# Patient Record
Sex: Male | Born: 1935
Health system: Southern US, Community
[De-identification: ages and names within clinical notes are randomized; demographics above are authoritative.]

## PROBLEM LIST (undated history)

## (undated) DIAGNOSIS — C4492 Squamous cell carcinoma of skin, unspecified: Secondary | ICD-10-CM

## (undated) DIAGNOSIS — R011 Cardiac murmur, unspecified: Secondary | ICD-10-CM

## (undated) DIAGNOSIS — M25552 Pain in left hip: Secondary | ICD-10-CM

## (undated) DIAGNOSIS — I350 Nonrheumatic aortic (valve) stenosis: Secondary | ICD-10-CM

## (undated) DIAGNOSIS — C44310 Basal cell carcinoma of skin of unspecified parts of face: Secondary | ICD-10-CM

## (undated) DIAGNOSIS — Z87442 Personal history of urinary calculi: Secondary | ICD-10-CM

## (undated) DIAGNOSIS — M1990 Unspecified osteoarthritis, unspecified site: Secondary | ICD-10-CM

## (undated) DIAGNOSIS — I2510 Atherosclerotic heart disease of native coronary artery without angina pectoris: Secondary | ICD-10-CM

## (undated) DIAGNOSIS — M545 Low back pain, unspecified: Secondary | ICD-10-CM

## (undated) DIAGNOSIS — C4491 Basal cell carcinoma of skin, unspecified: Secondary | ICD-10-CM

## (undated) DIAGNOSIS — R0600 Dyspnea, unspecified: Secondary | ICD-10-CM

## (undated) HISTORY — DX: Low back pain, unspecified: M54.50

## (undated) HISTORY — DX: Basal cell carcinoma of skin of unspecified parts of face: C44.310

## (undated) HISTORY — DX: Cardiac murmur, unspecified: R01.1

## (undated) HISTORY — PX: EYE SURGERY: SHX253

## (undated) HISTORY — DX: Low back pain: M54.5

## (undated) HISTORY — DX: Atherosclerotic heart disease of native coronary artery without angina pectoris: I25.10

## (undated) HISTORY — DX: Nonrheumatic aortic (valve) stenosis: I35.0

## (undated) HISTORY — PX: JOINT REPLACEMENT: SHX530

## (undated) HISTORY — DX: Pain in left hip: M25.552

---
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
## 1989-04-07 DIAGNOSIS — C4491 Basal cell carcinoma of skin, unspecified: Secondary | ICD-10-CM

## 1989-04-07 HISTORY — DX: Basal cell carcinoma of skin, unspecified: C44.91

## 2005-03-26 ENCOUNTER — Encounter: Admission: RE | Admit: 2005-03-26 | Discharge: 2005-03-26 | Payer: Self-pay | Admitting: Neurosurgery

## 2010-10-28 ENCOUNTER — Ambulatory Visit (HOSPITAL_COMMUNITY)
Admission: RE | Admit: 2010-10-28 | Discharge: 2010-10-28 | Payer: Self-pay | Source: Home / Self Care | Attending: Ophthalmology | Admitting: Ophthalmology

## 2010-11-30 ENCOUNTER — Ambulatory Visit (HOSPITAL_COMMUNITY)
Admission: RE | Admit: 2010-11-30 | Discharge: 2010-11-30 | Payer: Self-pay | Source: Home / Self Care | Attending: Ophthalmology | Admitting: Ophthalmology

## 2011-01-18 LAB — BASIC METABOLIC PANEL
BUN: 22 mg/dL (ref 6–23)
Chloride: 107 mEq/L (ref 96–112)
GFR calc non Af Amer: >60 mL/min (ref >60)
Glucose, Bld: 101 mg/dL — ABNORMAL HIGH (ref 70–99)
Potassium: 4.3 mEq/L (ref 3.5–5.1)
Sodium: 139 mEq/L (ref 135–145)

## 2011-01-18 LAB — HEMOGLOBIN AND HEMATOCRIT, BLOOD: Hemoglobin: 14.1 g/dL (ref 13.0–17.0)

## 2011-07-20 DIAGNOSIS — C4492 Squamous cell carcinoma of skin, unspecified: Secondary | ICD-10-CM

## 2011-07-20 DIAGNOSIS — C4491 Basal cell carcinoma of skin, unspecified: Secondary | ICD-10-CM

## 2011-07-20 HISTORY — DX: Squamous cell carcinoma of skin, unspecified: C44.92

## 2011-07-20 HISTORY — DX: Basal cell carcinoma of skin, unspecified: C44.91

## 2013-11-08 HISTORY — PX: BACK SURGERY: SHX140

## 2017-01-19 DIAGNOSIS — R350 Frequency of micturition: Secondary | ICD-10-CM | POA: Diagnosis not present

## 2017-01-19 DIAGNOSIS — Z87891 Personal history of nicotine dependence: Secondary | ICD-10-CM | POA: Diagnosis not present

## 2017-01-19 DIAGNOSIS — M79606 Pain in leg, unspecified: Secondary | ICD-10-CM | POA: Diagnosis not present

## 2017-01-19 DIAGNOSIS — Z299 Encounter for prophylactic measures, unspecified: Secondary | ICD-10-CM | POA: Diagnosis not present

## 2017-01-19 DIAGNOSIS — R0602 Shortness of breath: Secondary | ICD-10-CM | POA: Diagnosis not present

## 2017-01-19 DIAGNOSIS — Z2821 Immunization not carried out because of patient refusal: Secondary | ICD-10-CM | POA: Diagnosis not present

## 2017-01-19 DIAGNOSIS — Z6829 Body mass index (BMI) 29.0-29.9, adult: Secondary | ICD-10-CM | POA: Diagnosis not present

## 2017-01-19 DIAGNOSIS — R079 Chest pain, unspecified: Secondary | ICD-10-CM | POA: Diagnosis not present

## 2017-01-19 DIAGNOSIS — I10 Essential (primary) hypertension: Secondary | ICD-10-CM | POA: Diagnosis not present

## 2017-01-19 DIAGNOSIS — E7800 Pure hypercholesterolemia, unspecified: Secondary | ICD-10-CM | POA: Diagnosis not present

## 2017-01-19 DIAGNOSIS — J449 Chronic obstructive pulmonary disease, unspecified: Secondary | ICD-10-CM | POA: Diagnosis not present

## 2017-01-19 DIAGNOSIS — W19XXXA Unspecified fall, initial encounter: Secondary | ICD-10-CM | POA: Diagnosis not present

## 2017-01-19 DIAGNOSIS — R319 Hematuria, unspecified: Secondary | ICD-10-CM | POA: Diagnosis not present

## 2017-01-19 DIAGNOSIS — Z713 Dietary counseling and surveillance: Secondary | ICD-10-CM | POA: Diagnosis not present

## 2017-01-19 DIAGNOSIS — I700 Atherosclerosis of aorta: Secondary | ICD-10-CM | POA: Diagnosis not present

## 2017-02-14 DIAGNOSIS — N281 Cyst of kidney, acquired: Secondary | ICD-10-CM | POA: Diagnosis not present

## 2017-02-14 DIAGNOSIS — R319 Hematuria, unspecified: Secondary | ICD-10-CM | POA: Diagnosis not present

## 2017-02-21 DIAGNOSIS — Z6828 Body mass index (BMI) 28.0-28.9, adult: Secondary | ICD-10-CM | POA: Diagnosis not present

## 2017-02-21 DIAGNOSIS — E7800 Pure hypercholesterolemia, unspecified: Secondary | ICD-10-CM | POA: Diagnosis not present

## 2017-02-21 DIAGNOSIS — I10 Essential (primary) hypertension: Secondary | ICD-10-CM | POA: Diagnosis not present

## 2017-02-21 DIAGNOSIS — R079 Chest pain, unspecified: Secondary | ICD-10-CM | POA: Diagnosis not present

## 2017-02-21 DIAGNOSIS — Z299 Encounter for prophylactic measures, unspecified: Secondary | ICD-10-CM | POA: Diagnosis not present

## 2017-06-16 DIAGNOSIS — I10 Essential (primary) hypertension: Secondary | ICD-10-CM | POA: Diagnosis not present

## 2017-06-16 DIAGNOSIS — Z6827 Body mass index (BMI) 27.0-27.9, adult: Secondary | ICD-10-CM | POA: Diagnosis not present

## 2017-06-16 DIAGNOSIS — R350 Frequency of micturition: Secondary | ICD-10-CM | POA: Diagnosis not present

## 2017-06-16 DIAGNOSIS — Z299 Encounter for prophylactic measures, unspecified: Secondary | ICD-10-CM | POA: Diagnosis not present

## 2017-06-16 DIAGNOSIS — R319 Hematuria, unspecified: Secondary | ICD-10-CM | POA: Diagnosis not present

## 2017-06-16 DIAGNOSIS — E7800 Pure hypercholesterolemia, unspecified: Secondary | ICD-10-CM | POA: Diagnosis not present

## 2017-07-15 DIAGNOSIS — R5383 Other fatigue: Secondary | ICD-10-CM | POA: Diagnosis not present

## 2017-07-15 DIAGNOSIS — E7800 Pure hypercholesterolemia, unspecified: Secondary | ICD-10-CM | POA: Diagnosis not present

## 2017-07-15 DIAGNOSIS — Z79899 Other long term (current) drug therapy: Secondary | ICD-10-CM | POA: Diagnosis not present

## 2017-07-15 DIAGNOSIS — Z299 Encounter for prophylactic measures, unspecified: Secondary | ICD-10-CM | POA: Diagnosis not present

## 2017-07-15 DIAGNOSIS — I519 Heart disease, unspecified: Secondary | ICD-10-CM | POA: Diagnosis not present

## 2017-07-15 DIAGNOSIS — Z6827 Body mass index (BMI) 27.0-27.9, adult: Secondary | ICD-10-CM | POA: Diagnosis not present

## 2017-07-15 DIAGNOSIS — Z7189 Other specified counseling: Secondary | ICD-10-CM | POA: Diagnosis not present

## 2017-07-15 DIAGNOSIS — Z1389 Encounter for screening for other disorder: Secondary | ICD-10-CM | POA: Diagnosis not present

## 2017-07-15 DIAGNOSIS — Z125 Encounter for screening for malignant neoplasm of prostate: Secondary | ICD-10-CM | POA: Diagnosis not present

## 2017-07-15 DIAGNOSIS — Z0000 Encounter for general adult medical examination without abnormal findings: Secondary | ICD-10-CM | POA: Diagnosis not present

## 2017-07-15 DIAGNOSIS — I10 Essential (primary) hypertension: Secondary | ICD-10-CM | POA: Diagnosis not present

## 2017-08-28 DIAGNOSIS — R9431 Abnormal electrocardiogram [ECG] [EKG]: Secondary | ICD-10-CM | POA: Diagnosis not present

## 2017-08-28 DIAGNOSIS — I824Z2 Acute embolism and thrombosis of unspecified deep veins of left distal lower extremity: Secondary | ICD-10-CM | POA: Diagnosis not present

## 2017-08-28 DIAGNOSIS — Z87891 Personal history of nicotine dependence: Secondary | ICD-10-CM | POA: Diagnosis not present

## 2017-08-28 DIAGNOSIS — R609 Edema, unspecified: Secondary | ICD-10-CM | POA: Diagnosis not present

## 2017-08-31 DIAGNOSIS — M545 Low back pain: Secondary | ICD-10-CM | POA: Diagnosis not present

## 2017-08-31 DIAGNOSIS — M160 Bilateral primary osteoarthritis of hip: Secondary | ICD-10-CM | POA: Diagnosis not present

## 2017-08-31 DIAGNOSIS — G5731 Lesion of lateral popliteal nerve, right lower limb: Secondary | ICD-10-CM | POA: Diagnosis not present

## 2017-08-31 DIAGNOSIS — M4716 Other spondylosis with myelopathy, lumbar region: Secondary | ICD-10-CM | POA: Diagnosis not present

## 2017-08-31 DIAGNOSIS — Z9889 Other specified postprocedural states: Secondary | ICD-10-CM | POA: Diagnosis not present

## 2017-10-05 DIAGNOSIS — D229 Melanocytic nevi, unspecified: Secondary | ICD-10-CM | POA: Diagnosis not present

## 2017-10-05 DIAGNOSIS — D692 Other nonthrombocytopenic purpura: Secondary | ICD-10-CM | POA: Diagnosis not present

## 2017-10-05 DIAGNOSIS — L570 Actinic keratosis: Secondary | ICD-10-CM | POA: Diagnosis not present

## 2017-11-30 DIAGNOSIS — I519 Heart disease, unspecified: Secondary | ICD-10-CM | POA: Diagnosis not present

## 2017-11-30 DIAGNOSIS — I10 Essential (primary) hypertension: Secondary | ICD-10-CM | POA: Diagnosis not present

## 2017-11-30 DIAGNOSIS — Z87891 Personal history of nicotine dependence: Secondary | ICD-10-CM | POA: Diagnosis not present

## 2017-11-30 DIAGNOSIS — N419 Inflammatory disease of prostate, unspecified: Secondary | ICD-10-CM | POA: Diagnosis not present

## 2017-11-30 DIAGNOSIS — R350 Frequency of micturition: Secondary | ICD-10-CM | POA: Diagnosis not present

## 2017-11-30 DIAGNOSIS — Z299 Encounter for prophylactic measures, unspecified: Secondary | ICD-10-CM | POA: Diagnosis not present

## 2017-11-30 DIAGNOSIS — Z6828 Body mass index (BMI) 28.0-28.9, adult: Secondary | ICD-10-CM | POA: Diagnosis not present

## 2017-12-20 DIAGNOSIS — Z6828 Body mass index (BMI) 28.0-28.9, adult: Secondary | ICD-10-CM | POA: Diagnosis not present

## 2017-12-20 DIAGNOSIS — M25562 Pain in left knee: Secondary | ICD-10-CM | POA: Diagnosis not present

## 2017-12-20 DIAGNOSIS — Z299 Encounter for prophylactic measures, unspecified: Secondary | ICD-10-CM | POA: Diagnosis not present

## 2017-12-20 DIAGNOSIS — E7800 Pure hypercholesterolemia, unspecified: Secondary | ICD-10-CM | POA: Diagnosis not present

## 2017-12-20 DIAGNOSIS — Z2821 Immunization not carried out because of patient refusal: Secondary | ICD-10-CM | POA: Diagnosis not present

## 2017-12-20 DIAGNOSIS — M545 Low back pain: Secondary | ICD-10-CM | POA: Diagnosis not present

## 2018-01-09 DIAGNOSIS — M545 Low back pain: Secondary | ICD-10-CM | POA: Diagnosis not present

## 2018-01-09 DIAGNOSIS — Z299 Encounter for prophylactic measures, unspecified: Secondary | ICD-10-CM | POA: Diagnosis not present

## 2018-01-09 DIAGNOSIS — Z6828 Body mass index (BMI) 28.0-28.9, adult: Secondary | ICD-10-CM | POA: Diagnosis not present

## 2018-01-09 DIAGNOSIS — M25552 Pain in left hip: Secondary | ICD-10-CM | POA: Diagnosis not present

## 2018-01-09 DIAGNOSIS — R350 Frequency of micturition: Secondary | ICD-10-CM | POA: Diagnosis not present

## 2018-01-09 DIAGNOSIS — I10 Essential (primary) hypertension: Secondary | ICD-10-CM | POA: Diagnosis not present

## 2018-01-09 DIAGNOSIS — I5189 Other ill-defined heart diseases: Secondary | ICD-10-CM | POA: Diagnosis not present

## 2018-02-16 DIAGNOSIS — M160 Bilateral primary osteoarthritis of hip: Secondary | ICD-10-CM | POA: Diagnosis not present

## 2018-02-16 DIAGNOSIS — M25552 Pain in left hip: Secondary | ICD-10-CM | POA: Diagnosis not present

## 2018-02-20 DIAGNOSIS — I10 Essential (primary) hypertension: Secondary | ICD-10-CM | POA: Diagnosis not present

## 2018-02-20 DIAGNOSIS — Z6828 Body mass index (BMI) 28.0-28.9, adult: Secondary | ICD-10-CM | POA: Diagnosis not present

## 2018-02-20 DIAGNOSIS — Z299 Encounter for prophylactic measures, unspecified: Secondary | ICD-10-CM | POA: Diagnosis not present

## 2018-02-20 DIAGNOSIS — I519 Heart disease, unspecified: Secondary | ICD-10-CM | POA: Diagnosis not present

## 2018-02-20 DIAGNOSIS — M25552 Pain in left hip: Secondary | ICD-10-CM | POA: Diagnosis not present

## 2018-03-20 DIAGNOSIS — Z299 Encounter for prophylactic measures, unspecified: Secondary | ICD-10-CM | POA: Diagnosis not present

## 2018-03-20 DIAGNOSIS — M1712 Unilateral primary osteoarthritis, left knee: Secondary | ICD-10-CM | POA: Diagnosis not present

## 2018-03-20 DIAGNOSIS — E7800 Pure hypercholesterolemia, unspecified: Secondary | ICD-10-CM | POA: Diagnosis not present

## 2018-03-20 DIAGNOSIS — R319 Hematuria, unspecified: Secondary | ICD-10-CM | POA: Diagnosis not present

## 2018-03-20 DIAGNOSIS — I10 Essential (primary) hypertension: Secondary | ICD-10-CM | POA: Diagnosis not present

## 2018-03-20 DIAGNOSIS — Z6828 Body mass index (BMI) 28.0-28.9, adult: Secondary | ICD-10-CM | POA: Diagnosis not present

## 2018-04-22 DIAGNOSIS — K922 Gastrointestinal hemorrhage, unspecified: Secondary | ICD-10-CM | POA: Diagnosis not present

## 2018-04-22 DIAGNOSIS — N281 Cyst of kidney, acquired: Secondary | ICD-10-CM | POA: Diagnosis not present

## 2018-04-22 DIAGNOSIS — K921 Melena: Secondary | ICD-10-CM | POA: Diagnosis not present

## 2018-04-22 DIAGNOSIS — Z87891 Personal history of nicotine dependence: Secondary | ICD-10-CM | POA: Diagnosis not present

## 2018-04-23 DIAGNOSIS — N281 Cyst of kidney, acquired: Secondary | ICD-10-CM | POA: Diagnosis not present

## 2018-04-24 ENCOUNTER — Other Ambulatory Visit: Payer: Self-pay

## 2018-04-24 DIAGNOSIS — Z299 Encounter for prophylactic measures, unspecified: Secondary | ICD-10-CM | POA: Diagnosis not present

## 2018-04-24 DIAGNOSIS — I10 Essential (primary) hypertension: Secondary | ICD-10-CM | POA: Diagnosis not present

## 2018-04-24 DIAGNOSIS — K625 Hemorrhage of anus and rectum: Secondary | ICD-10-CM | POA: Diagnosis not present

## 2018-04-24 DIAGNOSIS — Z6828 Body mass index (BMI) 28.0-28.9, adult: Secondary | ICD-10-CM | POA: Diagnosis not present

## 2018-04-24 DIAGNOSIS — E7800 Pure hypercholesterolemia, unspecified: Secondary | ICD-10-CM | POA: Diagnosis not present

## 2018-04-24 NOTE — Patient Outreach (Signed)
[facility]) Care Management  04/24/2018  Corey Aguilar 11/02/1934 [id number]   Referral Date: Referral Source: Referral Reason:   Outreach Attempt #1 Telephone call to patient for nurse line follow up.  Spoke with patient.  He is able to verify HIPAA.  Discussed with patient reason for call.  He states he did go to the ER and test were done but nothing showed. He states the recommendation was for further testing.  Patient says that the rectal bleeding stopped on yesterday and that he has an appointment with PCP today at 1:30 pm.  Patient declines any needs or questions at this time.     Plan: RN CM will send letter and brochure for future reference.   RN CM will close case.   Jone Baseman, RN, MSN [facility] Care Management Care Management Coordinator Direct Line [phone] Toll Free: [phone]  Fax: [phone]

## 2018-05-02 ENCOUNTER — Encounter: Payer: Self-pay | Admitting: Internal Medicine

## 2018-05-10 ENCOUNTER — Ambulatory Visit (INDEPENDENT_AMBULATORY_CARE_PROVIDER_SITE_OTHER): Payer: PPO | Admitting: Urology

## 2018-05-10 ENCOUNTER — Other Ambulatory Visit (HOSPITAL_COMMUNITY)
Admission: AD | Admit: 2018-05-10 | Discharge: 2018-05-10 | Disposition: A | Discharge status: Home / Self Care | Payer: PPO | Source: Other Acute Inpatient Hospital | Attending: Urology | Admitting: Urology

## 2018-05-10 DIAGNOSIS — R3121 Asymptomatic microscopic hematuria: Secondary | ICD-10-CM | POA: Insufficient documentation

## 2018-05-10 DIAGNOSIS — R310 Gross hematuria: Secondary | ICD-10-CM | POA: Diagnosis not present

## 2018-05-10 LAB — URINALYSIS, ROUTINE W REFLEX MICROSCOPIC
BACTERIA UA: NONE SEEN
BILIRUBIN URINE: NEGATIVE
GLUCOSE, UA: NEGATIVE mg/dL
Ketones, ur: NEGATIVE mg/dL
LEUKOCYTES UA: NEGATIVE
NITRITE: NEGATIVE
PROTEIN: NEGATIVE mg/dL
SPECIFIC GRAVITY, URINE: 1.027 (ref 1.005–1.030)
pH: 5 (ref 5.0–8.0)

## 2018-05-16 ENCOUNTER — Other Ambulatory Visit: Payer: Self-pay | Admitting: Urology

## 2018-05-16 DIAGNOSIS — R310 Gross hematuria: Secondary | ICD-10-CM

## 2018-06-01 ENCOUNTER — Ambulatory Visit (HOSPITAL_COMMUNITY)
Admission: RE | Admit: 2018-06-01 | Discharge: 2018-06-01 | Disposition: A | Discharge status: Home / Self Care | Payer: PPO | Source: Ambulatory Visit | Attending: Urology | Admitting: Urology

## 2018-06-01 DIAGNOSIS — I358 Other nonrheumatic aortic valve disorders: Secondary | ICD-10-CM | POA: Insufficient documentation

## 2018-06-01 DIAGNOSIS — I723 Aneurysm of iliac artery: Secondary | ICD-10-CM | POA: Diagnosis not present

## 2018-06-01 DIAGNOSIS — I700 Atherosclerosis of aorta: Secondary | ICD-10-CM | POA: Insufficient documentation

## 2018-06-01 DIAGNOSIS — R310 Gross hematuria: Secondary | ICD-10-CM

## 2018-06-01 DIAGNOSIS — R932 Abnormal findings on diagnostic imaging of liver and biliary tract: Secondary | ICD-10-CM | POA: Insufficient documentation

## 2018-06-01 DIAGNOSIS — I2510 Atherosclerotic heart disease of native coronary artery without angina pectoris: Secondary | ICD-10-CM | POA: Insufficient documentation

## 2018-06-01 LAB — POCT I-STAT CREATININE: CREATININE: 0.9 mg/dL (ref 0.61–1.24)

## 2018-06-01 MED ORDER — IOPAMIDOL (ISOVUE-300) INJECTION 61%: 100.0000 mL | Freq: Once | INTRAVENOUS | Status: DC | PRN

## 2018-06-01 MED ORDER — IOPAMIDOL (ISOVUE-300) INJECTION 61%
150.0000 mL | Freq: Once | INTRAVENOUS | Status: AC | PRN
  Administered 2018-06-01: 125 mL via INTRAVENOUS

## 2018-06-12 DIAGNOSIS — M160 Bilateral primary osteoarthritis of hip: Secondary | ICD-10-CM | POA: Diagnosis not present

## 2018-06-12 DIAGNOSIS — M170 Bilateral primary osteoarthritis of knee: Secondary | ICD-10-CM | POA: Diagnosis not present

## 2018-06-28 ENCOUNTER — Ambulatory Visit (INDEPENDENT_AMBULATORY_CARE_PROVIDER_SITE_OTHER): Payer: PPO | Admitting: Urology

## 2018-06-28 DIAGNOSIS — R310 Gross hematuria: Secondary | ICD-10-CM

## 2018-06-29 DIAGNOSIS — M170 Bilateral primary osteoarthritis of knee: Secondary | ICD-10-CM | POA: Diagnosis not present

## 2018-06-29 DIAGNOSIS — M160 Bilateral primary osteoarthritis of hip: Secondary | ICD-10-CM | POA: Diagnosis not present

## 2018-07-18 ENCOUNTER — Ambulatory Visit (INDEPENDENT_AMBULATORY_CARE_PROVIDER_SITE_OTHER): Payer: PPO | Admitting: General Surgery

## 2018-07-18 ENCOUNTER — Encounter: Payer: Self-pay | Admitting: General Surgery

## 2018-07-18 VITALS — BP 126/81 | HR 65 | Temp 98.6°F | Resp 18 | Wt 179.0 lb

## 2018-07-18 DIAGNOSIS — K4090 Unilateral inguinal hernia, without obstruction or gangrene, not specified as recurrent: Secondary | ICD-10-CM

## 2018-07-18 NOTE — Progress Notes (Signed)
Corey Aguilar; [id number]; 1934/09/27   HPI Patient is an 84 year old white male who was referred to my care by Dr. Alyson Ingles for evaluation treatment of left inguinal hernia.  This was found on physical examination for work-up of hematuria.  Patient states that he rarely has left groin pain.  He has no radiation of any left groin pain to the left testicle.  No nausea or vomiting have been noted.  He is more concerned about his lower back pain that he has had for years since undergoing back surgery.  He states that he stays active and denies any limitations with the hernia.  He has not noticed a lump in the left groin region.  He currently has 0 out of 10 left groin pain. History reviewed. No pertinent past medical history.  History reviewed. No pertinent surgical history.  History reviewed. No pertinent family history.  No current outpatient medications on file prior to visit.   No current facility-administered medications on file prior to visit.     Allergies  Allergen Reactions  . Penicillins     Social History   Substance and Sexual Activity  Alcohol Use Not on file    Social History   Tobacco Use  Smoking Status Former Smoker  Smokeless Tobacco Never Used    Review of Systems  Constitutional: Negative.   HENT: Negative.   Eyes: Negative.   Respiratory: Negative.   Cardiovascular: Negative.   Gastrointestinal: Negative.   Genitourinary: Positive for frequency.  Musculoskeletal: Positive for back pain and joint pain.  Skin: Negative.   Neurological: Negative.   Endo/Heme/Allergies: Negative.   Psychiatric/Behavioral: Negative.     Objective   Vitals:   07/18/18 1021  BP: 126/81  Pulse: 65  Resp: 18  Temp: 98.6 F (37 C)    Physical Exam  Constitutional: He is oriented to person, place, and time. He appears well-developed and well-nourished. No distress.  HENT:  Head: Normocephalic and atraumatic.  Cardiovascular: Normal rate and regular rhythm. Exam  reveals no gallop and no friction rub.  Murmur heard. 2 out of 6 systolic ejection murmur  Pulmonary/Chest: Effort normal and breath sounds normal. No stridor. No respiratory distress. He has no wheezes. He has no rales.  Abdominal: Soft. Bowel sounds are normal. He exhibits no distension and no mass. There is no tenderness. There is no guarding. A hernia is present.  Laxity over the left inguinal internal ring with a small hernia present, easily reducible.  Neurological: He is alert and oriented to person, place, and time.  Skin: Skin is warm and dry.  Vitals reviewed.  Dr. Noland Fordyce notes reviewed Assessment  Left inguinal hernia, asymptomatic Plan   I told the patient that his left inguinal hernia is not related to his back pain.  This is more secondary to his previous back surgery.  As he is asymptomatic, I would not recommend repair at this time.  Should he become symptomatic, patient was instructed to follow-up with my office.  He understands and agrees.  Follow-up expectantly.

## 2018-07-18 NOTE — Patient Instructions (Signed)

## 2018-07-28 ENCOUNTER — Encounter: Payer: Self-pay | Admitting: Gastroenterology

## 2018-07-28 ENCOUNTER — Ambulatory Visit (INDEPENDENT_AMBULATORY_CARE_PROVIDER_SITE_OTHER): Payer: PPO | Admitting: Gastroenterology

## 2018-07-28 DIAGNOSIS — K625 Hemorrhage of anus and rectum: Secondary | ICD-10-CM

## 2018-07-28 DIAGNOSIS — K5900 Constipation, unspecified: Secondary | ICD-10-CM | POA: Diagnosis not present

## 2018-07-28 MED ORDER — POLYETHYLENE GLYCOL 3350 17 GM/SCOOP PO POWD
ORAL | 0 refills | Status: DC
Start: 2018-07-28 — End: 2019-05-30

## 2018-07-28 NOTE — Patient Instructions (Signed)
1. Start Miralax for constipation. You can take a capful mixed in 4-6 ounces of liquid twice a day until you have a soft stool. After that, you should take one capful every morning if you have not had GOOD bowel movement the day before.  2. Call if you have ongoing problems with constipation or if you have recurrent bleeding.  3. We will hold off on colonoscopy right now per your request.

## 2018-07-28 NOTE — Assessment & Plan Note (Signed)
Begin MiraLAX 17 g twice daily until soft stool, then continue daily as needed.  Encouraged him to take 1 dose if he has not had a good bowel movement within a 24-hour period of time.  He will stop frequent Ex-Lax, may continue on occasion if needed.  With regards to recent rectal bleeding, suspect he had a diverticular bleed.  He has never had a colonoscopy.  In the past he has declined.  At this time he does not want to pursue a colonoscopy.  We discussed that we cannot exclude stability malignancy or other life-threatening etiologies without a colonoscopy.  He voiced understanding.  He will call if he has any questions or concerns.  We have requested records from [facility] ER visit specially to look for anemia/IDA.

## 2018-07-28 NOTE — Progress Notes (Signed)
Primary Care Physician:  Monico Blitz, MD  Primary Gastroenterologist:  Garfield Cornea, MD   Chief Complaint  Patient presents with  . Rectal Bleeding    last happened couple months ago    HPI:  Corey Aguilar is a 84 y.o. male here at the request of Dr. Manuella Ghazi for further evaluation of rectal bleeding.  He had an episode 3 months ago when he developed acute onset bloody stools, no abdominal pain.  Several episodes over 24-hour period of time.  He went to the emergency department at [facility].  He reports having labs in the CT scan.  CT showed extensive diverticulosis in the distal colon, no bowel wall thickening, mild hyperdensity within the distal sigmoid colon, adjacent to the bowel wall question previously ingested radiopaque material versus small amount of hemorrhagic material within the bowel.  He states that they offered admission since they had no GI on board he decided to go home.  He has had no further bleeding since that time.  He does have chronic constipation which she is more interested in addressing today.  He has never had a colonoscopy, declined previously.  He has had gross hematuria, saw urologist Dr. Alyson Ingles.  Another CT performed, detailed low.  Patient reports cystoscopy was okay as well.  He has chronic left hip and back pain has received multiple injections with little relief.  Follows with orthopedic, Dr. Case and with his neurosurgeon Dr. Carloyn Manner.  Regarding constipation, has been more of a problem with the past 5 years ever since his back surgery.  May go a few days, then takes Ex-Lax, has a few days with good bowel movements but then will skip several days again until he takes another Ex-Lax.  He denies any upper GI symptoms.  For his back and hip pain he takes 1 Goody powders at bedtime at the most.  He does not take it daily.   Current Outpatient Medications  Medication Sig Dispense Refill  . Aspirin-Acetaminophen-Caffeine (GOODYS EXTRA STRENGTH PO) Take by mouth  as needed.     No current facility-administered medications for this visit.     Allergies as of 07/28/2018 - Review Complete 07/28/2018  Allergen Reaction Noted  . Penicillins  07/18/2018    Past Medical History:  Diagnosis Date  . Facial basal cell cancer   . Left hip pain   . Lumbago     Past Surgical History:  Procedure Laterality Date  . BACK SURGERY  2015    Family History  Problem Relation Age of Onset  . Colon cancer Neg Hx     Social History   Socioeconomic History  . Marital status: Married    Spouse name: Not on file  . Number of children: Not on file  . Years of education: Not on file  . Highest education level: Not on file  Occupational History  . Not on file  Social Needs  . Financial resource strain: Not on file  . Food insecurity:    Worry: Not on file    Inability: Not on file  . Transportation needs:    Medical: Not on file    Non-medical: Not on file  Tobacco Use  . Smoking status: Former Smoker    Last attempt to quit: 1957    Years since quitting: 62.7  . Smokeless tobacco: Never Used  Substance and Sexual Activity  . Alcohol use: Yes    Comment: occasional wine with a meal  . Drug use: Never  . Sexual activity:  Not on file  Lifestyle  . Physical activity:    Days per week: Not on file    Minutes per session: Not on file  . Stress: Not on file  Relationships  . Social connections:    Talks on phone: Not on file    Gets together: Not on file    Attends religious service: Not on file    Active member of club or organization: Not on file    Attends meetings of clubs or organizations: Not on file    Relationship status: Not on file  . Intimate partner violence:    Fear of current or ex partner: Not on file    Emotionally abused: Not on file    Physically abused: Not on file    Forced sexual activity: Not on file  Other Topics Concern  . Not on file  Social History Narrative   3 grown children.       Patient has limited  literacy.       ROS:  General: Negative for anorexia, weight loss, fever, chills, fatigue, weakness. Eyes: Negative for vision changes.  ENT: Negative for hoarseness, difficulty swallowing , nasal congestion. CV: Negative for chest pain, angina, palpitations, dyspnea on exertion, peripheral edema.  Respiratory: Negative for dyspnea at rest, dyspnea on exertion, cough, sputum, wheezing.  GI: See history of present illness. GU:  Negative for dysuria, urinary incontinence, urinary frequency, nocturnal urination.  See HPI MS: See HPI  Derm: Negative for rash or itching.  Neuro: Negative for weakness, abnormal sensation, seizure, frequent headaches, memory loss, confusion.  Psych: Negative for anxiety, depression, suicidal ideation, hallucinations.  Endo: Negative for unusual weight change.  Heme: Negative for bruising or bleeding. Allergy: Negative for rash or hives.    Physical Examination:  BP 131/79   Pulse 70   Temp 97.6 F (36.4 C) (Oral)   Ht 5\' 7"  (1.702 m)   Wt 167 lb 6.4 oz (75.9 kg)   BMI 26.22 kg/m    General: Well-nourished, well-developed in no acute distress.  Head: Normocephalic, atraumatic.   Eyes: Conjunctiva pink, no icterus. Mouth: Oropharyngeal mucosa moist and pink , no lesions erythema or exudate. Neck: Supple without thyromegaly, masses, or lymphadenopathy.  Lungs: Clear to auscultation bilaterally.  Heart: Regular rate and rhythm, no rubs or gallops.  3 out of 6 systolic ejection murmur Abdomen: Bowel sounds are normal, nontender, nondistended, no hepatosplenomegaly or masses, no abdominal bruits or    hernia , no rebound or guarding.   Rectal: Not performed Extremities: No lower extremity edema. No clubbing or deformities.  Neuro: Alert and oriented x 4 , grossly normal neurologically.  Skin: Warm and dry, no rash or jaundice.   Psych: Alert and cooperative, normal mood and affect.      Imaging Studies:   CLINICAL DATA:  Gross hematuria.   Weakness.  Abdominal pain.  EXAM: CT ABDOMEN AND PELVIS WITHOUT AND WITH CONTRAST  TECHNIQUE: Multidetector CT imaging of the abdomen and pelvis was performed following the standard protocol before and following the bolus administration of intravenous contrast.  CONTRAST:  152mL ISOVUE-300 IOPAMIDOL (ISOVUE-300) INJECTION 61%  COMPARISON:  04/23/2018  FINDINGS: Lower chest: Right hemidiaphragm elevation. A subpleural 5 mm right lower lobe pulmonary nodule is similar to 08/06/2014 and can be presumed benign. Mild cardiomegaly with multivessel coronary artery atherosclerosis. Aortic valve calcification.  Hepatobiliary: A too small to characterize high left hepatic lobe lesion is likely a cyst. There is vague hyperenhancement involving the high left hepatic  lobe at 2.2 cm on 14/7. This is present on the prior, favored to represent a portal to hepatic venous shunt. Normal gallbladder, without biliary ductal dilatation.  Pancreas: Fatty atrophy/replacement involving the pancreas. No duct dilatation or dominant mass.  Spleen: Normal in size, without focal abnormality.  Adrenals/Urinary Tract: Normal adrenal glands. No renal calculi or hydronephrosis. There is no hydroureter. The ureters are somewhat difficult to follow, but no ureteric calculi are seen. No bladder calculi. No suspicious renal mass on post-contrast imaging. Partially exophytic interpolar left renal 2.2 cm cyst. Bilateral renal sinus cysts. Too small to characterize lower pole left renal lesion. Moderate renal collecting system opacification on delayed images. Good ureteric opacification, without filling defect.  No enhancing bladder mass. The bladder is only partially filled with contrast on delayed images. No posterior filling defect.  Stomach/Bowel: Normal stomach, without wall thickening. Extensive colonic diverticulosis. Normal terminal ileum and appendix. Normal small  bowel.  Vascular/Lymphatic: Advanced aortic and branch vessel atherosclerosis. Left common iliac artery fusiform aneurysm including at 2.1 cm. The right common iliac is ectatic at 1.8 cm. These are similar to on the recent exam. No abdominopelvic adenopathy.  Reproductive: Normal prostate.  Other: No significant free fluid. Tiny fat containing right inguinal hernia.  Musculoskeletal: Bilateral hip osteoarthritis. Degenerative partial fusion of the left sacroiliac joint. Lumbosacral spine fixation, with resultant beam hardening artifact.  IMPRESSION: 1.  No acute process or explanation for hematuria. 2. High left hepatic lobe hyperenhancing focus is favored to represent a portal to hepatic venous shunt/fistula. 3. Aortic Atherosclerosis (ICD10-I70.0). Left greater than right common iliac artery dilatation. 4. Coronary artery atherosclerosis. 5. Aortic valvular calcifications. Consider echocardiography to evaluate for valvular dysfunction.   Electronically Signed   By: Abigail Miyamoto M.D.   On: 06/02/2018 09:24

## 2018-07-31 NOTE — Progress Notes (Signed)
CC'D TO PCP °

## 2018-08-22 DIAGNOSIS — C44329 Squamous cell carcinoma of skin of other parts of face: Secondary | ICD-10-CM | POA: Diagnosis not present

## 2018-08-22 DIAGNOSIS — L821 Other seborrheic keratosis: Secondary | ICD-10-CM | POA: Diagnosis not present

## 2018-08-22 DIAGNOSIS — L720 Epidermal cyst: Secondary | ICD-10-CM | POA: Diagnosis not present

## 2018-08-22 DIAGNOSIS — L570 Actinic keratosis: Secondary | ICD-10-CM | POA: Diagnosis not present

## 2018-08-22 DIAGNOSIS — D229 Melanocytic nevi, unspecified: Secondary | ICD-10-CM | POA: Diagnosis not present

## 2018-09-01 DIAGNOSIS — Z6826 Body mass index (BMI) 26.0-26.9, adult: Secondary | ICD-10-CM | POA: Diagnosis not present

## 2018-09-01 DIAGNOSIS — Z299 Encounter for prophylactic measures, unspecified: Secondary | ICD-10-CM | POA: Diagnosis not present

## 2018-09-01 DIAGNOSIS — M25552 Pain in left hip: Secondary | ICD-10-CM | POA: Diagnosis not present

## 2018-09-01 DIAGNOSIS — I10 Essential (primary) hypertension: Secondary | ICD-10-CM | POA: Diagnosis not present

## 2018-09-01 DIAGNOSIS — Z2821 Immunization not carried out because of patient refusal: Secondary | ICD-10-CM | POA: Diagnosis not present

## 2018-09-01 DIAGNOSIS — M25561 Pain in right knee: Secondary | ICD-10-CM | POA: Diagnosis not present

## 2018-09-01 DIAGNOSIS — M25562 Pain in left knee: Secondary | ICD-10-CM | POA: Diagnosis not present

## 2018-10-02 DIAGNOSIS — I10 Essential (primary) hypertension: Secondary | ICD-10-CM | POA: Diagnosis not present

## 2018-10-02 DIAGNOSIS — E7800 Pure hypercholesterolemia, unspecified: Secondary | ICD-10-CM | POA: Diagnosis not present

## 2018-10-02 DIAGNOSIS — Z299 Encounter for prophylactic measures, unspecified: Secondary | ICD-10-CM | POA: Diagnosis not present

## 2018-10-02 DIAGNOSIS — Z6826 Body mass index (BMI) 26.0-26.9, adult: Secondary | ICD-10-CM | POA: Diagnosis not present

## 2018-10-02 DIAGNOSIS — R1032 Left lower quadrant pain: Secondary | ICD-10-CM | POA: Diagnosis not present

## 2018-10-11 DIAGNOSIS — G5731 Lesion of lateral popliteal nerve, right lower limb: Secondary | ICD-10-CM | POA: Diagnosis not present

## 2018-10-11 DIAGNOSIS — M4716 Other spondylosis with myelopathy, lumbar region: Secondary | ICD-10-CM | POA: Diagnosis not present

## 2018-10-12 DIAGNOSIS — C44329 Squamous cell carcinoma of skin of other parts of face: Secondary | ICD-10-CM | POA: Diagnosis not present

## 2018-10-12 DIAGNOSIS — L988 Other specified disorders of the skin and subcutaneous tissue: Secondary | ICD-10-CM | POA: Diagnosis not present

## 2018-10-13 DIAGNOSIS — M8588 Other specified disorders of bone density and structure, other site: Secondary | ICD-10-CM | POA: Diagnosis not present

## 2018-10-13 DIAGNOSIS — Z981 Arthrodesis status: Secondary | ICD-10-CM | POA: Diagnosis not present

## 2018-10-13 DIAGNOSIS — M4716 Other spondylosis with myelopathy, lumbar region: Secondary | ICD-10-CM | POA: Diagnosis not present

## 2018-10-13 DIAGNOSIS — M4326 Fusion of spine, lumbar region: Secondary | ICD-10-CM | POA: Diagnosis not present

## 2018-10-19 DIAGNOSIS — M4716 Other spondylosis with myelopathy, lumbar region: Secondary | ICD-10-CM | POA: Diagnosis not present

## 2018-10-23 DIAGNOSIS — I10 Essential (primary) hypertension: Secondary | ICD-10-CM | POA: Diagnosis not present

## 2018-10-23 DIAGNOSIS — M1991 Primary osteoarthritis, unspecified site: Secondary | ICD-10-CM | POA: Diagnosis not present

## 2018-10-23 DIAGNOSIS — Z299 Encounter for prophylactic measures, unspecified: Secondary | ICD-10-CM | POA: Diagnosis not present

## 2018-10-23 DIAGNOSIS — M549 Dorsalgia, unspecified: Secondary | ICD-10-CM | POA: Diagnosis not present

## 2018-10-23 DIAGNOSIS — Z6826 Body mass index (BMI) 26.0-26.9, adult: Secondary | ICD-10-CM | POA: Diagnosis not present

## 2018-10-23 DIAGNOSIS — R1032 Left lower quadrant pain: Secondary | ICD-10-CM | POA: Diagnosis not present

## 2018-11-15 DIAGNOSIS — M160 Bilateral primary osteoarthritis of hip: Secondary | ICD-10-CM | POA: Diagnosis not present

## 2018-11-15 DIAGNOSIS — M25552 Pain in left hip: Secondary | ICD-10-CM | POA: Diagnosis not present

## 2018-11-15 DIAGNOSIS — M170 Bilateral primary osteoarthritis of knee: Secondary | ICD-10-CM | POA: Diagnosis not present

## 2018-11-29 DIAGNOSIS — M170 Bilateral primary osteoarthritis of knee: Secondary | ICD-10-CM | POA: Diagnosis not present

## 2018-12-20 DIAGNOSIS — M4716 Other spondylosis with myelopathy, lumbar region: Secondary | ICD-10-CM | POA: Diagnosis not present

## 2018-12-20 DIAGNOSIS — G5731 Lesion of lateral popliteal nerve, right lower limb: Secondary | ICD-10-CM | POA: Diagnosis not present

## 2019-03-08 DIAGNOSIS — Z6826 Body mass index (BMI) 26.0-26.9, adult: Secondary | ICD-10-CM | POA: Diagnosis not present

## 2019-03-08 DIAGNOSIS — Z299 Encounter for prophylactic measures, unspecified: Secondary | ICD-10-CM | POA: Diagnosis not present

## 2019-03-08 DIAGNOSIS — M1710 Unilateral primary osteoarthritis, unspecified knee: Secondary | ICD-10-CM | POA: Diagnosis not present

## 2019-03-08 DIAGNOSIS — Z87891 Personal history of nicotine dependence: Secondary | ICD-10-CM | POA: Diagnosis not present

## 2019-04-06 DIAGNOSIS — Z6827 Body mass index (BMI) 27.0-27.9, adult: Secondary | ICD-10-CM | POA: Diagnosis not present

## 2019-04-06 DIAGNOSIS — Z713 Dietary counseling and surveillance: Secondary | ICD-10-CM | POA: Diagnosis not present

## 2019-04-06 DIAGNOSIS — M1710 Unilateral primary osteoarthritis, unspecified knee: Secondary | ICD-10-CM | POA: Diagnosis not present

## 2019-04-06 DIAGNOSIS — I10 Essential (primary) hypertension: Secondary | ICD-10-CM | POA: Diagnosis not present

## 2019-04-06 DIAGNOSIS — Z299 Encounter for prophylactic measures, unspecified: Secondary | ICD-10-CM | POA: Diagnosis not present

## 2019-04-17 ENCOUNTER — Encounter: Payer: Self-pay | Admitting: Cardiology

## 2019-04-17 DIAGNOSIS — Z299 Encounter for prophylactic measures, unspecified: Secondary | ICD-10-CM | POA: Diagnosis not present

## 2019-04-17 DIAGNOSIS — Z0000 Encounter for general adult medical examination without abnormal findings: Secondary | ICD-10-CM | POA: Diagnosis not present

## 2019-04-17 DIAGNOSIS — E7800 Pure hypercholesterolemia, unspecified: Secondary | ICD-10-CM | POA: Diagnosis not present

## 2019-04-17 DIAGNOSIS — Z125 Encounter for screening for malignant neoplasm of prostate: Secondary | ICD-10-CM | POA: Diagnosis not present

## 2019-04-17 DIAGNOSIS — Z79899 Other long term (current) drug therapy: Secondary | ICD-10-CM | POA: Diagnosis not present

## 2019-04-17 DIAGNOSIS — Z7189 Other specified counseling: Secondary | ICD-10-CM | POA: Diagnosis not present

## 2019-04-17 DIAGNOSIS — I10 Essential (primary) hypertension: Secondary | ICD-10-CM | POA: Diagnosis not present

## 2019-04-17 DIAGNOSIS — Z6826 Body mass index (BMI) 26.0-26.9, adult: Secondary | ICD-10-CM | POA: Diagnosis not present

## 2019-04-17 DIAGNOSIS — Z1211 Encounter for screening for malignant neoplasm of colon: Secondary | ICD-10-CM | POA: Diagnosis not present

## 2019-04-17 DIAGNOSIS — Z1331 Encounter for screening for depression: Secondary | ICD-10-CM | POA: Diagnosis not present

## 2019-04-17 DIAGNOSIS — Z1339 Encounter for screening examination for other mental health and behavioral disorders: Secondary | ICD-10-CM | POA: Diagnosis not present

## 2019-04-17 DIAGNOSIS — R5383 Other fatigue: Secondary | ICD-10-CM | POA: Diagnosis not present

## 2019-04-30 DIAGNOSIS — M1612 Unilateral primary osteoarthritis, left hip: Secondary | ICD-10-CM | POA: Diagnosis not present

## 2019-04-30 DIAGNOSIS — M1712 Unilateral primary osteoarthritis, left knee: Secondary | ICD-10-CM | POA: Diagnosis not present

## 2019-04-30 DIAGNOSIS — M25562 Pain in left knee: Secondary | ICD-10-CM | POA: Diagnosis not present

## 2019-05-21 NOTE — H&P (Signed)
TOTAL HIP ADMISSION H&P  Patient is admitted for left total hip arthroplasty, anterior approach.  Subjective:  Chief Complaint: Left hip primary OA / pain  HPI: Corey Aguilar, 85 y.o. male, has a history of pain and functional disability in the left hip due to arthritis and patient has failed non-surgical conservative treatments for greater than 12 weeks to include NSAID's and/or analgesics, corticosteriod injections, use of assistive devices and activity modification.  Onset of symptoms was gradual starting ~2 years ago with gradually worsening course since that time.The patient noted no past surgery on the left hip(s).  Patient currently rates pain in the left hip at 8 out of 10 with activity. Patient has night pain, worsening of pain with activity and weight bearing, trendelenberg gait, pain that interfers with activities of daily living and pain with passive range of motion. Patient has evidence of periarticular osteophytes and joint space narrowing by imaging studies. This condition presents safety issues increasing the risk of falls.  There is no current active infection.  Risks, benefits and expectations were discussed with the patient.  Risks including but not limited to the risk of anesthesia, blood clots, nerve damage, blood vessel damage, failure of the prosthesis, infection and up to and including death.  Patient understand the risks, benefits and expectations and wishes to proceed with surgery.   PCP: Monico Blitz, MD  D/C Plans:       Home   Post-op Meds:       No Rx given   Tranexamic Acid:      To be given - IV   Decadron:      Is to be given  FYI:      ASA  Tramadol & APAP (Constipation with Norco)  DME:   Rx given for - [facility]W   PT:   No PT   Pharmacy: [facility]    Patient Active Problem List   Diagnosis Date Noted  . Rectal bleeding 07/28/2018  . Constipation 07/28/2018   Past Medical History:  Diagnosis Date  . Facial basal cell cancer   . Heart murmur     Per patient, echocardiogram done was told nothing to be concerned about  . Left hip pain   . Lumbago     Past Surgical History:  Procedure Laterality Date  . BACK SURGERY  2015    No current facility-administered medications for this encounter.    Current Outpatient Medications  Medication Sig Dispense Refill Last Dose  . Aspirin-Acetaminophen-Caffeine (GOODYS EXTRA STRENGTH PO) Take by mouth as needed (pain).      . polyethylene glycol powder (MIRALAX) powder Take one capful twice a day until a soft bowel movement. Then continue one capful every morning if no GOOD bowel movement the day prior. (Patient not taking: Reported on 05/18/2019) 255 g 0 Not Taking at Unknown time   Allergies  Allergen Reactions  . Penicillins     Did it involve swelling of the face/tongue/throat, SOB, or low BP? No Did it involve sudden or severe rash/hives, skin peeling, or any reaction on the inside of your mouth or nose? Yes Did you need to seek medical attention at a hospital or doctor's office? No When did it last happen?childhood If all above answers are "NO", may proceed with cephalosporin use.    Social History   Tobacco Use  . Smoking status: Former Smoker    Quit date: 1957    Years since quitting: 63.5  . Smokeless tobacco: Never Used  Substance Use  Topics  . Alcohol use: Yes    Comment: occasional wine with a meal    Family History  Problem Relation Age of Onset  . Colon cancer Neg Hx      Review of Systems  Constitutional: Negative.   HENT: Negative.   Eyes: Negative.   Respiratory: Negative.   Cardiovascular: Negative.   Gastrointestinal: Negative.   Genitourinary: Negative.   Musculoskeletal: Positive for back pain and joint pain.  Skin: Negative.   Neurological: Negative.   Endo/Heme/Allergies: Negative.   Psychiatric/Behavioral: Negative.     Objective:  Physical Exam  Constitutional: He is oriented to person, place, and time. He appears well-developed.   HENT:  Head: Normocephalic.  Eyes: Pupils are equal, round, and reactive to light.  Neck: Neck supple. No JVD present. No tracheal deviation present. No thyromegaly present.  Cardiovascular: Normal rate, regular rhythm and intact distal pulses.  Murmur heard. Respiratory: Effort normal and breath sounds normal. No respiratory distress. He has no wheezes.  GI: Soft. There is no abdominal tenderness. There is no guarding.  Musculoskeletal:     Left hip: He exhibits decreased range of motion, decreased strength, tenderness and bony tenderness. He exhibits no swelling, no deformity and no laceration.     Left knee: He exhibits bony tenderness. Tenderness found.  Lymphadenopathy:    He has no cervical adenopathy.  Neurological: He is alert and oriented to person, place, and time. A sensory deficit (drop foot right leg; occassional tingling bil LEs) is present.  Skin: Skin is warm and dry.  Psychiatric: He has a normal mood and affect.     Labs:  Estimated body mass index is 26.22 kg/m as calculated from the following:   Height as of 07/28/18: 5\' 7"  (1.702 m).   Weight as of 07/28/18: 75.9 kg.   Imaging Review Plain radiographs demonstrate severe degenerative joint disease of the left hip. The bone quality appears to be good for age and reported activity level.    Assessment/Plan:  End stage arthritis, left hip  The patient history, physical examination, clinical judgement of the provider and imaging studies are consistent with end stage degenerative joint disease of the left hip and total hip arthroplasty is deemed medically necessary. The treatment options including medical management, injection therapy, arthroscopy and arthroplasty were discussed at length. The risks and benefits of total hip arthroplasty were presented and reviewed. The risks due to aseptic loosening, infection, stiffness, dislocation/subluxation,  thromboembolic complications and other imponderables were discussed.   The patient acknowledged the explanation, agreed to proceed with the plan and consent was signed. Patient is being admitted for inpatient treatment for surgery, pain control, PT, OT, prophylactic antibiotics, VTE prophylaxis, progressive ambulation and ADL's and discharge planning.The patient is planning to be discharged home.     West Pugh Keeshia Sanderlin   PA-C  05/21/2019, 9:43 AM

## 2019-05-22 ENCOUNTER — Encounter: Payer: Self-pay | Admitting: *Deleted

## 2019-05-24 NOTE — Patient Instructions (Addendum)
YOU NEED TO HAVE A COVID 19 TEST ON 7-17-230 PM .  ONCE YOUR COVID TEST IS COMPLETED, PLEASE BEGIN THE QUARANTINE INSTRUCTIONS AS OUTLINED IN YOUR HANDOUT.                Corey Aguilar    Your procedure is scheduled on: 05-29-2019   Report to [facility] Main  Entrance Report to admitting at 11:50 AM      Call this number if you have problems the morning of surgery [phone]     Remember: [place], [place].   NO SOLID FOOD AFTER MIDNIGHT THE NIGHT PRIOR TO SURGERY.  NOTHING BY MOUTH EXCEPT CLEAR LIQUIDS UNTIL 11:20 AM . PLEASE FINISH ENSURE DRINK PER SURGEON ORDER 3 HOURS PRIOR TO SCHEDULED SURGERY TIME WHICH NEEDS TO BE COMPLETED AT 11:20 AM.   CLEAR LIQUID DIET   Foods Allowed                                                                     Foods Excluded  Coffee and tea, regular and decaf                             liquids that you cannot  Plain Jell-O any favor except red or purple                                           see through such as: Fruit ices (not with fruit pulp)                                     milk, soups, orange juice  Iced Popsicles                                    All solid food Carbonated beverages, regular and diet                                    Cranberry, grape and apple juices Sports drinks like Gatorade Lightly seasoned clear broth or consume(fat free) Sugar, honey syrup  Sample Menu Breakfast                                Lunch                                     Supper Cranberry juice                    Beef broth                            Chicken broth Jell-O  Grape juice                           Apple juice Coffee or tea                        Jell-O                                      Popsicle                                                Coffee or tea                        Coffee or  tea  _____________________________________________________________________     Take these medicines the morning of surgery with A SIP OF WATER:none                                You may not have any metal on your body including piercings            Do not wear jewelry,  lotions, powders or  deodorant                          Men may shave face and neck.   Do not bring valuables to the hospital. [place].  Contacts, dentures or bridgework may not be worn into surgery.      _____________________________________________________________________             [facility] - Preparing for Surgery Before surgery, you can play an important role .  Because skin is not sterile, your skin needs to be as free of germs as possible.   You can reduce the number of germs on your skin by washing with CHG (chlorahexidine gluconate) soap before surgery.   CHG is an antiseptic cleaner which kills germs and bonds with the skin to continue killing germs even after washing. Please DO NOT use if you have an allergy to CHG or antibacterial soaps .  If your skin becomes reddened/irritated stop using the CHG and inform your nurse when you arrive at Short Stay. Do not shave (including legs and underarms) for at least 48 hours prior to the first CHG showerPlease follow these instructions carefully:  1.  Shower with CHG Soap the night before surgery and the  morning of Surgery.  2.  If you choose to wash your hair, wash your hair first as usual with your  normal  shampoo.  3.  After you shampoo, rinse your hair and body thoroughly to remove the  shampoo.                                        4.  Use CHG as you would any other liquid soap.  You can apply chg directly  to the skin and wash  Gently with a scrungie or clean washcloth.  5.  Apply the CHG Soap to your body ONLY FROM THE NECK DOWN.   Do not use on face/ open                            Wound or open sores. Avoid contact with eyes, ears mouth and genitals (private parts).                       Wash face,  Genitals (private parts) with your normal soap.             6.  Wash thoroughly, paying special attention to the area where your surgery  will be performed.  7.  Thoroughly rinse your body with warm water from the neck down.  8.  DO NOT shower/wash with your normal soap after using and rinsing off  the CHG Soap.             9.  Pat yourself dry with a clean towel.            10.  Wear clean pajamas.            11.  Place clean sheets on your bed the night of your first shower and do not  sleep with pets . Day of Surgery : Do not apply any lotions/deodorants the morning of surgery.  Please wear clean clothes to the hospital/surgery center.   FAILURE TO FOLLOW THESE INSTRUCTIONS MAY RESULT IN THE CANCELLATION OF YOUR SURGERY PATIENT SIGNATURE_________________________________  NURSE SIGNATURE__________________________________  ________________________________________________________________________   Corey Aguilar  An incentive spirometer is a tool that can help keep your lungs clear and active. This tool measures how well you are filling your lungs with each breath. Taking long deep breaths may help reverse or decrease the chance of developing breathing (pulmonary) problems (especially infection) following:  A long period of time when you are unable to move or be active. BEFORE THE PROCEDURE   If the spirometer includes an indicator to show your best effort, your nurse or respiratory therapist will set it to a desired goal.  If possible, sit up straight or lean slightly forward. Try not to slouch.  Hold the incentive spirometer in an upright position. INSTRUCTIONS FOR USE  1. Sit on the edge of your bed if possible, or sit up as far as you can in bed or on a chair. 2. Hold the incentive spirometer in an upright position. 3. Breathe out  normally. 4. Place the mouthpiece in your mouth and seal your lips tightly around it. 5. Breathe in slowly and as deeply as possible, raising the piston or the ball toward the top of the column. 6. Hold your breath for 3-5 seconds or for as long as possible. Allow the piston or ball to fall to the bottom of the column. 7. Remove the mouthpiece from your mouth and breathe out normally. 8. Rest for a few seconds and repeat Steps 1 through 7 at least 10 times every 1-2 hours when you are awake. Take your time and take a few normal breaths between deep breaths. 9. The spirometer may include an indicator to show your best effort. Use the indicator as a goal to work toward during each repetition. 10. After each set of 10 deep breaths, practice coughing to be sure your lungs are clear. If you have an incision (the cut made at the time of surgery),  support your incision when coughing by placing a pillow or rolled up towels firmly against it. Once you are able to get out of bed, walk around indoors and cough well. You may stop using the incentive spirometer when instructed by your caregiver.  RISKS AND COMPLICATIONS  Take your time so you do not get dizzy or light-headed.  If you are in pain, you may need to take or ask for pain medication before doing incentive spirometry. It is harder to take a deep breath if you are having pain. AFTER USE  Rest and breathe slowly and easily.  It can be helpful to keep track of a log of your progress. Your caregiver can provide you with a simple table to help with this. If you are using the spirometer at home, follow these instructions: [employer] IF:   You are having difficultly using the spirometer.  You have trouble using the spirometer as often as instructed.  Your pain medication is not giving enough relief while using the spirometer.  You develop fever of 100.5 F (38.1 C) or higher. SEEK IMMEDIATE MEDICAL CARE IF:   You cough up bloody sputum  that had not been present before.  You develop fever of 102 F (38.9 C) or greater.  You develop worsening pain at or near the incision site. MAKE SURE YOU:   Understand these instructions.  Will watch your condition.  Will get help right away if you are not doing well or get worse. Document Released: 03/07/2007 Document Revised: 01/17/2012 Document Reviewed: 05/08/2007 Exit[employer] Patient Information 2014 Exit[employer], [place].   ________________________________________________________________________  WHAT IS A BLOOD TRANSFUSION? Blood Transfusion Information  A transfusion is the replacement of blood or some of its parts. Blood is made up of multiple cells which provide different functions.  Red blood cells carry oxygen and are used for blood loss replacement.  White blood cells fight against infection.  Platelets control bleeding.  Plasma helps clot blood.  Other blood products are available for specialized needs, such as hemophilia or other clotting disorders. BEFORE THE TRANSFUSION  Who gives blood for transfusions?   Healthy volunteers who are fully evaluated to make sure their blood is safe. This is blood bank blood. Transfusion therapy is the safest it has ever been in the practice of medicine. Before blood is taken from a donor, a complete history is taken to make sure that person has no history of diseases nor engages in risky social behavior (examples are intravenous drug use or sexual activity with multiple partners). The donor's travel history is screened to minimize risk of transmitting infections, such as malaria. The donated blood is tested for signs of infectious diseases, such as HIV and hepatitis. The blood is then tested to be sure it is compatible with you in order to minimize the chance of a transfusion reaction. If you or a relative donates blood, this is often done in anticipation of surgery and is not appropriate for emergency situations. It takes many days to  process the donated blood. RISKS AND COMPLICATIONS Although transfusion therapy is very safe and saves many lives, the main dangers of transfusion include:   Getting an infectious disease.  Developing a transfusion reaction. This is an allergic reaction to something in the blood you were given. Every precaution is taken to prevent this. The decision to have a blood transfusion has been considered carefully by your caregiver before blood is given. Blood is not given unless the benefits outweigh the risks. AFTER THE TRANSFUSION  Right after receiving a blood transfusion, you will usually feel much better and more energetic. This is especially true if your red blood cells have gotten low (anemic). The transfusion raises the level of the red blood cells which carry oxygen, and this usually causes an energy increase.  The nurse administering the transfusion will monitor you carefully for complications. HOME CARE INSTRUCTIONS  No special instructions are needed after a transfusion. You may find your energy is better. Speak with your caregiver about any limitations on activity for underlying diseases you may have. SEEK MEDICAL CARE IF:   Your condition is not improving after your transfusion.  You develop redness or irritation at the intravenous (IV) site. SEEK IMMEDIATE MEDICAL CARE IF:  Any of the following symptoms occur over the next 12 hours:  Shaking chills.  You have a temperature by mouth above 102 F (38.9 C), not controlled by medicine.  Chest, back, or muscle pain.  People around you feel you are not acting correctly or are confused.  Shortness of breath or difficulty breathing.  Dizziness and fainting.  You get a rash or develop hives.  You have a decrease in urine output.  Your urine turns a dark color or changes to pink, red, or brown. Any of the following symptoms occur over the next 10 days:  You have a temperature by mouth above 102 F (38.9 C), not controlled by  medicine.  Shortness of breath.  Weakness after normal activity.  The white part of the eye turns yellow (jaundice).  You have a decrease in the amount of urine or are urinating less often.  Your urine turns a dark color or changes to pink, red, or brown. Document Released: 10/22/2000 Document Revised: 01/17/2012 Document Reviewed: 06/10/2008 [facility] Patient Information 2014 [place], [place].  _______________________________________________________________________

## 2019-05-25 ENCOUNTER — Other Ambulatory Visit: Payer: Self-pay

## 2019-05-25 ENCOUNTER — Encounter (HOSPITAL_COMMUNITY)
Admission: RE | Admit: 2019-05-25 | Discharge: 2019-05-25 | Disposition: A | Discharge status: Home / Self Care | Payer: PPO | Source: Ambulatory Visit | Attending: Orthopedic Surgery | Admitting: Orthopedic Surgery

## 2019-05-25 ENCOUNTER — Encounter (HOSPITAL_COMMUNITY): Payer: Self-pay

## 2019-05-25 ENCOUNTER — Other Ambulatory Visit (HOSPITAL_COMMUNITY)
Admission: RE | Admit: 2019-05-25 | Discharge: 2019-05-25 | Disposition: A | Discharge status: Home / Self Care | Payer: PPO | Source: Ambulatory Visit | Attending: Orthopedic Surgery | Admitting: Orthopedic Surgery

## 2019-05-25 DIAGNOSIS — Z1159 Encounter for screening for other viral diseases: Secondary | ICD-10-CM | POA: Diagnosis not present

## 2019-05-25 DIAGNOSIS — M1612 Unilateral primary osteoarthritis, left hip: Secondary | ICD-10-CM | POA: Diagnosis not present

## 2019-05-25 DIAGNOSIS — Z7982 Long term (current) use of aspirin: Secondary | ICD-10-CM | POA: Insufficient documentation

## 2019-05-25 DIAGNOSIS — Z87891 Personal history of nicotine dependence: Secondary | ICD-10-CM | POA: Insufficient documentation

## 2019-05-25 DIAGNOSIS — Z01812 Encounter for preprocedural laboratory examination: Secondary | ICD-10-CM | POA: Insufficient documentation

## 2019-05-25 HISTORY — DX: Personal history of urinary calculi: Z87.442

## 2019-05-25 LAB — CBC
HCT: 41.6 % (ref 39.0–52.0)
Hemoglobin: 13.4 g/dL (ref 13.0–17.0)
MCH: 30.4 pg (ref 26.0–34.0)
MCHC: 32.2 g/dL (ref 30.0–36.0)
MCV: 94.3 fL (ref 80.0–100.0)
Platelets: 177 10*3/uL (ref 150–400)
RBC: 4.41 MIL/uL (ref 4.22–5.81)
RDW: 13.4 % (ref 11.5–15.5)
WBC: 6.6 10*3/uL (ref 4.0–10.5)
nRBC: 0 % (ref 0.0–0.2)

## 2019-05-25 LAB — SURGICAL PCR SCREEN
MRSA, PCR: NEGATIVE
Staphylococcus aureus: NEGATIVE

## 2019-05-25 LAB — SARS CORONAVIRUS 2 (TAT 6-24 HRS): SARS Coronavirus 2: NEGATIVE

## 2019-05-26 LAB — ABO/RH: ABO/RH(D): O POS

## 2019-05-29 ENCOUNTER — Inpatient Hospital Stay (HOSPITAL_COMMUNITY): Payer: PPO | Admitting: Physician Assistant

## 2019-05-29 ENCOUNTER — Inpatient Hospital Stay (HOSPITAL_COMMUNITY)
Admission: RE | Admit: 2019-05-29 | Discharge: 2019-05-30 | DRG: 470 | Disposition: A | Discharge status: Home / Self Care | Payer: PPO | Attending: Orthopedic Surgery | Admitting: Orthopedic Surgery

## 2019-05-29 ENCOUNTER — Inpatient Hospital Stay (HOSPITAL_COMMUNITY): Payer: PPO

## 2019-05-29 ENCOUNTER — Encounter (HOSPITAL_COMMUNITY): Payer: Self-pay | Admitting: Emergency Medicine

## 2019-05-29 ENCOUNTER — Encounter (HOSPITAL_COMMUNITY)
Admission: RE | Disposition: A | Discharge status: Home / Self Care | Payer: Self-pay | Source: Home / Self Care | Attending: Orthopedic Surgery

## 2019-05-29 ENCOUNTER — Inpatient Hospital Stay (HOSPITAL_COMMUNITY): Payer: PPO | Admitting: Certified Registered"

## 2019-05-29 ENCOUNTER — Other Ambulatory Visit: Payer: Self-pay

## 2019-05-29 DIAGNOSIS — Z7982 Long term (current) use of aspirin: Secondary | ICD-10-CM | POA: Diagnosis not present

## 2019-05-29 DIAGNOSIS — Z96649 Presence of unspecified artificial hip joint: Secondary | ICD-10-CM

## 2019-05-29 DIAGNOSIS — Z85828 Personal history of other malignant neoplasm of skin: Secondary | ICD-10-CM | POA: Diagnosis not present

## 2019-05-29 DIAGNOSIS — M21371 Foot drop, right foot: Secondary | ICD-10-CM | POA: Diagnosis present

## 2019-05-29 DIAGNOSIS — Z96642 Presence of left artificial hip joint: Secondary | ICD-10-CM | POA: Diagnosis not present

## 2019-05-29 DIAGNOSIS — Z886 Allergy status to analgesic agent status: Secondary | ICD-10-CM | POA: Diagnosis not present

## 2019-05-29 DIAGNOSIS — Z471 Aftercare following joint replacement surgery: Secondary | ICD-10-CM | POA: Diagnosis not present

## 2019-05-29 DIAGNOSIS — E663 Overweight: Secondary | ICD-10-CM | POA: Diagnosis present

## 2019-05-29 DIAGNOSIS — Z6828 Body mass index (BMI) 28.0-28.9, adult: Secondary | ICD-10-CM | POA: Diagnosis not present

## 2019-05-29 DIAGNOSIS — Z419 Encounter for procedure for purposes other than remedying health state, unspecified: Secondary | ICD-10-CM

## 2019-05-29 DIAGNOSIS — Z87891 Personal history of nicotine dependence: Secondary | ICD-10-CM

## 2019-05-29 DIAGNOSIS — Z880 Allergy status to penicillin: Secondary | ICD-10-CM | POA: Diagnosis not present

## 2019-05-29 DIAGNOSIS — Z79899 Other long term (current) drug therapy: Secondary | ICD-10-CM

## 2019-05-29 DIAGNOSIS — Z87442 Personal history of urinary calculi: Secondary | ICD-10-CM | POA: Diagnosis not present

## 2019-05-29 DIAGNOSIS — M1612 Unilateral primary osteoarthritis, left hip: Principal | ICD-10-CM | POA: Diagnosis present

## 2019-05-29 DIAGNOSIS — C44229 Squamous cell carcinoma of skin of left ear and external auricular canal: Secondary | ICD-10-CM | POA: Diagnosis not present

## 2019-05-29 DIAGNOSIS — C44319 Basal cell carcinoma of skin of other parts of face: Secondary | ICD-10-CM | POA: Diagnosis not present

## 2019-05-29 HISTORY — PX: TOTAL HIP ARTHROPLASTY: SHX124

## 2019-05-29 LAB — TYPE AND SCREEN
ABO/RH(D): O POS
Antibody Screen: NEGATIVE

## 2019-05-29 SURGERY — ARTHROPLASTY, HIP, TOTAL, ANTERIOR APPROACH
Anesthesia: Spinal | Laterality: Left

## 2019-05-29 MED ORDER — BISACODYL 10 MG RE SUPP: 10.0000 mg | Freq: Every day | RECTAL | Status: DC | PRN

## 2019-05-29 MED ORDER — LACTATED RINGERS IV SOLN
INTRAVENOUS | Status: DC
  Administered 2019-05-29: 12:00:00 via INTRAVENOUS

## 2019-05-29 MED ORDER — FENTANYL CITRATE (PF) 100 MCG/2ML IJ SOLN
INTRAMUSCULAR | Status: AC
  Administered 2019-05-29: 17:00:00 50 ug via INTRAVENOUS
  Filled 2019-05-29: qty 2

## 2019-05-29 MED ORDER — STERILE WATER FOR IRRIGATION IR SOLN
Status: DC | PRN
  Administered 2019-05-29: 2000 mL

## 2019-05-29 MED ORDER — METHOCARBAMOL 500 MG PO TABS
500.0000 mg | ORAL_TABLET | Freq: Four times a day (QID) | ORAL | Status: DC | PRN
  Administered 2019-05-30: 500 mg via ORAL
  Filled 2019-05-29: qty 1

## 2019-05-29 MED ORDER — POLYETHYLENE GLYCOL 3350 17 G PO PACK
17.0000 g | PACK | Freq: Two times a day (BID) | ORAL | Status: DC
  Administered 2019-05-30: 17 g via ORAL
  Filled 2019-05-29: qty 1

## 2019-05-29 MED ORDER — SODIUM CHLORIDE 0.9 % IV SOLN
INTRAVENOUS | Status: DC | PRN
  Administered 2019-05-29: 20 ug/min via INTRAVENOUS

## 2019-05-29 MED ORDER — DOCUSATE SODIUM 100 MG PO CAPS
100.0000 mg | ORAL_CAPSULE | Freq: Two times a day (BID) | ORAL | Status: DC
  Administered 2019-05-29 – 2019-05-30 (×2): 100 mg via ORAL
  Filled 2019-05-29 (×2): qty 1

## 2019-05-29 MED ORDER — SODIUM CHLORIDE 0.9 % IR SOLN
Status: DC | PRN
  Administered 2019-05-29: 1000 mL

## 2019-05-29 MED ORDER — ONDANSETRON HCL 4 MG/2ML IJ SOLN: 4.0000 mg | Freq: Four times a day (QID) | INTRAMUSCULAR | Status: DC | PRN

## 2019-05-29 MED ORDER — TRANEXAMIC ACID-NACL 1000-0.7 MG/100ML-% IV SOLN
1000.0000 mg | Freq: Once | INTRAVENOUS | Status: AC
  Administered 2019-05-29: 20:00:00 1000 mg via INTRAVENOUS
  Filled 2019-05-29: qty 100

## 2019-05-29 MED ORDER — ONDANSETRON HCL 4 MG PO TABS: 4.0000 mg | ORAL_TABLET | Freq: Four times a day (QID) | ORAL | Status: DC | PRN

## 2019-05-29 MED ORDER — CEFAZOLIN SODIUM-DEXTROSE 2-4 GM/100ML-% IV SOLN
2.0000 g | INTRAVENOUS | Status: AC
  Administered 2019-05-29: 2 g via INTRAVENOUS
  Filled 2019-05-29: qty 100

## 2019-05-29 MED ORDER — PROPOFOL 10 MG/ML IV BOLUS
INTRAVENOUS | Status: DC | PRN
  Administered 2019-05-29: 20 mg via INTRAVENOUS
  Administered 2019-05-29: 10 mg via INTRAVENOUS

## 2019-05-29 MED ORDER — METHOCARBAMOL 500 MG IVPB - SIMPLE MED
INTRAVENOUS | Status: AC
  Administered 2019-05-29: 17:00:00 500 mg via INTRAVENOUS
  Filled 2019-05-29: qty 50

## 2019-05-29 MED ORDER — PHENOL 1.4 % MT LIQD: 1.0000 | OROMUCOSAL | Status: DC | PRN

## 2019-05-29 MED ORDER — DEXAMETHASONE SODIUM PHOSPHATE 10 MG/ML IJ SOLN
10.0000 mg | Freq: Once | INTRAMUSCULAR | Status: AC
  Administered 2019-05-29: 8 mg via INTRAVENOUS

## 2019-05-29 MED ORDER — MAGNESIUM CITRATE PO SOLN: 1.0000 | Freq: Once | ORAL | Status: DC | PRN

## 2019-05-29 MED ORDER — PROPOFOL 10 MG/ML IV BOLUS
INTRAVENOUS | Status: AC
  Filled 2019-05-29: qty 80

## 2019-05-29 MED ORDER — TRAMADOL HCL 50 MG PO TABS
50.0000 mg | ORAL_TABLET | Freq: Four times a day (QID) | ORAL | Status: DC | PRN
  Administered 2019-05-29 – 2019-05-30 (×2): 50 mg via ORAL
  Filled 2019-05-29 (×2): qty 1

## 2019-05-29 MED ORDER — FENTANYL CITRATE (PF) 100 MCG/2ML IJ SOLN
25.0000 ug | INTRAMUSCULAR | Status: DC | PRN
  Administered 2019-05-29 (×2): 50 ug via INTRAVENOUS

## 2019-05-29 MED ORDER — DEXAMETHASONE SODIUM PHOSPHATE 10 MG/ML IJ SOLN
INTRAMUSCULAR | Status: AC
  Filled 2019-05-29: qty 1

## 2019-05-29 MED ORDER — PHENYLEPHRINE 40 MCG/ML (10ML) SYRINGE FOR IV PUSH (FOR BLOOD PRESSURE SUPPORT)
PREFILLED_SYRINGE | INTRAVENOUS | Status: AC
  Filled 2019-05-29: qty 10

## 2019-05-29 MED ORDER — DEXAMETHASONE SODIUM PHOSPHATE 10 MG/ML IJ SOLN
10.0000 mg | Freq: Once | INTRAMUSCULAR | Status: AC
  Administered 2019-05-30: 10 mg via INTRAVENOUS
  Filled 2019-05-29: qty 1

## 2019-05-29 MED ORDER — BUPIVACAINE IN DEXTROSE 0.75-8.25 % IT SOLN
INTRATHECAL | Status: DC | PRN
  Administered 2019-05-29: 1.8 mL via INTRATHECAL

## 2019-05-29 MED ORDER — METOCLOPRAMIDE HCL 5 MG/ML IJ SOLN: 5.0000 mg | Freq: Three times a day (TID) | INTRAMUSCULAR | Status: DC | PRN

## 2019-05-29 MED ORDER — PROMETHAZINE HCL 25 MG/ML IJ SOLN: 6.2500 mg | INTRAMUSCULAR | Status: DC | PRN

## 2019-05-29 MED ORDER — CHLORHEXIDINE GLUCONATE 4 % EX LIQD: 60.0000 mL | Freq: Once | CUTANEOUS | Status: DC

## 2019-05-29 MED ORDER — PHENYLEPHRINE HCL (PRESSORS) 10 MG/ML IV SOLN
INTRAVENOUS | Status: AC
  Filled 2019-05-29: qty 1

## 2019-05-29 MED ORDER — ALUM & MAG HYDROXIDE-SIMETH 200-200-20 MG/5ML PO SUSP: 15.0000 mL | ORAL | Status: DC | PRN

## 2019-05-29 MED ORDER — METHOCARBAMOL 500 MG IVPB - SIMPLE MED
500.0000 mg | Freq: Four times a day (QID) | INTRAVENOUS | Status: DC | PRN
  Administered 2019-05-29: 500 mg via INTRAVENOUS
  Filled 2019-05-29: qty 50

## 2019-05-29 MED ORDER — SODIUM CHLORIDE 0.9 % IV SOLN
INTRAVENOUS | Status: DC
  Administered 2019-05-29 – 2019-05-30 (×2): via INTRAVENOUS

## 2019-05-29 MED ORDER — PHENYLEPHRINE 40 MCG/ML (10ML) SYRINGE FOR IV PUSH (FOR BLOOD PRESSURE SUPPORT)
PREFILLED_SYRINGE | INTRAVENOUS | Status: DC | PRN
  Administered 2019-05-29: 120 ug via INTRAVENOUS
  Administered 2019-05-29: 80 ug via INTRAVENOUS

## 2019-05-29 MED ORDER — FERROUS SULFATE 325 (65 FE) MG PO TABS
325.0000 mg | ORAL_TABLET | Freq: Three times a day (TID) | ORAL | Status: DC
  Administered 2019-05-30: 325 mg via ORAL
  Filled 2019-05-29: qty 1

## 2019-05-29 MED ORDER — CEFAZOLIN SODIUM-DEXTROSE 2-4 GM/100ML-% IV SOLN
2.0000 g | Freq: Four times a day (QID) | INTRAVENOUS | Status: AC
  Administered 2019-05-29 – 2019-05-30 (×2): 2 g via INTRAVENOUS
  Filled 2019-05-29 (×2): qty 100

## 2019-05-29 MED ORDER — ONDANSETRON HCL 4 MG/2ML IJ SOLN
INTRAMUSCULAR | Status: AC
  Filled 2019-05-29: qty 2

## 2019-05-29 MED ORDER — TRANEXAMIC ACID-NACL 1000-0.7 MG/100ML-% IV SOLN
1000.0000 mg | INTRAVENOUS | Status: AC
  Administered 2019-05-29: 1000 mg via INTRAVENOUS
  Filled 2019-05-29: qty 100

## 2019-05-29 MED ORDER — PROPOFOL 10 MG/ML IV BOLUS
INTRAVENOUS | Status: AC
  Filled 2019-05-29: qty 20

## 2019-05-29 MED ORDER — MENTHOL 3 MG MT LOZG: 1.0000 | LOZENGE | OROMUCOSAL | Status: DC | PRN

## 2019-05-29 MED ORDER — DIPHENHYDRAMINE HCL 12.5 MG/5ML PO ELIX: 12.5000 mg | ORAL_SOLUTION | ORAL | Status: DC | PRN

## 2019-05-29 MED ORDER — ASPIRIN 81 MG PO CHEW
81.0000 mg | CHEWABLE_TABLET | Freq: Two times a day (BID) | ORAL | Status: DC
  Administered 2019-05-30: 81 mg via ORAL
  Filled 2019-05-29: qty 1

## 2019-05-29 MED ORDER — ACETAMINOPHEN 500 MG PO TABS
1000.0000 mg | ORAL_TABLET | Freq: Three times a day (TID) | ORAL | Status: DC
  Administered 2019-05-29 – 2019-05-30 (×2): 1000 mg via ORAL
  Filled 2019-05-29 (×2): qty 2

## 2019-05-29 MED ORDER — PROPOFOL 500 MG/50ML IV EMUL
INTRAVENOUS | Status: DC | PRN
  Administered 2019-05-29: 135 ug/kg/min via INTRAVENOUS

## 2019-05-29 MED ORDER — METOCLOPRAMIDE HCL 5 MG PO TABS: 5.0000 mg | ORAL_TABLET | Freq: Three times a day (TID) | ORAL | Status: DC | PRN

## 2019-05-29 MED ORDER — ONDANSETRON HCL 4 MG/2ML IJ SOLN
INTRAMUSCULAR | Status: DC | PRN
  Administered 2019-05-29: 4 mg via INTRAVENOUS

## 2019-05-29 MED ORDER — ACETAMINOPHEN 10 MG/ML IV SOLN
1000.0000 mg | Freq: Once | INTRAVENOUS | Status: DC | PRN
Start: 2019-05-29 — End: 2019-05-29

## 2019-05-29 SURGICAL SUPPLY — 49 items
ADH SKN CLS APL DERMABOND .7 (GAUZE/BANDAGES/DRESSINGS) ×1
BAG DECANTER FOR FLEXI CONT (MISCELLANEOUS) IMPLANT
BAG SPEC THK2 15X12 ZIP CLS (MISCELLANEOUS)
BAG ZIPLOCK 12X15 (MISCELLANEOUS) IMPLANT
BALL HIP ARTICU EZE 36 8.5 (Hips) IMPLANT
BLADE SAG 18X100X1.27 (BLADE) ×3 IMPLANT
BLADE SURG SZ10 CARB STEEL (BLADE) ×6 IMPLANT
COVER PERINEAL POST (MISCELLANEOUS) ×3 IMPLANT
COVER SURGICAL LIGHT HANDLE (MISCELLANEOUS) ×3 IMPLANT
COVER WAND RF STERILE (DRAPES) IMPLANT
CUP ACET PINNACLE SECTR 58MM (Hips) IMPLANT
DERMABOND ADVANCED (GAUZE/BANDAGES/DRESSINGS) ×2
DERMABOND ADVANCED .7 DNX12 (GAUZE/BANDAGES/DRESSINGS) ×1 IMPLANT
DRAPE STERI IOBAN 125X83 (DRAPES) ×3 IMPLANT
DRAPE U-SHAPE 47X51 STRL (DRAPES) ×6 IMPLANT
DRESSING AQUACEL AG SP 3.5X10 (GAUZE/BANDAGES/DRESSINGS) ×1 IMPLANT
DRSG AQUACEL AG SP 3.5X10 (GAUZE/BANDAGES/DRESSINGS) ×3
DURAPREP 26ML APPLICATOR (WOUND CARE) ×3 IMPLANT
ELECT BLADE TIP CTD 4 INCH (ELECTRODE) ×3 IMPLANT
ELECT REM PT RETURN 15FT ADLT (MISCELLANEOUS) ×3 IMPLANT
ELIMINATOR HOLE APEX DEPUY (Hips) ×2 IMPLANT
GLOVE BIO SURGEON STRL SZ 6 (GLOVE) ×6 IMPLANT
GLOVE BIOGEL PI IND STRL 6.5 (GLOVE) ×1 IMPLANT
GLOVE BIOGEL PI IND STRL 7.5 (GLOVE) ×1 IMPLANT
GLOVE BIOGEL PI IND STRL 8.5 (GLOVE) ×1 IMPLANT
GLOVE BIOGEL PI INDICATOR 6.5 (GLOVE) ×2
GLOVE BIOGEL PI INDICATOR 7.5 (GLOVE) ×2
GLOVE BIOGEL PI INDICATOR 8.5 (GLOVE) ×2
GLOVE ECLIPSE 8.0 STRL XLNG CF (GLOVE) ×6 IMPLANT
GLOVE ORTHO TXT STRL SZ7.5 (GLOVE) ×6 IMPLANT
GOWN STRL REUS W/TWL LRG LVL3 (GOWN DISPOSABLE) ×6 IMPLANT
GOWN STRL REUS W/TWL XL LVL3 (GOWN DISPOSABLE) ×3 IMPLANT
HIP BALL ARTICU EZE 36 8.5 (Hips) ×3 IMPLANT
HOLDER FOLEY CATH W/STRAP (MISCELLANEOUS) ×3 IMPLANT
KIT TURNOVER KIT A (KITS) IMPLANT
LINER NEUTRAL 36X58 PLUS4 ×2 IMPLANT
PACK ANTERIOR HIP CUSTOM (KITS) ×3 IMPLANT
PINNACLE SECTOR CUP 58MM (Hips) ×3 IMPLANT
SCREW 6.5MMX30MM (Screw) ×2 IMPLANT
STEM FEMORAL SZ11 HIGH ACTIS (Stem) ×2 IMPLANT
SUT MNCRL AB 4-0 PS2 18 (SUTURE) ×3 IMPLANT
SUT STRATAFIX 0 PDS 27 VIOLET (SUTURE) ×3
SUT VIC AB 1 CT1 36 (SUTURE) ×9 IMPLANT
SUT VIC AB 2-0 CT1 27 (SUTURE) ×6
SUT VIC AB 2-0 CT1 TAPERPNT 27 (SUTURE) ×2 IMPLANT
SUTURE STRATFX 0 PDS 27 VIOLET (SUTURE) ×1 IMPLANT
TRAY FOLEY MTR SLVR 16FR STAT (SET/KITS/TRAYS/PACK) IMPLANT
WATER STERILE IRR 1000ML POUR (IV SOLUTION) ×3 IMPLANT
YANKAUER SUCT BULB TIP 10FT TU (MISCELLANEOUS) IMPLANT

## 2019-05-29 NOTE — Op Note (Signed)
NAME:  Corey Aguilar                ACCOUNT NO.: [id number]      MEDICAL RECORD NO.: [id number]      FACILITY:  [facility]      PHYSICIAN:  Corey Aguilar  DATE OF BIRTH:  [birth year]     DATE OF PROCEDURE:  05/29/2019                                 OPERATIVE REPORT         PREOPERATIVE DIAGNOSIS: Left  hip osteoarthritis.      POSTOPERATIVE DIAGNOSIS:  Left hip osteoarthritis.      PROCEDURE:  Left total hip replacement through an anterior approach   utilizing DePuy THR system, component size 70mm pinnacle cup, a size 36+4 neutral   Altrex liner, a size 11 Hi Actis stem with a 36+8.5 Articuleze metal head ball   ball.      SURGEON:  Corey Aguilar. Alvan Dame, M.D.      ASSISTANT:  Corey Orleans, PA-C     ANESTHESIA:  Spinal.      SPECIMENS:  None.      COMPLICATIONS:  None.      BLOOD LOSS:  350 cc     DRAINS:  None.      INDICATION OF THE PROCEDURE:  Corey Aguilar is a 85 y.o. male who had   presented to office for evaluation of left hip pain.  Radiographs revealed   progressive degenerative changes with bone-on-bone   articulation of the  hip joint, including subchondral cystic changes and osteophytes.  The patient had painful limited range of   motion significantly affecting their overall quality of life and function.  The patient was failing to    respond to conservative measures including medications and/or injections and activity modification and at this point was ready   to proceed with more definitive measures.  Consent was obtained for   benefit of pain relief.  Specific risks of infection, DVT, component   failure, dislocation, neurovascular injury, and need for revision surgery were reviewed in the office as well discussion of   the anterior versus posterior approach were reviewed.     PROCEDURE IN DETAIL:  The patient was brought to operative theater.   Once adequate anesthesia, preoperative antibiotics, 2 gm of Ancef, 1 gm of Tranexamic Acid,  and 10 mg of Decadron were administered, the patient was positioned supine on the [employer] table.  Once the patient was safely positioned with adequate padding of boney prominences we predraped out the hip, and used fluoroscopy to confirm orientation of the pelvis.      The left hip was then prepped and draped from proximal iliac crest to   mid thigh with a shower curtain technique.      Time-out was performed identifying the patient, planned procedure, and the appropriate extremity.     An incision was then made 2 cm lateral to the   anterior superior iliac spine extending over the orientation of the   tensor fascia lata muscle and sharp dissection was carried down to the   fascia of the muscle.      The fascia was then incised.  The muscle belly was identified and swept   laterally and retractor placed along the superior neck.  Following   cauterization of the circumflex vessels and removing some pericapsular  fat, a second cobra retractor was placed on the inferior neck.  A T-capsulotomy was made along the line of the   superior neck to the trochanteric fossa, then extended proximally and   distally.  Tag sutures were placed and the retractors were then placed   intracapsular.  We then identified the trochanteric fossa and   orientation of my neck cut and then made a neck osteotomy with the femur on traction.  The femoral   head was removed without difficulty or complication.  Traction was let   off and retractors were placed posterior and anterior around the   acetabulum.      The labrum and foveal tissue were debrided.  I began reaming with a 45 mm   reamer and reamed up to 57 mm reamer with good bony bed preparation and a 58 mm  cup was chosen.  The final 58 mm Pinnacle cup was then impacted under fluoroscopy to confirm the depth of penetration and orientation with respect to   Abduction and forward flexion.  A screw was placed into the ilium followed by the hole eliminator.  The  final   36+4 neutral Altrex liner was impacted with good visualized rim fit.  The cup was positioned anatomically within the acetabular portion of the pelvis.      At this point, the femur was rolled to 100 degrees.  Further capsule was   released off the inferior aspect of the femoral neck.  I then   released the superior capsule proximally.  With the leg in a neutral position the hook was placed laterally   along the femur under the vastus lateralis origin and elevated manually and then held in position using the hook attachment on the bed.  The leg was then extended and adducted with the leg rolled to 100   degrees of external rotation.  Retractors were placed along the medial calcar and posteriorly over the greater trochanter.  Once the proximal femur was fully   exposed, I used a box osteotome to set orientation.  I then began   broaching with the starting chili pepper broach and passed this by hand and then broached up to 11.  With the 11 broach in place I chose a high offset neck and did several trial reductions.  The offset was appropriate, leg lengths   appeared to be equal best matched with the +8.5 head ball trial confirmed radiographically.   Given these findings, I went ahead and dislocated the hip, repositioned all   retractors and positioned the right hip in the extended and abducted position.  The final 11 Hi Actis stem was   chosen and it was impacted down to the level of neck cut.  Based on this   and the trial reductions, a final 36+8.5 Articuleze metal head ball was chosen and   impacted onto a clean and dry trunnion, and the hip was reduced.  The   hip had been irrigated throughout the case again at this point.  I did   reapproximate the superior capsular leaflet to the anterior leaflet   using #1 Vicryl.  The fascia of the   tensor fascia lata muscle was then reapproximated using #1 Vicryl and #0 Stratafix sutures.  The   remaining wound was closed with 2-0 Vicryl and  running 4-0 Monocryl.   The hip was cleaned, dried, and dressed sterilely using Dermabond and   Aquacel dressing.  The patient was then brought   to recovery room  in stable condition tolerating the procedure well.    Corey Orleans, PA-C was present for the entirety of the case involved from   preoperative positioning, perioperative retractor management, general   facilitation of the case, as well as primary wound closure as assistant.            Corey Aguilar Alvan Dame, M.D.        05/29/2019 2:55 PM

## 2019-05-29 NOTE — Anesthesia Preprocedure Evaluation (Addendum)
Anesthesia Evaluation  Patient identified by MRN, date of birth, ID band Patient awake    Reviewed: Allergy & Precautions, NPO status , Patient's Chart, lab work & pertinent test results  Airway Mallampati: II  TM Distance: >3 FB Neck ROM: Full    Dental no notable dental hx.    Pulmonary neg pulmonary ROS, former smoker,    Pulmonary exam normal breath sounds clear to auscultation       Cardiovascular + Valvular Problems/Murmurs AI  Rhythm:Regular Rate:Normal + Systolic murmurs    Neuro/Psych negative neurological ROS  negative psych ROS   GI/Hepatic negative GI ROS, Neg liver ROS,   Endo/Other  negative endocrine ROS  Renal/GU negative Renal ROS  negative genitourinary   Musculoskeletal  (+) Arthritis , Osteoarthritis,    Abdominal   Peds negative pediatric ROS (+)  Hematology negative hematology ROS (+)   Anesthesia Other Findings   Reproductive/Obstetrics negative OB ROS                            Anesthesia Physical Anesthesia Plan  ASA: III  Anesthesia Plan: Spinal   Post-op Pain Management:    Induction: Intravenous  PONV Risk Score and Plan: 1 and Ondansetron and Treatment may vary due to age or medical condition  Airway Management Planned: Simple Face Mask  Additional Equipment:   Intra-op Plan:   Post-operative Plan:   Informed Consent: I have reviewed the patients History and Physical, chart, labs and discussed the procedure including the risks, benefits and alternatives for the proposed anesthesia with the patient or authorized representative who has indicated his/her understanding and acceptance.     Dental advisory given  Plan Discussed with: CRNA and Surgeon  Anesthesia Plan Comments:        Anesthesia Quick Evaluation

## 2019-05-29 NOTE — Anesthesia Procedure Notes (Signed)
Spinal  Patient location during procedure: OR Start time: 05/29/2019 2:20 PM End time: 05/29/2019 2:30 PM Staffing Anesthesiologist: Myrtie Soman, MD Performed: anesthesiologist  Preanesthetic Checklist Completed: patient identified, site marked, surgical consent, pre-op evaluation, timeout performed, IV checked, risks and benefits discussed and monitors and equipment checked Spinal Block Patient position: sitting Prep: Betadine Patient monitoring: heart rate, continuous pulse ox and blood pressure Location: L3-4 Injection technique: single-shot Needle Needle type: Spinocan  Needle gauge: 22 G Needle length: 9 cm Additional Notes Expiration date of kit checked and confirmed. Patient tolerated procedure well, without complications.

## 2019-05-29 NOTE — Plan of Care (Signed)

## 2019-05-29 NOTE — Evaluation (Signed)
Physical Therapy Evaluation Patient Details Name: Corey Aguilar MRN: [id number] DOB: 05-24-1934 Today's Date: 05/29/2019   History of Present Illness  L DA-THA  Clinical Impression  Pt is s/p THA resulting in the deficits listed below (see PT Problem List). Pt ambulated 27' with RW, initiated THA HEP. Good progress expected.  Pt will benefit from skilled PT to increase their independence and safety with mobility to allow discharge to the venue listed below.      Follow Up Recommendations Follow surgeon's recommendation for DC plan and follow-up therapies    Equipment Recommendations  3in1 (PT)    Recommendations for Other Services       Precautions / Restrictions Precautions Precautions: Fall Precaution Comments: R AFO 2* h/o foot drop Restrictions Weight Bearing Restrictions: No Other Position/Activity Restrictions: WBAT      Mobility  Bed Mobility Overal bed mobility: Modified Independent             General bed mobility comments: HOB up  Transfers Overall transfer level: Needs assistance Equipment used: Rolling walker (2 wheeled) Transfers: Sit to/from Stand Sit to Stand: Min guard         General transfer comment: VCs hand placement  Ambulation/Gait Ambulation/Gait assistance: Min guard Gait Distance (Feet): 60 Feet Assistive device: Rolling walker (2 wheeled) Gait Pattern/deviations: Step-to pattern;Decreased stride length Gait velocity: decr   General Gait Details: VCs sequencing, no loss of balance  Stairs            Wheelchair Mobility    Modified Rankin (Stroke Patients Only)       Balance Overall balance assessment: Modified Independent                                           Pertinent Vitals/Pain Pain Assessment: 0-10 Pain Score: 3  Pain Location: L hip Pain Descriptors / Indicators: Sore Pain Intervention(s): Limited activity within patient's tolerance;Monitored during session;Premedicated before  session;Ice applied    Home Living Family/patient expects to be discharged to:: Private residence Living Arrangements: Spouse/significant other Available Help at Discharge: Family;Available 24 hours/day Type of Home: House Home Access: Stairs to enter   [employer] of Steps: 2 Home Layout: Two level;Able to live on main level with bedroom/bathroom Home Equipment: Gilford Rile - 2 wheels;Cane - single point;Other (comment)(R AFO)      Prior Function Level of Independence: Independent with assistive device(s)         Comments: walks with R AFO and cane     Hand Dominance        Extremity/Trunk Assessment   Upper Extremity Assessment Upper Extremity Assessment: Overall WFL for tasks assessed    Lower Extremity Assessment Lower Extremity Assessment: LLE deficits/detail(h/o R foot drop) LLE Deficits / Details: knee ext 3/5, hip AAROM WFL, hip ~+2/5 LLE Sensation: WNL       Communication   Communication: HOH  Cognition Arousal/Alertness: Awake/alert Behavior During Therapy: WFL for tasks assessed/performed Overall Cognitive Status: Within Functional Limits for tasks assessed                                        General Comments      Exercises Total Joint Exercises Ankle Circles/Pumps: AROM;10 reps;Supine;Left Heel Slides: AAROM;Left;10 reps;Supine Hip ABduction/ADduction: AAROM;Left;10 reps;Supine   Assessment/Plan    PT Assessment Patient  needs continued PT services  PT Problem List Decreased strength;Decreased range of motion;Decreased activity tolerance;Decreased mobility;Pain;Decreased knowledge of use of DME       PT Treatment Interventions DME instruction;Gait training;Stair training;Functional mobility training;Therapeutic exercise;Therapeutic activities    PT Goals (Current goals can be found in the Care Plan section)  Acute Rehab PT Goals 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 PT Goal Formulation: With patient Time  For Goal Achievement: 06/05/19 Potential to Achieve Goals: Good    Frequency 7X/week   Barriers to discharge        Co-evaluation               AM-PAC PT "6 Clicks" Mobility  Outcome Measure Help needed turning from your back to your side while in a flat bed without using bedrails?: A Little Help needed moving from lying on your back to sitting on the side of a flat bed without using bedrails?: A Little Help needed moving to and from a bed to a chair (including a wheelchair)?: A Little Help needed standing up from a chair using your arms (e.g., wheelchair or bedside chair)?: A Little Help needed to walk in hospital room?: A Little Help needed climbing 3-5 steps with a railing? : A Lot 6 Click Score: 17    End of Session Equipment Utilized During Treatment: Gait belt Activity Tolerance: Patient tolerated treatment well Patient left: in chair;with call bell/phone within reach Nurse Communication: Mobility status PT Visit Diagnosis: Difficulty in walking, not elsewhere classified (R26.2);Pain Pain - Right/Left: Left Pain - part of body: Hip    Time: 1829-1850 PT Time Calculation (min) (ACUTE ONLY): 21 min   Charges:   PT Evaluation $PT Eval Low Complexity: 1 Low          Blondell Reveal Kistler PT 05/29/2019  Acute Rehabilitation Services Pager [phone] Office [phone]

## 2019-05-29 NOTE — Discharge Instructions (Signed)
INSTRUCTIONS AFTER JOINT REPLACEMENT  ° °o Remove items at home which could result in a fall. This includes throw rugs or furniture in walking pathways °o ICE to the affected joint every three hours while awake for 30 minutes at a time, for at least the first 3-5 days, and then as needed for pain and swelling.  Continue to use ice for pain and swelling. You may notice swelling that will progress down to the foot and ankle.  This is normal after surgery.  Elevate your leg when you are not up walking on it.   °o Continue to use the breathing machine you got in the hospital (incentive spirometer) which will help keep your temperature down.  It is common for your temperature to cycle up and down following surgery, especially at night when you are not up moving around and exerting yourself.  The breathing machine keeps your lungs expanded and your temperature down. ° ° °DIET:  As you were doing prior to hospitalization, we recommend a well-balanced diet. ° °DRESSING / WOUND CARE / SHOWERING ° °Keep the surgical dressing until follow up.  The dressing is water proof, so you can shower without any extra covering.  IF THE DRESSING FALLS OFF or the wound gets wet inside, change the dressing with sterile gauze.  Please use good hand washing techniques before changing the dressing.  Do not use any lotions or creams on the incision until instructed by your surgeon.   ° °ACTIVITY ° °o Increase activity slowly as tolerated, but follow the weight bearing instructions below.   °o No driving for 6 weeks or until further direction given by your physician.  You cannot drive while taking narcotics.  °o No lifting or carrying greater than 10 lbs. until further directed by your surgeon. °o Avoid periods of inactivity such as sitting longer than an hour when not asleep. This helps prevent blood clots.  °o You may return to work once you are authorized by your doctor.  ° ° ° °WEIGHT BEARING  ° °Weight bearing as tolerated with assist  device (walker, cane, etc) as directed, use it as long as suggested by your surgeon or therapist, typically at least 4-6 weeks. ° ° °EXERCISES ° °Results after joint replacement surgery are often greatly improved when you follow the exercise, range of motion and muscle strengthening exercises prescribed by your doctor. Safety measures are also important to protect the joint from further injury. Any time any of these exercises cause you to have increased pain or swelling, decrease what you are doing until you are comfortable again and then slowly increase them. If you have problems or questions, call your caregiver or physical therapist for advice.  ° °Rehabilitation is important following a joint replacement. After just a few days of immobilization, the muscles of the leg can become weakened and shrink (atrophy).  These exercises are designed to build up the tone and strength of the thigh and leg muscles and to improve motion. Often times heat used for twenty to thirty minutes before working out will loosen up your tissues and help with improving the range of motion but do not use heat for the first two weeks following surgery (sometimes heat can increase post-operative swelling).  ° °These exercises can be done on a training (exercise) mat, on the floor, on a table or on a bed. Use whatever works the best and is most comfortable for you.    Use music or television while you are exercising so that   the exercises are a pleasant break in your day. This will make your life better with the exercises acting as a break in your routine that you can look forward to.   Perform all exercises about fifteen times, three times per day or as directed.  You should exercise both the operative leg and the other leg as well. ° °Exercises include: °  °• Quad Sets - Tighten up the muscle on the front of the thigh (Quad) and hold for 5-10 seconds.   °• Straight Leg Raises - With your knee straight (if you were given a brace, keep it on),  lift the leg to 60 degrees, hold for 3 seconds, and slowly lower the leg.  Perform this exercise against resistance later as your leg gets stronger.  °• Leg Slides: Lying on your back, slowly slide your foot toward your buttocks, bending your knee up off the floor (only go as far as is comfortable). Then slowly slide your foot back down until your leg is flat on the floor again.  °• Angel Wings: Lying on your back spread your legs to the side as far apart as you can without causing discomfort.  °• Hamstring Strength:  Lying on your back, push your heel against the floor with your leg straight by tightening up the muscles of your buttocks.  Repeat, but this time bend your knee to a comfortable angle, and push your heel against the floor.  You may put a pillow under the heel to make it more comfortable if necessary.  ° °A rehabilitation program following joint replacement surgery can speed recovery and prevent re-injury in the future due to weakened muscles. Contact your doctor or a physical therapist for more information on knee rehabilitation.  ° ° °CONSTIPATION ° °Constipation is defined medically as fewer than three stools per week and severe constipation as less than one stool per week.  Even if you have a regular bowel pattern at home, your normal regimen is likely to be disrupted due to multiple reasons following surgery.  Combination of anesthesia, postoperative narcotics, change in appetite and fluid intake all can affect your bowels.  ° °YOU MUST use at least one of the following options; they are listed in order of increasing strength to get the job done.  They are all available over the counter, and you may need to use some, POSSIBLY even all of these options:   ° °Drink plenty of fluids (prune juice may be helpful) and high fiber foods °Colace 100 mg by mouth twice a day  °Senokot for constipation as directed and as needed Dulcolax (bisacodyl), take with full glass of water  °Miralax (polyethylene glycol)  once or twice a day as needed. ° °If you have tried all these things and are unable to have a bowel movement in the first 3-4 days after surgery call either your surgeon or your primary doctor.   ° °If you experience loose stools or diarrhea, hold the medications until you stool forms back up.  If your symptoms do not get better within 1 week or if they get worse, check with your doctor.  If you experience "the worst abdominal pain ever" or develop nausea or vomiting, please contact the office immediately for further recommendations for treatment. ° ° °ITCHING:  If you experience itching with your medications, try taking only a single pain pill, or even half a pain pill at a time.  You can also use Benadryl over the counter for itching or also to   help with sleep.   TED HOSE STOCKINGS:  Use stockings on both legs until for at least 2 weeks or as directed by physician office. They may be removed at night for sleeping.  MEDICATIONS:  See your medication summary on the After Visit Summary that nursing will review with you.  You may have some home medications which will be placed on hold until you complete the course of blood thinner medication.  It is important for you to complete the blood thinner medication as prescribed.  PRECAUTIONS:  If you experience chest pain or shortness of breath - call 911 immediately for transfer to the hospital emergency department.   If you develop a fever greater that 101 F, purulent drainage from wound, increased redness or drainage from wound, foul odor from the wound/dressing, or calf pain - CONTACT YOUR SURGEON.                                                   FOLLOW-UP APPOINTMENTS:  If you do not already have a post-op appointment, please call the office for an appointment to be seen by your surgeon.  Guidelines for how soon to be seen are listed in your After Visit Summary, but are typically between 1-4 weeks after surgery.  OTHER INSTRUCTIONS:    MAKE SURE YOU:    Understand these instructions.   Get help right away if you are not doing well or get worse.    Thank you for letting us be a part of your medical care team.  It is a privilege we respect greatly.  We hope these instructions will help you stay on track for a fast and full recovery!

## 2019-05-29 NOTE — Anesthesia Procedure Notes (Signed)
Procedure Name: MAC Date/Time: 05/29/2019 2:19 PM Performed by: Niel Hummer, CRNA Pre-anesthesia Checklist: Patient identified, Emergency Drugs available, Suction available and Patient being monitored Patient Re-evaluated:Patient Re-evaluated prior to induction Oxygen Delivery Method: Simple face mask

## 2019-05-29 NOTE — Anesthesia Postprocedure Evaluation (Signed)
Anesthesia Post Note  Patient: Corey Aguilar  Procedure(s) Performed: TOTAL HIP ARTHROPLASTY ANTERIOR APPROACH (Left )     Patient location during evaluation: PACU Anesthesia Type: Spinal Level of consciousness: oriented and awake and alert Pain management: pain level controlled Vital Signs Assessment: post-procedure vital signs reviewed and stable Respiratory status: spontaneous breathing, respiratory function stable and patient connected to nasal cannula oxygen Cardiovascular status: blood pressure returned to baseline and stable Postop Assessment: no headache, no backache and no apparent nausea or vomiting Anesthetic complications: no    Last Vitals:  Vitals:   05/29/19 1630 05/29/19 1645  BP: 120/63   Pulse: (!) 54 60  Resp: 13 17  Temp:    SpO2: 100% 98%    Last Pain:  Vitals:   05/29/19 1621  TempSrc:   PainSc: Asleep                 Jairo Bellew S

## 2019-05-29 NOTE — Transfer of Care (Signed)
Immediate Anesthesia Transfer of Care Note  Patient: Corey Aguilar  Procedure(s) Performed: TOTAL HIP ARTHROPLASTY ANTERIOR APPROACH (Left )  Patient Location: PACU  Anesthesia Type:Spinal  Level of Consciousness: awake  Airway & Oxygen Therapy: Patient Spontanous Breathing and Patient connected to face mask oxygen  Post-op Assessment: Report given to RN and Post -op Vital signs reviewed and stable  Post vital signs: Reviewed and stable  Last Vitals:  Vitals Value Taken Time  BP    Temp    Pulse 53 05/29/19 1621  Resp 12 05/29/19 1621  SpO2 100 % 05/29/19 1621  Vitals shown include unvalidated device data.  Last Pain:  Vitals:   05/29/19 1226  TempSrc:   PainSc: 0-No pain      Patients Stated Pain Goal: 4 (74/[id number]/40 [id number])  Complications: No apparent anesthesia complications

## 2019-05-29 NOTE — Progress Notes (Signed)
Wife called unit, given pt room #  And Number for nurse's station.

## 2019-05-29 NOTE — Interval H&P Note (Signed)
History and Physical Interval Note:  05/29/2019 12:50 PM  Corey Aguilar  has presented today for surgery, with the diagnosis of Left hip osteoarthritis.  The various methods of treatment have been discussed with the patient and family. After consideration of risks, benefits and other options for treatment, the patient has consented to  Procedure(s) with comments: TOTAL HIP ARTHROPLASTY ANTERIOR APPROACH (Left) - 70 mins as a surgical intervention.  The patient's history has been reviewed, patient examined, no change in status, stable for surgery.  I have reviewed the patient's chart and labs.  Questions were answered to the patient's satisfaction.     Mauri Pole

## 2019-05-30 ENCOUNTER — Encounter (HOSPITAL_COMMUNITY): Payer: Self-pay | Admitting: Orthopedic Surgery

## 2019-05-30 DIAGNOSIS — E663 Overweight: Secondary | ICD-10-CM | POA: Diagnosis present

## 2019-05-30 LAB — CBC
HCT: 37 % — ABNORMAL LOW (ref 39.0–52.0)
Hemoglobin: 12.1 g/dL — ABNORMAL LOW (ref 13.0–17.0)
MCH: 31 pg (ref 26.0–34.0)
MCHC: 32.7 g/dL (ref 30.0–36.0)
MCV: 94.9 fL (ref 80.0–100.0)
Platelets: 145 10*3/uL — ABNORMAL LOW (ref 150–400)
RBC: 3.9 MIL/uL — ABNORMAL LOW (ref 4.22–5.81)
RDW: 13.3 % (ref 11.5–15.5)
WBC: 10 10*3/uL (ref 4.0–10.5)
nRBC: 0 % (ref 0.0–0.2)

## 2019-05-30 LAB — BASIC METABOLIC PANEL
Anion gap: 5 (ref 5–15)
BUN: 18 mg/dL (ref 8–23)
CO2: 25 mmol/L (ref 22–32)
Calcium: 8.4 mg/dL — ABNORMAL LOW (ref 8.9–10.3)
Chloride: 106 mmol/L (ref 98–111)
Creatinine, Ser: 0.78 mg/dL (ref 0.61–1.24)
GFR calc Af Amer: >60 mL/min (ref >60)
GFR calc non Af Amer: >60 mL/min (ref >60)
Glucose, Bld: 136 mg/dL — ABNORMAL HIGH (ref 70–99)
Potassium: 4.3 mmol/L (ref 3.5–5.1)
Sodium: 136 mmol/L (ref 135–145)

## 2019-05-30 MED ORDER — METHOCARBAMOL 500 MG PO TABS: 500.0000 mg | ORAL_TABLET | Freq: Four times a day (QID) | ORAL | 0 refills | Status: DC | PRN

## 2019-05-30 MED ORDER — ASPIRIN 81 MG PO CHEW: 81.0000 mg | CHEWABLE_TABLET | Freq: Two times a day (BID) | ORAL | 0 refills | Status: AC

## 2019-05-30 MED ORDER — TRAMADOL HCL 50 MG PO TABS: 50.0000 mg | ORAL_TABLET | Freq: Four times a day (QID) | ORAL | 0 refills | Status: DC | PRN

## 2019-05-30 MED ORDER — DOCUSATE SODIUM 100 MG PO CAPS: 100.0000 mg | ORAL_CAPSULE | Freq: Two times a day (BID) | ORAL | 0 refills | Status: DC

## 2019-05-30 MED ORDER — ACETAMINOPHEN 500 MG PO TABS: 1000.0000 mg | ORAL_TABLET | Freq: Three times a day (TID) | ORAL | 0 refills | Status: DC

## 2019-05-30 MED ORDER — FERROUS SULFATE 325 (65 FE) MG PO TABS: 325.0000 mg | ORAL_TABLET | Freq: Three times a day (TID) | ORAL | 0 refills | Status: DC

## 2019-05-30 MED ORDER — POLYETHYLENE GLYCOL 3350 17 G PO PACK: 17.0000 g | PACK | Freq: Two times a day (BID) | ORAL | 0 refills | Status: DC

## 2019-05-30 NOTE — Progress Notes (Signed)
Physical Therapy Treatment Patient Details Name: Corey Aguilar MRN: [id number] DOB: [birth year] Today's Date: 05/30/2019    History of Present Illness L DA-THA    PT Comments    Pt progressing well. Will see for a second session to review complete HEP as pt will not be getting any further PT after d/c    Follow Up Recommendations  Follow surgeon's recommendation for DC plan and follow-up therapies     Equipment Recommendations  3in1 (PT)    Recommendations for Other Services       Precautions / Restrictions Precautions Precautions: Fall Precaution Comments: R AFO 2* h/o foot drop, amb in house without brace Restrictions Weight Bearing Restrictions: No Other Position/Activity Restrictions: WBAT    Mobility  Bed Mobility               General bed mobility comments: pt in chair   Transfers Overall transfer level: Needs assistance Equipment used: Rolling walker (2 wheeled) Transfers: Sit to/from Stand Sit to Stand: Min guard;Min assist         General transfer comment: VCs hand placement. assist to steady on rising  Ambulation/Gait Ambulation/Gait assistance: Min guard Gait Distance (Feet): 80 Feet Assistive device: Rolling walker (2 wheeled) Gait Pattern/deviations: Step-to pattern;Decreased weight shift to left     General Gait Details: VCs sequencing, posture, step length   Stairs Stairs: Yes Stairs assistance: Min guard;Min assist Stair Management: No rails;Backwards;With walker;Step to pattern Number of Stairs: 1(x2) General stair comments: cues for technique and overall safety   Wheelchair Mobility    Modified Rankin (Stroke Patients Only)       Balance                                            Cognition Arousal/Alertness: Awake/alert Behavior During Therapy: WFL for tasks assessed/performed Overall Cognitive Status: Within Functional Limits for tasks assessed                                         Exercises      General Comments        Pertinent Vitals/Pain Pain Assessment: 0-10 Pain Score: 3  Pain Location: L hip Pain Descriptors / Indicators: Sore Pain Intervention(s): Limited activity within patient's tolerance;Monitored during session    Home Living                      Prior Function            PT Goals (current goals can now be found in the care plan section) Acute Rehab PT Goals 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 PT Goal Formulation: With patient Time For Goal Achievement: 06/05/19 Potential to Achieve Goals: Good Progress towards PT goals: Progressing toward goals    Frequency    7X/week      PT Plan Current plan remains appropriate    Co-evaluation              AM-PAC PT "6 Clicks" Mobility   Outcome Measure  Help needed turning from your back to your side while in a flat bed without using bedrails?: A Little Help needed moving from lying on your back to sitting on the side of a flat bed without using bedrails?: A Little Help needed moving  to and from a bed to a chair (including a wheelchair)?: A Little Help needed standing up from a chair using your arms (e.g., wheelchair or bedside chair)?: A Little Help needed to walk in hospital room?: A Little Help needed climbing 3-5 steps with a railing? : A Little 6 Click Score: 18    End of Session Equipment Utilized During Treatment: Gait belt Activity Tolerance: Patient tolerated treatment well Patient left: in chair;with call bell/phone within reach Nurse Communication: Mobility status PT Visit Diagnosis: Difficulty in walking, not elsewhere classified (R26.2);Pain Pain - Right/Left: Left Pain - part of body: Hip     Time: 1005-1019 PT Time Calculation (min) (ACUTE ONLY): 14 min  Charges:  $Gait Training: 8-22 mins                     Kenyon Ana, PT  Pager: [phone] Acute Rehab Dept [facility]): [phone]   05/30/2019    [facility] 05/30/2019,  10:59 AM

## 2019-05-30 NOTE — Progress Notes (Signed)
Discharge Plan of Care: -No PT Patient has RW

## 2019-05-30 NOTE — Plan of Care (Signed)

## 2019-05-30 NOTE — Progress Notes (Signed)
   05/30/19 1300  PT Visit Information  Last PT Received On 05/30/19---pt  Progressing well; would benefit from supervision for mobility for added safety  Assistance Needed +1  History of Present Illness Pt is an 85 y/o male s/p L THA-direct anterior on 05/29/19. PMH including but not limited to heart murmur and back surgery in 2015.  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  Precautions  Precautions Fall  Precaution Comments R AFO 2* h/o foot drop, amb in house without brace  Restrictions  Weight Bearing Restrictions No  Other Position/Activity Restrictions WBAT  Pain Assessment  Pain Assessment 0-10  Pain Score 2  Pain Location L hip  Pain Descriptors / Indicators Sore  Pain Intervention(s) Limited activity within patient's tolerance;Monitored during session  Cognition  Arousal/Alertness Awake/alert  Behavior During Therapy WFL for tasks assessed/performed  Overall Cognitive Status Within Functional Limits for tasks assessed  Bed Mobility  General bed mobility comments pt in chair   Transfers  Overall transfer level Needs assistance  Equipment used Rolling walker (2 wheeled)  Transfers Sit to/from Stand  Sit to Stand Supervision;Min guard  General transfer comment VCs hand placement. assist to steady on rising  Ambulation/Gait  Ambulation/Gait assistance Min guard;Supervision  Gait Distance (Feet) 10 Feet  Assistive device Rolling walker (2 wheeled)  Gait Pattern/deviations Step-to pattern;Decreased weight shift to left  General Gait Details VCs sequencing, posture, step length  Total Joint Exercises  Ankle Circles/Pumps AROM;Both;10 reps;Supine  Heel Slides AAROM;Left;10 reps  Hip ABduction/ADduction AAROM;AROM;Left;20 reps;Seated;Standing  Quad Sets AROM;Strengthening;Left;10 reps  Knee Flexion AROM;Left;10 reps;Standing  Marching in Standing AROM;10 reps;Standing  Standing Hip Extension AROM;10 reps;Left  PT - End of Session  Equipment Utilized  During Treatment Gait belt  Activity Tolerance Patient tolerated treatment well  Patient left in chair;with call bell/phone within reach;Other (comment) (pt in chair on arrival)  Nurse Communication Mobility status   PT - Assessment/Plan  PT Plan Current plan remains appropriate  PT Visit Diagnosis Other abnormalities of gait and mobility (R26.89)  PT Frequency (ACUTE ONLY) 7X/week  Follow Up Recommendations Follow surgeon's recommendation for DC plan and follow-up therapies;Supervision for mobility/OOB  PT equipment 3in1 (PT)  AM-PAC PT "6 Clicks" Mobility Outcome Measure (Version 2)  Help needed turning from your back to your side while in a flat bed without using bedrails? 3  Help needed moving from lying on your back to sitting on the side of a flat bed without using bedrails? 3  Help needed moving to and from a bed to a chair (including a wheelchair)? 3  Help needed standing up from a chair using your arms (e.g., wheelchair or bedside chair)? 3  Help needed to walk in hospital room? 3  Help needed climbing 3-5 steps with a railing?  3  6 Click Score 18  Consider Recommendation of Discharge To: Home with [facility]  PT Goal Progression  Progress towards PT goals Progressing toward goals  Acute Rehab PT Goals  PT Goal Formulation With patient  Time For Goal Achievement 06/05/19  Potential to Achieve Goals Good  PT Time Calculation  PT Start Time (ACUTE ONLY) 1214  PT Stop Time (ACUTE ONLY) 1229  PT Time Calculation (min) (ACUTE ONLY) 15 min  PT General Charges  $$ ACUTE PT VISIT 1 Visit  PT Treatments  $Therapeutic Exercise 8-22 mins

## 2019-05-30 NOTE — Progress Notes (Signed)
     Subjective: 1 Day Post-Op Procedure(s) (LRB): TOTAL HIP ARTHROPLASTY ANTERIOR APPROACH (Left)   Patient reports pain as mild, pain controlled. States that he feels it already better than prior to surgery.  No reported events throughout the night.  Dr. Alvan Dame discussed the procedure, findings and expectations moving forward.  Ready to be discharged home.     Objective:   VITALS:   Vitals:   05/30/19 0103 05/30/19 0414  BP: 133/83 113/72  Pulse: 70 70  Resp: 16 16  Temp: 98 F (36.7 C) 98 F (36.7 C)  SpO2: 96% 95%    Dorsiflexion/Plantar flexion intact Incision: dressing C/D/I No cellulitis present Compartment soft  LABS Recent Labs    05/30/19 0303  HGB 12.1*  HCT 37.0*  WBC 10.0  PLT 145*    Recent Labs    05/30/19 0303  NA 136  K 4.3  BUN 18  CREATININE 0.78  GLUCOSE 136*     Assessment/Plan: 1 Day Post-Op Procedure(s) (LRB): TOTAL HIP ARTHROPLASTY ANTERIOR APPROACH (Left) Foley cath d/c'ed Advance diet Up with therapy D/C IV fluids Discharge home Follow up in 2 weeks at [facility] ([place]). Follow up with OLIN,Karl Erway D in 2 weeks.  Contact information:  [facility]) [address] [place] [phone]    Overweight (BMI 25-29.9) Estimated body mass index is 28.02 kg/m as calculated from the following:   Height as of this encounter: 5\' 6"  (1.676 m).   Weight as of this encounter: 78.7 kg. Patient also counseled that weight may inhibit the healing process Patient counseled that losing weight will help with future health issues        Corey Aguilar. Corey Aguilar   PAC  05/30/2019, 8:01 AM

## 2019-05-30 NOTE — Progress Notes (Signed)
Physical Therapy Progress Note  Clinical Impression: Pt seen for LE strengthening exercises. PT provided HEP handout and reviewed in full with pt. PT provided pt education re: ice, positioning, generalized walking program and car transfers with demonstration. Pt would continue to benefit from skilled physical therapy services at this time while admitted and after d/c to address the below listed limitations in order to improve overall safety and independence with functional mobility.  Sherie Don, [place], DPT  Acute Rehabilitation Services Pager [phone] Office [phone]    05/30/19 1100  PT Visit Information  Last PT Received On 05/30/19  Assistance Needed +1  History of Present Illness Pt is an 85 y/o male s/p L THA-direct anterior on 05/29/19. PMH including but not limited to heart murmur and back surgery in 2015.  Precautions  Precautions Fall  Precaution Comments R AFO 2* h/o foot drop, amb in house without brace  Restrictions  Weight Bearing Restrictions Yes  LLE Weight Bearing WBAT  Pain Assessment  Pain Assessment 0-10  Pain Score 4  Pain Location L hip  Pain Descriptors / Indicators Sore  Pain Intervention(s) Monitored during session;Repositioned  Cognition  Arousal/Alertness Awake/alert  Behavior During Therapy WFL for tasks assessed/performed  Overall Cognitive Status Within Functional Limits for tasks assessed  Exercises  Exercises Total Joint  Total Joint Exercises  Ankle Circles/Pumps AROM;Both;10 reps;Supine  Heel Slides AAROM;Left;10 reps;Supine  Hip ABduction/ADduction AAROM;Left;10 reps;Supine  Quad Sets AROM;Strengthening;Left;10 reps;Supine  Short Arc Quad AROM;Strengthening;Left;10 reps;Supine  PT - End of Session  Activity Tolerance Patient tolerated treatment well  Patient left in bed;with call bell/phone within reach;with bed alarm set  Nurse Communication Mobility status   PT - Assessment/Plan  PT Plan Current plan remains appropriate  PT  Visit Diagnosis Other abnormalities of gait and mobility (R26.89)  PT Frequency (ACUTE ONLY) 7X/week  Follow Up Recommendations Supervision for mobility/OOB;Other (comment) (home with HEP)  PT equipment 3in1 (PT)  AM-PAC PT "6 Clicks" Mobility Outcome Measure (Version 2)  Help needed turning from your back to your side while in a flat bed without using bedrails? 3  Help needed moving from lying on your back to sitting on the side of a flat bed without using bedrails? 3  Help needed moving to and from a bed to a chair (including a wheelchair)? 3  Help needed standing up from a chair using your arms (e.g., wheelchair or bedside chair)? 3  Help needed to walk in hospital room? 3  Help needed climbing 3-5 steps with a railing?  3  6 Click Score 18  Consider Recommendation of Discharge To: Home with [facility]  PT Goal Progression  Progress towards PT goals Progressing toward goals  Acute Rehab PT Goals  PT Goal Formulation With patient  Time For Goal Achievement 06/05/19  Potential to Achieve Goals Good  PT Time Calculation  PT Start Time (ACUTE ONLY) 0935  PT Stop Time (ACUTE ONLY) 0949  PT Time Calculation (min) (ACUTE ONLY) 14 min  PT Treatments  $Therapeutic Exercise 8-22 mins

## 2019-06-05 NOTE — Discharge Summary (Signed)
Physician Discharge Summary  Patient ID: Corey Aguilar MRN: [id number] DOB/AGE: [birth year] 85 y.o.  Admit date: 05/29/2019 Discharge date: 05/30/2019   Procedures:  Procedure(s) (LRB): TOTAL HIP ARTHROPLASTY ANTERIOR APPROACH (Left)  Attending Physician:  Dr. Paralee Cancel   Admission Diagnoses:   Left hip primary OA / pain  Discharge Diagnoses:  Active Problems:   S/P left THA, AA   Overweight (BMI 25.0-29.9)  Past Medical History:  Diagnosis Date   BCC (basal cell carcinoma of skin) 08/13/2010   V of neck- (tx p bx)   BCC (basal cell carcinoma of skin) 12/11/2014   v of neck (CX35FU)   BCC (basal cell carcinoma of skin) 04/07/1989   left cheek   Heart murmur    Per patient, echocardiogram done was told nothing to be concerned about   History of kidney stones    Left hip pain    Lumbago    SCC (squamous cell carcinoma) 07/20/2011   left forearm (CX35FU)   SCC (squamous cell carcinoma) 10/05/2011   Left ear (CX35FU)   SCC (squamous cell carcinoma) 08/22/2018   left jawline (CX35FU)   Superficial basal cell carcinoma (BCC) 07/20/2011   tip of nose (CX35FU)    HPI:    Corey Aguilar, 85 y.o. male, has a history of pain and functional disability in the left hip due to arthritis and patient has failed non-surgical conservative treatments for greater than 12 weeks to include NSAID's and/or analgesics, corticosteriod injections, use of assistive devices and activity modification.  Onset of symptoms was gradual starting ~2 years ago with gradually worsening course since that time.The patient noted no past surgery on the left hip(s).  Patient currently rates pain in the left hip at 8 out of 10 with activity. Patient has night pain, worsening of pain with activity and weight bearing, trendelenberg gait, pain that interfers with activities of daily living and pain with passive range of motion. Patient has evidence of periarticular osteophytes and joint space narrowing  by imaging studies. This condition presents safety issues increasing the risk of falls. There is no current active infection.  Risks, benefits and expectations were discussed with the patient.  Risks including but not limited to the risk of anesthesia, blood clots, nerve damage, blood vessel damage, failure of the prosthesis, infection and up to and including death.  Patient understand the risks, benefits and expectations and wishes to proceed with surgery.   PCP: Corey Blitz, MD   Discharged Condition: good  Hospital Course:  Patient underwent the above stated procedure on 05/29/2019. Patient tolerated the procedure well and brought to the recovery room in good condition and subsequently to the floor.  POD #1 BP: 113/72 ; Pulse: 70 ; Temp: 98 F (36.7 C) ; Resp: 16 Patient reports pain as mild, pain controlled. States that he feels it already better than prior to surgery.  No reported events throughout the night.  Dr. Alvan Dame discussed the procedure, findings and expectations moving forward.  Ready to be discharged home.  Dorsiflexion/plantar flexion intact, incision: dressing C/D/I, no cellulitis present and compartment soft.   LABS  Basename    HGB     12.1  HCT     37.0    Discharge Exam: General appearance: alert, cooperative and no distress Extremities: Homans sign is negative, no sign of DVT, no edema, redness or tenderness in the calves or thighs and no ulcers, gangrene or trophic changes  Disposition:  Home with follow up in 2 weeks   Follow-up  Information    Paralee Cancel, MD. Schedule an appointment as soon as possible for a visit in 2 weeks.   Specialty: Orthopedic Surgery Contact information: [address] [place] [address] [phone]           Discharge Instructions    Call MD / Call 911   Complete by: As directed    If you experience chest pain or shortness of breath, CALL 911 and be transported to the hospital emergency room.  If you  develope a fever above 101 F, pus (white drainage) or increased drainage or redness at the wound, or calf pain, call your surgeon's office.   Change dressing   Complete by: As directed    Maintain surgical dressing until follow up in the clinic. If the edges start to pull up, may reinforce with tape. If the dressing is no longer working, may remove and cover with gauze and tape, but must keep the area dry and clean.  Call with any questions or concerns.   Constipation Prevention   Complete by: As directed    Drink plenty of fluids.  Prune juice may be helpful.  You may use a stool softener, such as Colace (over the counter) 100 mg twice a day.  Use MiraLax (over the counter) for constipation as needed.   Diet - low sodium heart healthy   Complete by: As directed    Discharge instructions   Complete by: As directed    Maintain surgical dressing until follow up in the clinic. If the edges start to pull up, may reinforce with tape. If the dressing is no longer working, may remove and cover with gauze and tape, but must keep the area dry and clean.  Follow up in 2 weeks at [facility]. Call with any questions or concerns.   Increase activity slowly as tolerated   Complete by: As directed    Weight bearing as tolerated with assist device (walker, cane, etc) as directed, use it as long as suggested by your surgeon or therapist, typically at least 4-6 weeks.   TED hose   Complete by: As directed    Use stockings (TED hose) for 2 weeks on both leg(s).  You may remove them at night for sleeping.      Allergies as of 05/30/2019      Reactions   Penicillins    Did it involve swelling of the face/tongue/throat, SOB, or low BP? No Did it involve sudden or severe rash/hives, skin peeling, or any reaction on the inside of your mouth or nose? Yes Did you need to seek medical attention at a hospital or doctor's office? No When did it last happen?childhood If all above answers are NO,  may proceed with cephalosporin use.   Ibuprofen Rash      Medication List    STOP taking these medications   GOODYS EXTRA STRENGTH PO   polyethylene glycol powder 17 GM/SCOOP powder Commonly known as: MiraLax Replaced by: polyethylene glycol 17 g packet     TAKE these medications   acetaminophen 500 MG tablet Commonly known as: TYLENOL Take 2 tablets (1,000 mg total) by mouth every 8 (eight) hours.   aspirin 81 MG chewable tablet Commonly known as: Aspirin Childrens Chew 1 tablet (81 mg total) by mouth 2 (two) times daily. Take for 4 weeks, then resume regular dose.   docusate sodium 100 MG capsule Commonly known as: Colace Take 1 capsule (100 mg total) by mouth 2 (two) times daily.  ferrous sulfate 325 (65 FE) MG tablet Commonly known as: FerrouSul Take 1 tablet (325 mg total) by mouth 3 (three) times daily with meals for 14 days.   methocarbamol 500 MG tablet Commonly known as: Robaxin Take 1 tablet (500 mg total) by mouth every 6 (six) hours as needed for muscle spasms.   polyethylene glycol 17 g packet Commonly known as: MIRALAX / GLYCOLAX Take 17 g by mouth 2 (two) times daily. Replaces: polyethylene glycol powder 17 GM/SCOOP powder   traMADol 50 MG tablet Commonly known as: Ultram Take 1-2 tablets (50-100 mg total) by mouth every 6 (six) hours as needed.            Discharge Care Instructions  (From admission, onward)         Start     Ordered   05/30/19 0000  Change dressing    Comments: Maintain surgical dressing until follow up in the clinic. If the edges start to pull up, may reinforce with tape. If the dressing is no longer working, may remove and cover with gauze and tape, but must keep the area dry and clean.  Call with any questions or concerns.   05/30/19 0804           Signed: [place] Pugh. Ceylin Dreibelbis   PA-C  06/05/2019, 8:06 AM

## 2019-07-11 DIAGNOSIS — M21371 Foot drop, right foot: Secondary | ICD-10-CM | POA: Diagnosis not present

## 2019-07-11 DIAGNOSIS — Z471 Aftercare following joint replacement surgery: Secondary | ICD-10-CM | POA: Diagnosis not present

## 2019-07-11 DIAGNOSIS — Z96642 Presence of left artificial hip joint: Secondary | ICD-10-CM | POA: Diagnosis not present

## 2019-09-03 DIAGNOSIS — M21371 Foot drop, right foot: Secondary | ICD-10-CM | POA: Diagnosis not present

## 2019-10-17 DIAGNOSIS — M545 Low back pain: Secondary | ICD-10-CM | POA: Diagnosis not present

## 2019-10-17 DIAGNOSIS — E7800 Pure hypercholesterolemia, unspecified: Secondary | ICD-10-CM | POA: Diagnosis not present

## 2019-10-17 DIAGNOSIS — Z6827 Body mass index (BMI) 27.0-27.9, adult: Secondary | ICD-10-CM | POA: Diagnosis not present

## 2019-10-17 DIAGNOSIS — I10 Essential (primary) hypertension: Secondary | ICD-10-CM | POA: Diagnosis not present

## 2019-10-17 DIAGNOSIS — R011 Cardiac murmur, unspecified: Secondary | ICD-10-CM | POA: Diagnosis not present

## 2019-10-17 DIAGNOSIS — Z2821 Immunization not carried out because of patient refusal: Secondary | ICD-10-CM | POA: Diagnosis not present

## 2019-10-17 DIAGNOSIS — Z299 Encounter for prophylactic measures, unspecified: Secondary | ICD-10-CM | POA: Diagnosis not present

## 2019-10-29 DIAGNOSIS — R011 Cardiac murmur, unspecified: Secondary | ICD-10-CM | POA: Diagnosis not present

## 2019-11-08 DIAGNOSIS — I10 Essential (primary) hypertension: Secondary | ICD-10-CM | POA: Diagnosis not present

## 2019-11-08 DIAGNOSIS — Z299 Encounter for prophylactic measures, unspecified: Secondary | ICD-10-CM | POA: Diagnosis not present

## 2019-11-08 DIAGNOSIS — I350 Nonrheumatic aortic (valve) stenosis: Secondary | ICD-10-CM | POA: Diagnosis not present

## 2019-11-08 DIAGNOSIS — R011 Cardiac murmur, unspecified: Secondary | ICD-10-CM | POA: Diagnosis not present

## 2019-11-08 DIAGNOSIS — Z6827 Body mass index (BMI) 27.0-27.9, adult: Secondary | ICD-10-CM | POA: Diagnosis not present

## 2019-11-12 DIAGNOSIS — I350 Nonrheumatic aortic (valve) stenosis: Secondary | ICD-10-CM | POA: Diagnosis not present

## 2019-11-12 DIAGNOSIS — I10 Essential (primary) hypertension: Secondary | ICD-10-CM | POA: Diagnosis not present

## 2019-11-14 DIAGNOSIS — I350 Nonrheumatic aortic (valve) stenosis: Secondary | ICD-10-CM | POA: Diagnosis not present

## 2019-11-14 DIAGNOSIS — Z6827 Body mass index (BMI) 27.0-27.9, adult: Secondary | ICD-10-CM | POA: Diagnosis not present

## 2019-11-14 DIAGNOSIS — E7800 Pure hypercholesterolemia, unspecified: Secondary | ICD-10-CM | POA: Diagnosis not present

## 2019-11-14 DIAGNOSIS — I10 Essential (primary) hypertension: Secondary | ICD-10-CM | POA: Diagnosis not present

## 2019-11-14 DIAGNOSIS — I5189 Other ill-defined heart diseases: Secondary | ICD-10-CM | POA: Diagnosis not present

## 2019-11-14 DIAGNOSIS — Z87891 Personal history of nicotine dependence: Secondary | ICD-10-CM | POA: Diagnosis not present

## 2019-11-14 DIAGNOSIS — Z299 Encounter for prophylactic measures, unspecified: Secondary | ICD-10-CM | POA: Diagnosis not present

## 2019-11-20 ENCOUNTER — Encounter: Payer: Self-pay | Admitting: *Deleted

## 2019-11-21 ENCOUNTER — Encounter: Payer: Self-pay | Admitting: Cardiovascular Disease

## 2019-11-21 ENCOUNTER — Ambulatory Visit (INDEPENDENT_AMBULATORY_CARE_PROVIDER_SITE_OTHER): Payer: PPO | Admitting: Cardiovascular Disease

## 2019-11-21 ENCOUNTER — Other Ambulatory Visit: Payer: Self-pay

## 2019-11-21 ENCOUNTER — Telehealth: Payer: Self-pay | Admitting: Cardiovascular Disease

## 2019-11-21 VITALS — BP 118/81 | HR 80 | Ht 67.0 in | Wt 174.8 lb

## 2019-11-21 DIAGNOSIS — I350 Nonrheumatic aortic (valve) stenosis: Secondary | ICD-10-CM | POA: Diagnosis not present

## 2019-11-21 NOTE — Telephone Encounter (Signed)
°  Precert needed for: Echo   Location: [facility]    Date: April 23, 2020

## 2019-11-21 NOTE — Progress Notes (Signed)
CARDIOLOGY CONSULT NOTE  Patient ID: Corey Aguilar MRN: [id number] DOB/AGE: 05/12/1934 86 y.o.  Admit date: (Not on file) Primary Physician: Monico Blitz, MD  Reason for Consultation: Severe aortic stenosis  HPI: Corey Aguilar is a 86 y.o. male who is being seen today for the evaluation of severe aortic stenosis at the request of Monico Blitz, MD.   I reviewed an echocardiogram report from an outside facility dated 10/29/2019.  It demonstrated normal LV systolic function, LVEF 60 to 123456, diastolic dysfunction, severely calcified aortic valve leaflets, mild aortic regurgitation, and severe aortic valve stenosis.  There was also mild calcification of the mitral valve leaflets and moderate mitral annular calcification.  It appears he was also evaluated by Dr. Candis Musa with [facility] on 11/12/2019.  Review of the electronic medical record indicates he has severe symptomatic aortic valve stenosis and TAVR in conjunction with cardiothoracic surgery evaluation was discussed.  An evaluation at [facility] was mentioned in the notes.  However, upon speaking with him, he denies chest pain, palpitations, shortness of breath, leg swelling, orthopnea, dizziness, and syncope.  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  He stays very active and build a fence this morning until early this afternoon without any limiting symptoms.  He feels his energy levels are just as they were 1 year ago.  He has a dropfoot in the right leg which occurred about 15 to 20 years ago due to lower back problems.   Allergies  Allergen Reactions  . Penicillins     Did it involve swelling of the face/tongue/throat, SOB, or low BP? No Did it involve sudden or severe rash/hives, skin peeling, or any reaction on the inside of your mouth or nose? Yes Did you need to seek medical attention at a hospital or doctor's office? No When did it last happen?childhood If all above answers are "NO", may proceed with  cephalosporin use.  . Ibuprofen Rash    Current Outpatient Medications  Medication Sig Dispense Refill  . acetaminophen (TYLENOL) 500 MG tablet Take 2 tablets (1,000 mg total) by mouth every 8 (eight) hours. 30 tablet 0  . Sennosides (EX-LAX) 15 MG TABS Take 1 tablet by mouth daily as needed.     No current facility-administered medications for this visit.    Past Medical History:  Diagnosis Date  . BCC (basal cell carcinoma of skin) 08/13/2010   V of neck- (tx p bx)  . BCC (basal cell carcinoma of skin) 12/11/2014   v of neck (CX35FU)  . BCC (basal cell carcinoma of skin) 04/07/1989   left cheek  . Heart murmur    Per patient, echocardiogram done was told nothing to be concerned about  . History of kidney stones   . Left hip pain   . Lumbago   . SCC (squamous cell carcinoma) 07/20/2011   left forearm (CX35FU)  . SCC (squamous cell carcinoma) 10/05/2011   Left ear (CX35FU)  . SCC (squamous cell carcinoma) 08/22/2018   left jawline (CX35FU)  . Superficial basal cell carcinoma (BCC) 07/20/2011   tip of nose (CX35FU)    Past Surgical History:  Procedure Laterality Date  . BACK SURGERY  2015  . TOTAL HIP ARTHROPLASTY Left 05/29/2019   Procedure: TOTAL HIP ARTHROPLASTY ANTERIOR APPROACH;  Surgeon: Paralee Cancel, MD;  Location: [facility];  Service: Orthopedics;  Laterality: Left;  70 mins    Social History   Socioeconomic History  . Marital status: Married  Spouse name: Not on file  . Number of children: Not on file  . Years of education: Not on file  . Highest education level: Not on file  Occupational History  . Not on file  Tobacco Use  . Smoking status: Former Smoker    Quit date: 1957    Years since quitting: 64.0  . Smokeless tobacco: Never Used  Substance and Sexual Activity  . Alcohol use: Yes    Comment: occasional wine with a meal  . Drug use: Never  . Sexual activity: Not on file  Other Topics Concern  . Not on file  Social History Narrative   3  grown children.       Patient has limited literacy.    Social Determinants of Health   Financial Resource Strain:   . Difficulty of Paying Living Expenses: Not on file  Food Insecurity:   . Worried About Charity fundraiser in the Last Year: Not on file  . Ran Out of Food in the Last Year: Not on file  Transportation Needs:   . Lack of Transportation (Medical): Not on file  . Lack of Transportation (Non-Medical): Not on file  Physical Activity:   . Days of Exercise per Week: Not on file  . Minutes of Exercise per Session: Not on file  Stress:   . Feeling of Stress : Not on file  Social Connections:   . Frequency of Communication with Friends and Family: Not on file  . Frequency of Social Gatherings with Friends and Family: Not on file  . Attends Religious Services: Not on file  . Active Member of Clubs or Organizations: Not on file  . Attends Archivist Meetings: Not on file  . Marital Status: Not on file  Intimate Partner Violence:   . Fear of Current or Ex-Partner: Not on file  . Emotionally Abused: Not on file  . Physically Abused: Not on file  . Sexually Abused: Not on file     No family history of premature CAD in 1st degree relatives.  Current Meds  Medication Sig  . acetaminophen (TYLENOL) 500 MG tablet Take 2 tablets (1,000 mg total) by mouth every 8 (eight) hours.  . Sennosides (EX-LAX) 15 MG TABS Take 1 tablet by mouth daily as needed.      Review of systems complete and found to be negative unless listed above in HPI    Physical exam Blood pressure 118/81, pulse 80, height 5\' 7"  (1.702 m), weight 174 lb 12.8 oz (79.3 kg), SpO2 96 %. General: NAD Neck: No JVD, no thyromegaly or thyroid nodule.  Lungs: Clear to auscultation bilaterally with normal respiratory effort. CV: Nondisplaced PMI. Regular rate and rhythm, normal S1/diminished S2, no S3/S4, harsh 4/6 ejection systolic murmur loudest over right upper sternal border.  No peripheral edema.   No carotid bruit.    Abdomen: Soft, nontender, no distention.  Skin: Intact without lesions or rashes.  Neurologic: Alert and oriented x 3.  Psych: Normal affect. Extremities: No clubbing or cyanosis.  HEENT: Normal.   ECG: Most recent ECG reviewed.   Labs: Lab Results  Component Value Date/Time   K 4.3 05/30/2019 03:03 AM   BUN 18 05/30/2019 03:03 AM   CREATININE 0.78 05/30/2019 03:03 AM   HGB 12.1 (L) 05/30/2019 03:03 AM     Lipids: No results found for: LDLCALC, LDLDIRECT, CHOL, TRIG, HDL      ASSESSMENT AND PLAN:  1.  Severe aortic stenosis: It appears he is  entirely asymptomatic from this standpoint.  He stays very active outdoors without any exercise limiting symptoms.  I will obtain a follow-up echocardiogram in June 2021.  I plan to follow-up with him in the office in July 2021.  I warned him of symptoms such as chest pain, shortness of breath, leg swelling, dizziness, and syncope.  I told him if he would experience any of the symptoms to contact me soon as possible.    Disposition: Follow up July 2021  Signed: Kate Sable, M.D., F.A.C.C.  11/21/2019, 3:07 PM

## 2019-11-21 NOTE — Patient Instructions (Addendum)
Medication Instructions:    Your physician recommends that you continue on your current medications as directed. Please refer to the Current Medication list given to you today.  Labwork:  NONE  Testing/Procedures: Your physician has requested that you have an echocardiogram I June 2021. Echocardiography is a painless test that uses sound waves to create images of your heart. It provides your doctor with information about the size and shape of your heart and how well your heart's chambers and valves are working. This procedure takes approximately one hour. There are no restrictions for this procedure.  Follow-Up:  Your physician recommends that you schedule a follow-up appointment in: 6 months (office). You will receive a reminder letter in the mail in about 4 months reminding you to call and schedule your appointment. If you don't receive this letter, please contact our office.  Any Other Special Instructions Will Be Listed Below (If Applicable).  If you need a refill on your cardiac medications before your next appointment, please call your pharmacy.

## 2019-11-26 ENCOUNTER — Telehealth: Payer: Self-pay | Admitting: Cardiovascular Disease

## 2019-11-26 NOTE — Telephone Encounter (Signed)
Pt says for the last few weeks off and on tingling in the fingers and toes with numbness for a few hours at the time - says with massage feeling comes back - more on the left side - denies any discoloration in hands or feet

## 2019-11-26 NOTE — Telephone Encounter (Signed)
If not already checked by PCP, would have him speak to his PCP about checking vitamin and electrolyte levels.

## 2019-11-26 NOTE — Telephone Encounter (Signed)
Pt voiced understanding and will contact pcp

## 2019-11-26 NOTE — Telephone Encounter (Signed)
Fingers tingle every day and forgot to let the Doctor know at the time of his visit

## 2019-12-05 ENCOUNTER — Other Ambulatory Visit: Payer: PPO

## 2020-01-03 ENCOUNTER — Telehealth: Payer: Self-pay | Admitting: Cardiovascular Disease

## 2020-01-03 NOTE — Telephone Encounter (Signed)
Pt c/o tingling/numbness/throbbing in both arms with throbbing so bad it has woke him up at night, also hands and fingers tingling and numbness that has gotten worse - also had a few episodes of chest pain while working around the house last a few minutes at a time. Seen Dr Manuella Ghazi regarding this as per last phone note and was told to wear a brace around his wrist that he could have carpal tunnel but the brace hasn't helped and symptoms have gotten worse - denies swelling/dizziness/weight gain - some SOB while doing normal household chores

## 2020-01-03 NOTE — Telephone Encounter (Signed)
Wife Mardene Celeste) called stating that patient continues to have tingling in the arms and fingers. States that the pain in worse now.  Saw PCP- Dr. Manuella Ghazi and was told to get a carpal tunnel wrap.

## 2020-01-04 NOTE — Telephone Encounter (Signed)
Wife Mardene Celeste) notified.  Stated that she already has appointment scheduled for January 07, 2020 with Dr. Julianne Handler in [facility] office.

## 2020-01-04 NOTE — Telephone Encounter (Signed)
It appears he may have developed symptomatic severe aortic stenosis (symptoms of chest pain and exertional dyspnea, both of which he denied at his initial office visit with me).  Please make a referral to the structural heart clinic.

## 2020-01-04 NOTE — Telephone Encounter (Signed)
Pt has been scheduled with Dr Angelena Form on Monday, March 1st at 11:40 AM for Structural Heart Evaluation.  Pt and wife are aware of appointment.

## 2020-01-07 ENCOUNTER — Ambulatory Visit: Payer: PPO | Admitting: Cardiovascular Disease

## 2020-01-07 ENCOUNTER — Encounter: Payer: Self-pay | Admitting: Cardiovascular Disease

## 2020-01-07 ENCOUNTER — Other Ambulatory Visit: Payer: Self-pay

## 2020-01-07 VITALS — BP 156/80 | HR 65 | Ht 67.0 in | Wt 176.0 lb

## 2020-01-07 DIAGNOSIS — I350 Nonrheumatic aortic (valve) stenosis: Secondary | ICD-10-CM

## 2020-01-07 NOTE — Patient Instructions (Addendum)
Medication Instructions:  Your provider recommends that you continue on your current medications as directed. Please refer to the Current Medication list given to you today.   *If you need a refill on your cardiac medications before your next appointment, please call your pharmacy*  Testing/Procedures: Your physician has requested that you have an echocardiogram. Echocardiography is a painless test that uses sound waves to create images of your heart. It provides your doctor with information about the size and shape of your heart and how well your heart's chambers and valves are working. This procedure takes approximately one hour. There are no restrictions for this procedure. Please keep your echo as scheduled in June in [place].  Follow-Up: You have a follow-up appointment with Dr. Angelena Form 04/30/20 at 11:40AM.

## 2020-01-07 NOTE — Addendum Note (Signed)
Addended by: Mendel Ryder on: 01/07/2020 04:46 PM   Modules accepted: Orders

## 2020-01-07 NOTE — Progress Notes (Signed)
Structural Heart Clinic Consult Note  Chief Complaint  Patient presents with  . New Patient (Initial Visit)    Severe aortic stenosis    History of Present Illness: 86 yo male with history of skin cancer, nephrolithiasis and aortic stenosis who is here today as a new consult in the structural heart clinic for further evaluation of his aortic stenosis and possible TAVR. He is referred by Dr. Bronson Ing. He was recently seen by Dr. Bronson Ing in our [facility] office. Echo from an outside facility in December 2020 showed normal LV systolic function with [id number] with mild aortic insufficiency and severe aortic stenosis. At his most recent office visit with Dr. Bronson Ing, he denied any dyspnea, chest pain or dizziness. Repeat echo was planned but has not yet been done.   He tells me today that he has been feeling well. He has no dyspnea, chest pain, dizziness, LE edema. He has full dentures. He lives in [place] with his wife. 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   Primary Care Physician: Monico Blitz, MD Primary Cardiologist: Bronson Ing Referring Cardiologist: Bronson Ing  Past Medical History:  Diagnosis Date  . BCC (basal cell carcinoma of skin) 08/13/2010   V of neck- (tx p bx)  . BCC (basal cell carcinoma of skin) 12/11/2014   v of neck (CX35FU)  . BCC (basal cell carcinoma of skin) 04/07/1989   left cheek  . Heart murmur    Per patient, echocardiogram done was told nothing to be concerned about  . History of kidney stones   . Left hip pain   . Lumbago   . SCC (squamous cell carcinoma) 07/20/2011   left forearm (CX35FU)  . SCC (squamous cell carcinoma) 10/05/2011   Left ear (CX35FU)  . SCC (squamous cell carcinoma) 08/22/2018   left jawline (CX35FU)  . Superficial basal cell carcinoma (BCC) 07/20/2011   tip of nose (CX35FU)    Past Surgical History:  Procedure Laterality Date  . BACK SURGERY  2015  . TOTAL HIP  ARTHROPLASTY Left 05/29/2019   Procedure: TOTAL HIP ARTHROPLASTY ANTERIOR APPROACH;  Surgeon: Paralee Cancel, MD;  Location: [facility];  Service: Orthopedics;  Laterality: Left;  70 mins    Current Outpatient Medications  Medication Sig Dispense Refill  . acetaminophen (TYLENOL) 500 MG tablet Take 2 tablets (1,000 mg total) by mouth every 8 (eight) hours. 30 tablet 0  . Sennosides (EX-LAX) 15 MG TABS Take 1 tablet by mouth daily as needed.     No current facility-administered medications for this visit.    Allergies  Allergen Reactions  . Penicillins     Did it involve swelling of the face/tongue/throat, SOB, or low BP? No Did it involve sudden or severe rash/hives, skin peeling, or any reaction on the inside of your mouth or nose? Yes Did you need to seek medical attention at a hospital or doctor's office? No When did it last happen?childhood If all above answers are "NO", may proceed with cephalosporin use.  . Ibuprofen Rash    Social History   Socioeconomic History  . Marital status: Married    Spouse name: Not on file  . Number of children: 3  . Years of education: Not on file  . Highest education level: Not on file  Occupational History  . Occupation: Retired Armed forces logistics/support/administrative officer.  Tobacco Use  . Smoking status: Former Smoker    Quit date: 1957    Years since quitting: 64.2  .  Smokeless tobacco: Never Used  Substance and Sexual Activity  . Alcohol use: Yes    Comment: occasional wine with a meal  . Drug use: Never  . Sexual activity: Not on file  Other Topics Concern  . Not on file  Social History Narrative   3 grown children.       Patient has limited literacy.    Social Determinants of Health   Financial Resource Strain:   . Difficulty of Paying Living Expenses: Not on file  Food Insecurity:   . Worried About Charity fundraiser in the Last Year: Not on file  . Ran Out of Food in the Last Year: Not on file  Transportation Needs:   . Lack of Transportation  (Medical): Not on file  . Lack of Transportation (Non-Medical): Not on file  Physical Activity:   . Days of Exercise per Week: Not on file  . Minutes of Exercise per Session: Not on file  Stress:   . Feeling of Stress : Not on file  Social Connections:   . Frequency of Communication with Friends and Family: Not on file  . Frequency of Social Gatherings with Friends and Family: Not on file  . Attends Religious Services: Not on file  . Active Member of Clubs or Organizations: Not on file  . Attends Archivist Meetings: Not on file  . Marital Status: Not on file  Intimate Partner Violence:   . Fear of Current or Ex-Partner: Not on file  . Emotionally Abused: Not on file  . Physically Abused: Not on file  . Sexually Abused: Not on file    Family History  Problem Relation Age of Onset  . Heart disease Mother        enlarged heart  . Diabetes Mother   . Heart attack Father   . Cancer Sister   . Cancer Brother   . Colon cancer Neg Hx     Review of Systems:  As stated in the HPI and otherwise negative.   BP (!) 156/80   Pulse 65   Ht 5\' 7"  (1.702 m)   Wt 176 lb (79.8 kg)   SpO2 95%   BMI 27.57 kg/m   Physical Examination: General: Well developed, well nourished, NAD  HEENT: OP clear, mucus membranes moist  SKIN: warm, dry. No rashes. Neuro: No focal deficits  Musculoskeletal: Muscle strength 5/5 all ext  Psychiatric: Mood and affect normal  Neck: No JVD, no carotid bruits, no thyromegaly, no lymphadenopathy.  Lungs:Clear bilaterally, no wheezes, rhonci, crackles Cardiovascular: Regular rate and rhythm. Loud, harsh, late peaking systolic murmur.  Abdomen:Soft. Bowel sounds present. Non-tender.  Extremities: No lower extremity edema. Pulses are 2 + in the bilateral DP/PT.  EKG:  EKG is ordered today. The ekg ordered today demonstrates NSR, LBBB  Recent Labs: 05/30/2019: BUN 18; Creatinine, Ser 0.78; Hemoglobin 12.1; Platelets 145; Potassium 4.3; Sodium 136    Lipid Panel No results found for: CHOL, TRIG, HDL, CHOLHDL, VLDL, LDLCALC, LDLDIRECT   Wt Readings from Last 3 Encounters:  01/07/20 176 lb (79.8 kg)  11/21/19 174 lb 12.8 oz (79.3 kg)  05/29/19 173 lb 9.6 oz (78.7 kg)     Other studies Reviewed: Additional studies/ records that were reviewed today include:  Review of the above records demonstrates:   Assessment and Plan:   1. Severe Aortic Valve Stenosis: He severe aortic stenosis but he remains asymptomatic. I cannot view the images from the outside echo. I think he would benefit  from AVR. Given advanced age, he is not a good candidate for conventional AVR by surgical approach. I think he may be a good candidate for TAVR. I have reviewed the natural history of aortic stenosis with the patient and their family members  who are present today. We have discussed the limitations of medical therapy and the poor prognosis associated with symptomatic aortic stenosis. We have reviewed potential treatment options, including palliative medical therapy, conventional surgical aortic valve replacement, and transcatheter aortic valve replacement. We discussed treatment options in the context of the patient's specific comorbid medical conditions.   At this time, will continue to follow his AS as he is asymptomatic. Will plan echo in 3 months and I will see him after that. He will call with any symptoms before then.   Current medicines are reviewed at length with the patient today.  The patient does not have concerns regarding medicines.  The following changes have been made:  no change  Labs/ tests ordered today include:  No orders of the defined types were placed in this encounter.  Disposition:   FU with me in 3 months.   Signed, Lauree Chandler, MD 01/07/2020 1:38 PM    [facility] [place], [place], [place]  [address] Phone: [phone]; Fax: [phone]

## 2020-02-22 DIAGNOSIS — R202 Paresthesia of skin: Secondary | ICD-10-CM | POA: Diagnosis not present

## 2020-02-22 DIAGNOSIS — D692 Other nonthrombocytopenic purpura: Secondary | ICD-10-CM | POA: Diagnosis not present

## 2020-02-22 DIAGNOSIS — Z299 Encounter for prophylactic measures, unspecified: Secondary | ICD-10-CM | POA: Diagnosis not present

## 2020-02-22 DIAGNOSIS — I10 Essential (primary) hypertension: Secondary | ICD-10-CM | POA: Diagnosis not present

## 2020-03-18 ENCOUNTER — Telehealth: Payer: Self-pay

## 2020-03-18 ENCOUNTER — Other Ambulatory Visit: Payer: Self-pay | Admitting: Physician Assistant

## 2020-03-18 DIAGNOSIS — I350 Nonrheumatic aortic (valve) stenosis: Secondary | ICD-10-CM

## 2020-03-18 NOTE — Progress Notes (Signed)
Corey Aguilar                                       Cardiology Office Note    Date:  03/19/2020   ID:  Doristine Johns, DOB 07/19/1934, MRN [id number]  PCP:  Monico Blitz, MD  Cardiologist:  Dr. Bronson Ing  CC: follow up severe AS- new symptoms, start TAVR work up.   History of Present Illness:  Corey Aguilar is a 86 y.o. male with a history of skin cancer, nephrolithiasis, LBBB, and severe aortic stenosis who presents to clinic for evaluation of new symptoms of dizziness, shortness of breath and orthopnea.   He had an echo done at an outside facility in 10/2019 showed normal LV systolic function with [id number] with mild aortic insufficiency and severe aortic stenosis. He was referred to Dr. Angelena Form with the structural heart clinic in 01/2020. He was felt to have severe stage C aortic stenosis with no symptoms and 6 month follow up was recommended.   His wife called into our office on 03/18/20 with report of the pt feeling dizzy, short of breath and having orthopnea. He was added onto my clinic schedule with an echo.  Today he presents for evaluation. He was in his usual state of health until this past week. He noticed worsening LE edema (L>R), orthopnea and PND. Also feels swimmy headed and like "he can't think."  No syncope. No blood in his stool or urine.    Past Medical History:  Diagnosis Date  . BCC (basal cell carcinoma of skin) 08/13/2010   V of neck- (tx p bx)  . BCC (basal cell carcinoma of skin) 12/11/2014   v of neck (CX35FU)  . BCC (basal cell carcinoma of skin) 04/07/1989   left cheek  . Heart murmur    Per patient, echocardiogram done was told nothing to be concerned about  . History of kidney stones   . Left hip pain   . Lumbago   . SCC (squamous cell carcinoma) 07/20/2011   left forearm (CX35FU)  . SCC (squamous cell carcinoma) 10/05/2011   Left ear (CX35FU)  . SCC (squamous cell carcinoma) 08/22/2018   left jawline (CX35FU)  . Superficial basal cell carcinoma (BCC) 07/20/2011   tip of nose (CX35FU)    Past Surgical History:  Procedure Laterality Date  . BACK SURGERY  2015  . TOTAL HIP ARTHROPLASTY Left 05/29/2019   Procedure: TOTAL HIP ARTHROPLASTY ANTERIOR APPROACH;  Surgeon: Paralee Cancel, MD;  Location: [facility];  Service: Orthopedics;  Laterality: Left;  70 mins    Current Medications: Outpatient Medications Prior to Visit  Medication Sig Dispense Refill  . acetaminophen (TYLENOL) 500 MG tablet Take 2 tablets (1,000 mg total) by mouth every 8 (eight) hours. 30 tablet 0  . Sennosides (EX-LAX) 15 MG TABS Take 1 tablet by mouth daily as needed.     No facility-administered medications prior to visit.     Allergies:   Penicillins and Ibuprofen   Social History   Socioeconomic History  . Marital status: Married    Spouse name: Not on file  . Number of children: 3  . Years of education: Not on file  . Highest education level: Not on file  Occupational History  . Occupation: Retired Armed forces logistics/support/administrative officer.  Tobacco Use  . Smoking status: Former Smoker    Quit date: 1957  Years since quitting: 64.4  . Smokeless tobacco: Never Used  Substance and Sexual Activity  . Alcohol use: Yes    Comment: occasional wine with a meal  . Drug use: Never  . Sexual activity: Not on file  Other Topics Concern  . Not on file  Social History Narrative   3 grown children.       Patient has limited literacy.    Social Determinants of Health   Financial Resource Strain:   . Difficulty of Paying Living Expenses:   Food Insecurity:   . Worried About Charity fundraiser in the Last Year:   . Arboriculturist in the Last Year:   Transportation Needs:   . Film/video editor (Medical):   [name] Lack of Transportation (Non-Medical):   Physical Activity:   . Days of Exercise per Week:   . Minutes of Exercise per Session:   Stress:   . Feeling of Stress :   Social Connections:   . Frequency of  Communication with Friends and Family:   . Frequency of Social Gatherings with Friends and Family:   . Attends Religious Services:   . Active Member of Clubs or Organizations:   . Attends Archivist Meetings:   [name] Marital Status:      Family History:  The patient's family history includes Cancer in his brother and sister; Diabetes in his mother; Heart attack in his father; Heart disease in his mother.     ROS:   Please see the history of present illness.    ROS All other systems reviewed and are negative.   PHYSICAL EXAM:   VS:  BP 140/80   Pulse 76   Ht 5\' 7"  (1.702 m)   Wt 178 lb (80.7 kg)   SpO2 97%   BMI 27.88 kg/m    GEN: Well nourished, well developed, in no acute distress HEENT: normal Neck: no JVD or masses Cardiac: RRR; 4/6 harsh SEM. No rubs, or gallops. 1+ RLE and 2+ pitting LL edema  Respiratory:  clear to auscultation bilaterally, normal work of breathing GI: soft, nontender, nondistended, + BS MS: no deformity or atrophy Skin: warm and dry, no rash Neuro:  Alert and Oriented x 3, Strength and sensation are intact Psych: euthymic mood, full affect   Wt Readings from Last 3 Encounters:  03/19/20 178 lb (80.7 kg)  01/07/20 176 lb (79.8 kg)  11/21/19 174 lb 12.8 oz (79.3 kg)      Studies/Labs Reviewed:   EKG:  EKG is NOT ordered today.    Recent Labs: 05/30/2019: BUN 18; Creatinine, Ser 0.78; Hemoglobin 12.1; Platelets 145; Potassium 4.3; Sodium 136   Lipid Panel No results found for: CHOL, TRIG, HDL, CHOLHDL, VLDL, LDLCALC, LDLDIRECT  Additional studies/ records that were reviewed today include:   Echo 03/19/20 IMPRESSIONS    1. Left ventricular ejection fraction, by estimation, is 50 to 55%. The left ventricle has low normal function. The left ventricle demonstrates regional wall motion abnormalities (see scoring diagram/findings for description). There is mild left  ventricular hypertrophy. Left ventricular diastolic parameters are  consistent with Grade I diastolic dysfunction (impaired relaxation). There is moderate hypokinesis of the left ventricular, basal-mid inferior wall.  2. Right ventricular systolic function is normal. The right ventricular size is normal. There is normal pulmonary artery systolic pressure.  3. Left atrial size was mildly dilated.  4. The mitral valve is abnormal. Trivial mitral valve regurgitation. Mild mitral stenosis. The mean mitral valve gradient  is 4.0 mmHg with average heart rate of 66 bpm.  5. The aortic valve has an indeterminant number of cusps. Aortic valve regurgitation is mild. Severe aortic valve stenosis. Aortic valve area, by VTI measures 0.73 cm. Aortic valve mean gradient measures 44.5 mmHg. Aortic valve Vmax measures 4.22 m/s.  6. The inferior vena cava is normal in size with greater than 50% respiratory variability, suggesting right atrial pressure of 3 mmHg.  Comparison(s): Report only 10/29/19 EF 60-65%. Severe AS 60 mmHg peak PG, 42 mmHg mean PG. The decline in gradient may reflect decreased cardiac output.  FINDINGS  Left Ventricle: Left ventricular ejection fraction, by estimation, is 50 to 55%. The left ventricle has low normal function. The left ventricle demonstrates regional wall motion abnormalities. Moderate hypokinesis of the left ventricular, basal-mid  inferior wall. The left ventricular internal cavity size was normal in size. There is mild left ventricular hypertrophy. Left ventricular diastolic parameters are consistent with Grade I diastolic dysfunction (impaired relaxation). Indeterminate filling  pressures.  Right Ventricle: The right ventricular size is normal. No increase in right ventricular wall thickness. Right ventricular systolic function is normal. There is normal pulmonary artery systolic pressure. The tricuspid regurgitant velocity is 2.62 m/s, and  with an assumed right atrial pressure of 3 mmHg, the estimated right ventricular systolic pressure is  [id number] mmHg.  Left Atrium: Left atrial size was mildly dilated.  Right Atrium: Right atrial size was normal in size.  Pericardium: There is no evidence of pericardial effusion.  Mitral Valve: The mitral valve is abnormal. Mild to moderate mitral annular calcification. Trivial mitral valve regurgitation. Mild mitral valve stenosis. MV peak gradient, 11.7 mmHg. The mean mitral valve gradient is 4.0 mmHg with average heart rate of  66 bpm.  Tricuspid Valve: The tricuspid valve is grossly normal. Tricuspid valve regurgitation is mild.  Aortic Valve: The aortic valve has an indeterminant number of cusps. Aortic valve regurgitation is mild. Aortic regurgitation PHT measures 389 msec. Severe aortic stenosis is present. There is severe calcifcation of the aortic valve. Aortic valve mean  gradient measures 44.5 mmHg. Aortic valve peak gradient measures 71.1 mmHg. Aortic valve area, by VTI measures 0.73 cm.  Pulmonic Valve: The pulmonic valve was normal in structure. Pulmonic valve regurgitation is not visualized.  Aorta: The aortic root and ascending aorta are structurally normal, with no evidence of dilitation.  Venous: The inferior vena cava is normal in size with greater than 50% respiratory variability, suggesting right atrial pressure of 3 mmHg.  IAS/Shunts: No atrial level shunt detected by color flow Doppler.    LEFT VENTRICLE PLAX 2D LVIDd:         5.10 cm  Diastology LVIDs:         3.50 cm  LV e' lateral:   7.15 cm/s LV PW:         1.10 cm  LV E/e' lateral: 12.4 LV IVS:        1.40 cm  LV e' medial:    3.16 cm/s LVOT diam:     2.00 cm  LV E/e' medial:  28.0 LV SV:         86 LV SV Index:   45       2D Longitudinal Strain LVOT Area:     3.14 cm 2D Strain GLS (A2C):   -19.3 %                         2D Strain GLS (A3C):   -  17.2 %                         2D Strain GLS (A4C):   -16.6 %                         2D Strain GLS Avg:     -17.7 %                           3D  Volume EF:                         3D EF:        46 %                         LV EDV:       201 ml                         LV ESV:       110 ml                         LV SV:        92 ml  RIGHT VENTRICLE RV Basal diam:  4.00 cm RV S prime:     11.50 cm/s TAPSE (M-mode): 2.5 cm RVSP:           30.5 mmHg  LEFT ATRIUM             Index       RIGHT ATRIUM           Index LA diam:        4.40 cm 2.30 cm/m  RA Pressure: 3.00 mmHg LA Vol (A2C):   69.5 ml 36.29 ml/m RA Area:     11.00 cm LA Vol (A4C):   63.4 ml 33.10 ml/m RA Volume:   18.20 ml  9.50 ml/m LA Biplane Vol: 73.2 ml 38.22 ml/m  AORTIC VALVE AV Area (Vmax):    0.80 cm AV Area (Vmean):   0.75 cm AV Area (VTI):     0.73 cm AV Vmax:           421.50 cm/s AV Vmean:          305.200 cm/s AV VTI:            1.170 m AV Peak Grad:      71.1 mmHg AV Mean Grad:      44.5 mmHg LVOT Vmax:         107.00 cm/s LVOT Vmean:        72.700 cm/s LVOT VTI:          0.273 m LVOT/AV VTI ratio: 0.23 AI PHT:            389 msec   AORTA Ao Root diam: 3.70 cm Ao Asc diam:  3.60 cm  MITRAL VALVE                TRICUSPID VALVE MV Area (PHT): 4.68 cm     TR Peak grad:   27.5 mmHg MV Peak grad:  11.7 mmHg    TR Vmax:        262.00 cm/s MV Mean grad:  4.0 mmHg     Estimated RAP:  3.00 mmHg MV Vmax:  1.71 m/s     RVSP:           30.5 mmHg MV Vmean:      90.3 cm/s MV Decel Time: 162 msec     SHUNTS MV E velocity: 88.60 cm/s   Systemic VTI:  0.27 m MV A velocity: 138.00 cm/s  Systemic Diam: 2.00 cm MV E/A ratio:  0.64  ___________________   STS score Procedure: Isolated AVR Risk of Mortality:2.131% Renal Failure:1.557% Permanent Stroke:1.392% Prolonged Ventilation:6.792% DSW Infection:0.116% Reoperation:4.277% Morbidity or Mortality:11.859% Short Length of Stay:35.297% Long Length of Stay:5.949%   ASSESSMENT & PLAN:   Severe aortic stenosis: he now has stage D severe AS with NYHA class II symptoms. He he has new  onset heart failure with worsening shortness of breath, orthopnea and LE edema. Echo today showed slight worsening of EF to 50-55%, with regional wall motion abnormalities, mild LVH, mild MS and severe AS with a mean gradient of 44.5 mm hg, AVA 0.72 cm2, and DVI 0.23. Will start TAVR work up starting with a L/RHC. Will check pre cath labs today.  I have reviewed the risks, indications, and alternatives to cardiac catheterization and possible angioplasty/stenting with the patient. Risks include but are not limited to bleeding, infection, vascular injury, stroke, myocardial infection, arrhythmia, kidney injury, radiation-related injury in the case of prolonged fluoroscopy use, emergency cardiac surgery, and death. The patient understands the risks of serious complication is low (123456).   Acute on chronic combined S/D CHF: EF low normal. Evidence of mild volume overload on exam. Will start lasix 20 mg daily. BMET today.     Medication Adjustments/Labs and Tests Ordered: Current medicines are reviewed at length with the patient today.  Concerns regarding medicines are outlined above.  Medication changes, Labs and Tests ordered today are listed in the Patient Instructions below. Patient Instructions  Medication Instructions:  1) START LASIX 20 mg daily *If you need a refill on your cardiac medications before your next appointment, please call your pharmacy*  Lab Work: TODAY!  Testing/Procedures: Your physician has requested that you have a cardiac catheterization. Cardiac catheterization is used to diagnose and/or treat various heart conditions. Doctors may recommend this procedure for a number of different reasons. The most common reason is to evaluate chest pain. Chest pain can be a symptom of coronary artery disease (CAD), and cardiac catheterization can show whether plaque is narrowing or blocking your heart's arteries. This procedure is also used to evaluate the valves, as well as measure the blood  flow and oxygen levels in different parts of your heart. For further information please visit [email or web address]. Please follow instruction sheet, as given.    COVID SCREENING INFORMATION: You are scheduled for your drive-thru COVID screening on: [employer] (old [facility]) [address] Stay in the RIGHT lane and proceed under the brick awning and tell them you are there for pre-procedure testing. Do NOT bring any pets with you to the testing site. You will need to go home after your screening and quarantine until your procedure.   CATHETERIZATION INSTRUCTIONS: You are scheduled for a Cardiac Catheterization on:  1. Please arrive at the [facility] (Main Entrance A) at [facility]: [address] [place], [place] [address] at: (This time is two hours before your procedure to ensure your preparation). Free valet parking service is available. You are allowed ONE visitor in the waiting room during your procedure. Both you and your guest must wear masks. Special note: Every effort  is made to have your procedure done on time. Please understand that emergencies sometimes delay scheduled procedures.  2. Diet: Do not eat solid foods after midnight.  You may have clear liquids until 5am upon the day of the procedure.  3. Labs: TODAY! BMET, CBC  4. Medication instructions in preparation for your procedure:  1) HOLD LASIX the morning of your procedure   2) TAKE ASPIRIN 81 mg the morning of your catheterization  5. Plan for one night stay--bring personal belongings. 6. Bring a current list of your medications and current insurance cards. 7. You MUST have a responsible person to drive you home. 8. Someone MUST be with you the first 24 hours after you arrive home or your discharge will be delayed. 9. Please wear clothes that are easy to get on and off and wear slip-on shoes.  Thank you for allowing [place] to care for you!   -- [facility] Invasive Cardiovascular  services     Signed, Angelena Form, PA-C  03/19/2020 4:03 PM    [address], [place], [place]  [address] Phone: [phone]; Fax: [phone]

## 2020-03-18 NOTE — Telephone Encounter (Signed)
Thanks

## 2020-03-18 NOTE — Telephone Encounter (Signed)
  [facility]   MULTIDISCIPLINARY HEART VALVE TEAM  The pt's wife contacted the office due to concerns about symptoms that the pt is experiencing.  Over the past 3 days the pt c/o feeling "swimmy headed", the pt is dizzy and feels like he could pass out. She states the pt may have also had chest discomfort but he has a lot of issues with pain all over his body and he cannot be sure. The pt also has worsening SOB when lying down.  The pt was evaluated by Dr Angelena Form 01/07/20 due to severe AS but the pt was asymptomatic at that time.  I have arranged evaluation with Nell Range PA-C on 5/12 for further evaluation.  If the pt has develops any new or worsening symptoms then he will proceed to the ER.  Pt's wife agreed with plan.

## 2020-03-18 NOTE — H&P (View-Only) (Signed)
[facility]                                       Cardiology Office Note    Date:  03/19/2020   ID:  Corey Aguilar, DOB 04-Aug-1934, MRN [id number]  PCP:  Monico Blitz, MD  Cardiologist:  Dr. Bronson Ing  CC: follow up severe AS- new symptoms, start TAVR work up.   History of Present Illness:  Corey Aguilar is a 86 y.o. male with a history of skin cancer, nephrolithiasis, LBBB, and severe aortic stenosis who presents to clinic for evaluation of new symptoms of dizziness, shortness of breath and orthopnea.   He had an echo done at an outside facility in 10/2019 showed normal LV systolic function with [id number] with mild aortic insufficiency and severe aortic stenosis. He was referred to Dr. Angelena Form with the structural heart clinic in 01/2020. He was felt to have severe stage C aortic stenosis with no symptoms and 6 month follow up was recommended.   His wife called into our office on 03/18/20 with report of the pt feeling dizzy, short of breath and having orthopnea. He was added onto my clinic schedule with an echo.  Today he presents for evaluation. He was in his usual state of health until this past week. He noticed worsening LE edema (L>R), orthopnea and PND. Also feels swimmy headed and like "he can't think."  No syncope. No blood in his stool or urine.    Past Medical History:  Diagnosis Date  . BCC (basal cell carcinoma of skin) 08/13/2010   V of neck- (tx p bx)  . BCC (basal cell carcinoma of skin) 12/11/2014   v of neck (CX35FU)  . BCC (basal cell carcinoma of skin) 04/07/1989   left cheek  . Heart murmur    Per patient, echocardiogram done was told nothing to be concerned about  . History of kidney stones   . Left hip pain   . Lumbago   . SCC (squamous cell carcinoma) 07/20/2011   left forearm (CX35FU)  . SCC (squamous cell carcinoma) 10/05/2011   Left ear (CX35FU)  . SCC (squamous cell carcinoma) 08/22/2018   left jawline (CX35FU)  . Superficial basal cell carcinoma (BCC) 07/20/2011   tip of nose (CX35FU)    Past Surgical History:  Procedure Laterality Date  . BACK SURGERY  2015  . TOTAL HIP ARTHROPLASTY Left 05/29/2019   Procedure: TOTAL HIP ARTHROPLASTY ANTERIOR APPROACH;  Surgeon: Paralee Cancel, MD;  Location: [facility];  Service: Orthopedics;  Laterality: Left;  70 mins    Current Medications: Outpatient Medications Prior to Visit  Medication Sig Dispense Refill  . acetaminophen (TYLENOL) 500 MG tablet Take 2 tablets (1,000 mg total) by mouth every 8 (eight) hours. 30 tablet 0  . Sennosides (EX-LAX) 15 MG TABS Take 1 tablet by mouth daily as needed.     No facility-administered medications prior to visit.     Allergies:   Penicillins and Ibuprofen   Social History   Socioeconomic History  . Marital status: Married    Spouse name: Not on file  . Number of children: 3  . Years of education: Not on file  . Highest education level: Not on file  Occupational History  . Occupation: Retired Armed forces logistics/support/administrative officer.  Tobacco Use  . Smoking status: Former Smoker    Quit date: 1957  Years since quitting: 64.4  . Smokeless tobacco: Never Used  Substance and Sexual Activity  . Alcohol use: Yes    Comment: occasional wine with a meal  . Drug use: Never  . Sexual activity: Not on file  Other Topics Concern  . Not on file  Social History Narrative   3 grown children.       Patient has limited literacy.    Social Determinants of Health   Financial Resource Strain:   . Difficulty of Paying Living Expenses:   Food Insecurity:   . Worried About Charity fundraiser in the Last Year:   . Arboriculturist in the Last Year:   Transportation Needs:   . Film/video editor (Medical):   [name] Lack of Transportation (Non-Medical):   Physical Activity:   . Days of Exercise per Week:   . Minutes of Exercise per Session:   Stress:   . Feeling of Stress :   Social Connections:   . Frequency of  Communication with Friends and Family:   . Frequency of Social Gatherings with Friends and Family:   . Attends Religious Services:   . Active Member of Clubs or Organizations:   . Attends Archivist Meetings:   [name] Marital Status:      Family History:  The patient's family history includes Cancer in his brother and sister; Diabetes in his mother; Heart attack in his father; Heart disease in his mother.     ROS:   Please see the history of present illness.    ROS All other systems reviewed and are negative.   PHYSICAL EXAM:   VS:  BP 140/80   Pulse 76   Ht 5\' 7"  (1.702 m)   Wt 178 lb (80.7 kg)   SpO2 97%   BMI 27.88 kg/m    GEN: Well nourished, well developed, in no acute distress HEENT: normal Neck: no JVD or masses Cardiac: RRR; 4/6 harsh SEM. No rubs, or gallops. 1+ RLE and 2+ pitting LL edema  Respiratory:  clear to auscultation bilaterally, normal work of breathing GI: soft, nontender, nondistended, + BS MS: no deformity or atrophy Skin: warm and dry, no rash Neuro:  Alert and Oriented x 3, Strength and sensation are intact Psych: euthymic mood, full affect   Wt Readings from Last 3 Encounters:  03/19/20 178 lb (80.7 kg)  01/07/20 176 lb (79.8 kg)  11/21/19 174 lb 12.8 oz (79.3 kg)      Studies/Labs Reviewed:   EKG:  EKG is NOT ordered today.    Recent Labs: 05/30/2019: BUN 18; Creatinine, Ser 0.78; Hemoglobin 12.1; Platelets 145; Potassium 4.3; Sodium 136   Lipid Panel No results found for: CHOL, TRIG, HDL, CHOLHDL, VLDL, LDLCALC, LDLDIRECT  Additional studies/ records that were reviewed today include:   Echo 03/19/20 IMPRESSIONS    1. Left ventricular ejection fraction, by estimation, is 50 to 55%. The left ventricle has low normal function. The left ventricle demonstrates regional wall motion abnormalities (see scoring diagram/findings for description). There is mild left  ventricular hypertrophy. Left ventricular diastolic parameters are  consistent with Grade I diastolic dysfunction (impaired relaxation). There is moderate hypokinesis of the left ventricular, basal-mid inferior wall.  2. Right ventricular systolic function is normal. The right ventricular size is normal. There is normal pulmonary artery systolic pressure.  3. Left atrial size was mildly dilated.  4. The mitral valve is abnormal. Trivial mitral valve regurgitation. Mild mitral stenosis. The mean mitral valve gradient  is 4.0 mmHg with average heart rate of 66 bpm.  5. The aortic valve has an indeterminant number of cusps. Aortic valve regurgitation is mild. Severe aortic valve stenosis. Aortic valve area, by VTI measures 0.73 cm. Aortic valve mean gradient measures 44.5 mmHg. Aortic valve Vmax measures 4.22 m/s.  6. The inferior vena cava is normal in size with greater than 50% respiratory variability, suggesting right atrial pressure of 3 mmHg.  Comparison(s): Report only 10/29/19 EF 60-65%. Severe AS 60 mmHg peak PG, 42 mmHg mean PG. The decline in gradient may reflect decreased cardiac output.  FINDINGS  Left Ventricle: Left ventricular ejection fraction, by estimation, is 50 to 55%. The left ventricle has low normal function. The left ventricle demonstrates regional wall motion abnormalities. Moderate hypokinesis of the left ventricular, basal-mid  inferior wall. The left ventricular internal cavity size was normal in size. There is mild left ventricular hypertrophy. Left ventricular diastolic parameters are consistent with Grade I diastolic dysfunction (impaired relaxation). Indeterminate filling  pressures.  Right Ventricle: The right ventricular size is normal. No increase in right ventricular wall thickness. Right ventricular systolic function is normal. There is normal pulmonary artery systolic pressure. The tricuspid regurgitant velocity is 2.62 m/s, and  with an assumed right atrial pressure of 3 mmHg, the estimated right ventricular systolic pressure is  [id number] mmHg.  Left Atrium: Left atrial size was mildly dilated.  Right Atrium: Right atrial size was normal in size.  Pericardium: There is no evidence of pericardial effusion.  Mitral Valve: The mitral valve is abnormal. Mild to moderate mitral annular calcification. Trivial mitral valve regurgitation. Mild mitral valve stenosis. MV peak gradient, 11.7 mmHg. The mean mitral valve gradient is 4.0 mmHg with average heart rate of  66 bpm.  Tricuspid Valve: The tricuspid valve is grossly normal. Tricuspid valve regurgitation is mild.  Aortic Valve: The aortic valve has an indeterminant number of cusps. Aortic valve regurgitation is mild. Aortic regurgitation PHT measures 389 msec. Severe aortic stenosis is present. There is severe calcifcation of the aortic valve. Aortic valve mean  gradient measures 44.5 mmHg. Aortic valve peak gradient measures 71.1 mmHg. Aortic valve area, by VTI measures 0.73 cm.  Pulmonic Valve: The pulmonic valve was normal in structure. Pulmonic valve regurgitation is not visualized.  Aorta: The aortic root and ascending aorta are structurally normal, with no evidence of dilitation.  Venous: The inferior vena cava is normal in size with greater than 50% respiratory variability, suggesting right atrial pressure of 3 mmHg.  IAS/Shunts: No atrial level shunt detected by color flow Doppler.    LEFT VENTRICLE PLAX 2D LVIDd:         5.10 cm  Diastology LVIDs:         3.50 cm  LV e' lateral:   7.15 cm/s LV PW:         1.10 cm  LV E/e' lateral: 12.4 LV IVS:        1.40 cm  LV e' medial:    3.16 cm/s LVOT diam:     2.00 cm  LV E/e' medial:  28.0 LV SV:         86 LV SV Index:   45       2D Longitudinal Strain LVOT Area:     3.14 cm 2D Strain GLS (A2C):   -19.3 %                         2D Strain GLS (A3C):   -  17.2 %                         2D Strain GLS (A4C):   -16.6 %                         2D Strain GLS Avg:     -17.7 %                           3D  Volume EF:                         3D EF:        46 %                         LV EDV:       201 ml                         LV ESV:       110 ml                         LV SV:        92 ml  RIGHT VENTRICLE RV Basal diam:  4.00 cm RV S prime:     11.50 cm/s TAPSE (M-mode): 2.5 cm RVSP:           30.5 mmHg  LEFT ATRIUM             Index       RIGHT ATRIUM           Index LA diam:        4.40 cm 2.30 cm/m  RA Pressure: 3.00 mmHg LA Vol (A2C):   69.5 ml 36.29 ml/m RA Area:     11.00 cm LA Vol (A4C):   63.4 ml 33.10 ml/m RA Volume:   18.20 ml  9.50 ml/m LA Biplane Vol: 73.2 ml 38.22 ml/m  AORTIC VALVE AV Area (Vmax):    0.80 cm AV Area (Vmean):   0.75 cm AV Area (VTI):     0.73 cm AV Vmax:           421.50 cm/s AV Vmean:          305.200 cm/s AV VTI:            1.170 m AV Peak Grad:      71.1 mmHg AV Mean Grad:      44.5 mmHg LVOT Vmax:         107.00 cm/s LVOT Vmean:        72.700 cm/s LVOT VTI:          0.273 m LVOT/AV VTI ratio: 0.23 AI PHT:            389 msec   AORTA Ao Root diam: 3.70 cm Ao Asc diam:  3.60 cm  MITRAL VALVE                TRICUSPID VALVE MV Area (PHT): 4.68 cm     TR Peak grad:   27.5 mmHg MV Peak grad:  11.7 mmHg    TR Vmax:        262.00 cm/s MV Mean grad:  4.0 mmHg     Estimated RAP:  3.00 mmHg MV Vmax:  1.71 m/s     RVSP:           30.5 mmHg MV Vmean:      90.3 cm/s MV Decel Time: 162 msec     SHUNTS MV E velocity: 88.60 cm/s   Systemic VTI:  0.27 m MV A velocity: 138.00 cm/s  Systemic Diam: 2.00 cm MV E/A ratio:  0.64  ___________________   STS score Procedure: Isolated AVR Risk of Mortality:2.131% Renal Failure:1.557% Permanent Stroke:1.392% Prolonged Ventilation:6.792% DSW Infection:0.116% Reoperation:4.277% Morbidity or Mortality:11.859% Short Length of Stay:35.297% Long Length of Stay:5.949%   ASSESSMENT & PLAN:   Severe aortic stenosis: he now has stage D severe AS with NYHA class II symptoms. He he has new  onset heart failure with worsening shortness of breath, orthopnea and LE edema. Echo today showed slight worsening of EF to 50-55%, with regional wall motion abnormalities, mild LVH, mild MS and severe AS with a mean gradient of 44.5 mm hg, AVA 0.72 cm2, and DVI 0.23. Will start TAVR work up starting with a L/RHC. Will check pre cath labs today.  I have reviewed the risks, indications, and alternatives to cardiac catheterization and possible angioplasty/stenting with the patient. Risks include but are not limited to bleeding, infection, vascular injury, stroke, myocardial infection, arrhythmia, kidney injury, radiation-related injury in the case of prolonged fluoroscopy use, emergency cardiac surgery, and death. The patient understands the risks of serious complication is low (123456).   Acute on chronic combined S/D CHF: EF low normal. Evidence of mild volume overload on exam. Will start lasix 20 mg daily. BMET today.     Medication Adjustments/Labs and Tests Ordered: Current medicines are reviewed at length with the patient today.  Concerns regarding medicines are outlined above.  Medication changes, Labs and Tests ordered today are listed in the Patient Instructions below. Patient Instructions  Medication Instructions:  1) START LASIX 20 mg daily *If you need a refill on your cardiac medications before your next appointment, please call your pharmacy*  Lab Work: TODAY!  Testing/Procedures: Your physician has requested that you have a cardiac catheterization. Cardiac catheterization is used to diagnose and/or treat various heart conditions. Doctors may recommend this procedure for a number of different reasons. The most common reason is to evaluate chest pain. Chest pain can be a symptom of coronary artery disease (CAD), and cardiac catheterization can show whether plaque is narrowing or blocking your heart's arteries. This procedure is also used to evaluate the valves, as well as measure the blood  flow and oxygen levels in different parts of your heart. For further information please visit [facility]uge[facility]iesta[facility][email or web address]. Please follow instruction sheet, as given.    COVID SCREENING INFORMATION: You are scheduled for your drive-thru COVID screening on: [employer] (old [facility]) [address] Stay in the RIGHT lane and proceed under the brick awning and tell them you are there for pre-procedure testing. Do NOT bring any pets with you to the testing site. You will need to go home after your screening and quarantine until your procedure.   CATHETERIZATION INSTRUCTIONS: You are scheduled for a Cardiac Catheterization on:  1. Please arrive at the [facility] (Main Entrance A) at [facility]: [address] [place], [place] [address] at: (This time is two hours before your procedure to ensure your preparation). Free valet parking service is available. You are allowed ONE visitor in the waiting room during your procedure. Both you and your guest must wear masks. Special note: Every effort  is made to have your procedure done on time. Please understand that emergencies sometimes delay scheduled procedures.  2. Diet: Do not eat solid foods after midnight.  You may have clear liquids until 5am upon the day of the procedure.  3. Labs: TODAY! BMET, CBC  4. Medication instructions in preparation for your procedure:  1) HOLD LASIX the morning of your procedure   2) TAKE ASPIRIN 81 mg the morning of your catheterization  5. Plan for one night stay--bring personal belongings. 6. Bring a current list of your medications and current insurance cards. 7. You MUST have a responsible person to drive you home. 8. Someone MUST be with you the first 24 hours after you arrive home or your discharge will be delayed. 9. Please wear clothes that are easy to get on and off and wear slip-on shoes.  Thank you for allowing [place] to care for you!   -- [facility] Invasive Cardiovascular  services     Signed, Angelena Form, PA-C  03/19/2020 4:03 PM    [address], [place], [place]  [address] Phone: [phone]; Fax: [phone]

## 2020-03-19 ENCOUNTER — Encounter (INDEPENDENT_AMBULATORY_CARE_PROVIDER_SITE_OTHER): Payer: Self-pay

## 2020-03-19 ENCOUNTER — Encounter: Payer: Self-pay | Admitting: Physician Assistant

## 2020-03-19 ENCOUNTER — Ambulatory Visit: Payer: PPO | Admitting: Physician Assistant

## 2020-03-19 ENCOUNTER — Encounter: Payer: PPO | Admitting: Neurology

## 2020-03-19 ENCOUNTER — Ambulatory Visit (HOSPITAL_COMMUNITY): Payer: PPO | Attending: Internal Medicine

## 2020-03-19 ENCOUNTER — Other Ambulatory Visit: Payer: Self-pay

## 2020-03-19 VITALS — BP 140/80 | HR 76 | Ht 67.0 in | Wt 178.0 lb

## 2020-03-19 DIAGNOSIS — I350 Nonrheumatic aortic (valve) stenosis: Secondary | ICD-10-CM

## 2020-03-19 DIAGNOSIS — I5043 Acute on chronic combined systolic (congestive) and diastolic (congestive) heart failure: Secondary | ICD-10-CM | POA: Diagnosis not present

## 2020-03-19 MED ORDER — FUROSEMIDE 20 MG PO TABS: 20.0000 mg | ORAL_TABLET | Freq: Every day | ORAL | 3 refills | Status: DC

## 2020-03-19 NOTE — Patient Instructions (Addendum)
Medication Instructions:  1) START LASIX 20 mg daily *If you need a refill on your cardiac medications before your next appointment, please call your pharmacy*  Lab Work: TODAY!  Testing/Procedures: Your physician has requested that you have a cardiac catheterization. Cardiac catheterization is used to diagnose and/or treat various heart conditions. Doctors may recommend this procedure for a number of different reasons. The most common reason is to evaluate chest pain. Chest pain can be a symptom of coronary artery disease (CAD), and cardiac catheterization can show whether plaque is narrowing or blocking your heart's arteries. This procedure is also used to evaluate the valves, as well as measure the blood flow and oxygen levels in different parts of your heart. For further information please visit [facility]uge[facility]iesta[employer][email or web address]. Please follow instruction sheet, as given.    COVID SCREENING INFORMATION (5/18): You are scheduled for your drive-thru COVID screening on: 03/25/2020 at 12:45PM. [facility] (old [facility]) [address] Stay in the RIGHT lane and proceed under the brick awning and tell them you are there for pre-procedure testing. Do NOT bring any pets with you to the testing site. You will need to go home after your screening and quarantine until your procedure.   CATHETERIZATION INSTRUCTIONS (5/20): You are scheduled for a Cardiac Catheterization on: 03/27/2020 with Dr. Angelena Form  1. Please arrive at the [facility] (Main Entrance A) at [facility]: [address] [place], [place] [address] at: 11:30AM (This time is two hours before your procedure to ensure your preparation). Free valet parking service is available. You are allowed ONE visitor in the waiting room during your procedure. Both you and your guest must wear masks. Special note: Every effort is made to have your procedure done on time. Please understand that emergencies sometimes delay  scheduled procedures.  2. Diet: Do not eat solid foods after midnight.  You may have clear liquids until 5am upon the day of the procedure.  3. Labs: TODAY! BMET, CBC  4. Medication instructions in preparation for your procedure:  1) HOLD LASIX the morning of your procedure   2) TAKE ASPIRIN 81 mg the morning of your catheterization  5. Plan for one night stay--bring personal belongings. 6. Bring a current list of your medications and current insurance cards. 7. You MUST have a responsible person to drive you home. 8. Someone MUST be with you the first 24 hours after you arrive home or your discharge will be delayed. 9. Please wear clothes that are easy to get on and off and wear slip-on shoes.  Thank you for allowing [place] to care for you!   -- [place] In[facility] Cardi[facility] services

## 2020-03-20 LAB — CBC WITH DIFFERENTIAL/PLATELET
Basophils Absolute: 0.1 10*3/uL (ref 0.0–0.2)
Basos: 1 %
EOS (ABSOLUTE): 0.3 10*3/uL (ref 0.0–0.4)
Eos: 4 %
Hematocrit: 41 % (ref 37.5–51.0)
Hemoglobin: 13.6 g/dL (ref 13.0–17.7)
Immature Grans (Abs): 0 10*3/uL (ref 0.0–0.1)
Immature Granulocytes: 0 %
Lymphocytes Absolute: 1.7 10*3/uL (ref 0.7–3.1)
Lymphs: 24 %
MCH: 30.4 pg (ref 26.6–33.0)
MCHC: 33.2 g/dL (ref 31.5–35.7)
MCV: 92 fL (ref 79–97)
Monocytes Absolute: 0.8 10*3/uL (ref 0.1–0.9)
Monocytes: 12 %
Neutrophils Absolute: 4.1 10*3/uL (ref 1.4–7.0)
Neutrophils: 59 %
Platelets: 215 10*3/uL (ref 150–450)
RBC: 4.47 x10E6/uL (ref 4.14–5.80)
RDW: 12.8 % (ref 11.6–15.4)
WBC: 7 10*3/uL (ref 3.4–10.8)

## 2020-03-20 LAB — BASIC METABOLIC PANEL
BUN/Creatinine Ratio: 22 (ref 10–24)
BUN: 18 mg/dL (ref 8–27)
CO2: 25 mmol/L (ref 20–29)
Calcium: 9.3 mg/dL (ref 8.6–10.2)
Chloride: 102 mmol/L (ref 96–106)
Creatinine, Ser: 0.82 mg/dL (ref 0.76–1.27)
GFR calc Af Amer: 94 mL/min/{1.73_m2} (ref >59)
GFR calc non Af Amer: 81 mL/min/{1.73_m2} (ref >59)
Glucose: 94 mg/dL (ref 65–99)
Potassium: 4.3 mmol/L (ref 3.5–5.2)
Sodium: 140 mmol/L (ref 134–144)

## 2020-03-25 ENCOUNTER — Other Ambulatory Visit (HOSPITAL_COMMUNITY)
Admission: RE | Admit: 2020-03-25 | Discharge: 2020-03-25 | Disposition: A | Discharge status: Home / Self Care | Payer: PPO | Source: Ambulatory Visit | Attending: Cardiovascular Disease | Admitting: Cardiovascular Disease

## 2020-03-25 DIAGNOSIS — Z20822 Contact with and (suspected) exposure to covid-19: Secondary | ICD-10-CM | POA: Insufficient documentation

## 2020-03-25 DIAGNOSIS — Z01812 Encounter for preprocedural laboratory examination: Secondary | ICD-10-CM | POA: Diagnosis not present

## 2020-03-25 LAB — SARS CORONAVIRUS 2 (TAT 6-24 HRS): SARS Coronavirus 2: NEGATIVE

## 2020-03-26 ENCOUNTER — Other Ambulatory Visit: Payer: Self-pay

## 2020-03-26 ENCOUNTER — Telehealth: Payer: Self-pay | Admitting: *Deleted

## 2020-03-26 DIAGNOSIS — I350 Nonrheumatic aortic (valve) stenosis: Secondary | ICD-10-CM

## 2020-03-26 NOTE — Telephone Encounter (Addendum)
Pt contacted pre-catheterization scheduled at [facility] for: Thursday Mar 27, 2020 1:30 PM Verified arrival time and place: [facility]) at: 11:30 AM   No solid food after midnight prior to cath, clear liquids until 5 AM day of procedure. Contrast allergy: no  Hold: Lasix-AM of procedure  Except hold medications AM meds can be  taken pre-cath with sip of water including: ASA 81 mg   Confirmed patient has responsible adult to drive home post procedure and observe 24 hours after arriving home: yes  You are allowed ONE visitor in the waiting room during your procedure. Both you and your visitor must wear masks.      COVID-19 Pre-Screening Questions:  . In the past 7 to 10 days have you had a cough,  shortness of breath, headache, congestion, fever (100 or greater) body aches, chills, sore throat, or sudden loss of taste or sense of smell? no . Have you been around anyone with known Covid 19 in the past 7 to 10 days? no . Have you been around anyone who is awaiting Covid 19 test results in the past 7 to 10 days? no . Have you been around anyone who has mentioned symptoms of Covid 19 within the past 7 to 10 days? no   Reviewed procedure/mask/visitor instructions, COVID-19 screening questions with patient

## 2020-03-27 ENCOUNTER — Ambulatory Visit (HOSPITAL_COMMUNITY)
Admission: RE | Admit: 2020-03-27 | Discharge: 2020-03-27 | Disposition: A | Discharge status: Home / Self Care | Payer: PPO | Attending: Cardiovascular Disease | Admitting: Cardiovascular Disease

## 2020-03-27 ENCOUNTER — Encounter (HOSPITAL_COMMUNITY)
Admission: RE | Disposition: A | Discharge status: Home / Self Care | Payer: Self-pay | Source: Home / Self Care | Attending: Cardiovascular Disease

## 2020-03-27 ENCOUNTER — Other Ambulatory Visit: Payer: Self-pay

## 2020-03-27 DIAGNOSIS — Z87891 Personal history of nicotine dependence: Secondary | ICD-10-CM | POA: Insufficient documentation

## 2020-03-27 DIAGNOSIS — I350 Nonrheumatic aortic (valve) stenosis: Secondary | ICD-10-CM | POA: Diagnosis not present

## 2020-03-27 DIAGNOSIS — Z886 Allergy status to analgesic agent status: Secondary | ICD-10-CM | POA: Diagnosis not present

## 2020-03-27 DIAGNOSIS — Z880 Allergy status to penicillin: Secondary | ICD-10-CM | POA: Insufficient documentation

## 2020-03-27 DIAGNOSIS — I447 Left bundle-branch block, unspecified: Secondary | ICD-10-CM | POA: Diagnosis not present

## 2020-03-27 DIAGNOSIS — Z85828 Personal history of other malignant neoplasm of skin: Secondary | ICD-10-CM | POA: Insufficient documentation

## 2020-03-27 DIAGNOSIS — I352 Nonrheumatic aortic (valve) stenosis with insufficiency: Secondary | ICD-10-CM | POA: Insufficient documentation

## 2020-03-27 DIAGNOSIS — I2510 Atherosclerotic heart disease of native coronary artery without angina pectoris: Secondary | ICD-10-CM | POA: Insufficient documentation

## 2020-03-27 HISTORY — PX: RIGHT/LEFT HEART CATH AND CORONARY ANGIOGRAPHY: CATH118266

## 2020-03-27 HISTORY — PX: CARDIAC CATHETERIZATION: SHX172

## 2020-03-27 LAB — POCT I-STAT 7, (LYTES, BLD GAS, ICA,H+H)
Acid-Base Excess: 1 mmol/L (ref 0.0–2.0)
Bicarbonate: 27 mmol/L (ref 20.0–28.0)
Calcium, Ion: 1.26 mmol/L (ref 1.15–1.40)
HCT: 37 % — ABNORMAL LOW (ref 39.0–52.0)
Hemoglobin: 12.6 g/dL — ABNORMAL LOW (ref 13.0–17.0)
O2 Saturation: 98 %
Potassium: 4 mmol/L (ref 3.5–5.1)
Sodium: 141 mmol/L (ref 135–145)
TCO2: 28 mmol/L (ref 22–32)
pCO2 arterial: 45 mmHg (ref 32.0–48.0)
pH, Arterial: 7.385 (ref 7.350–7.450)
pO2, Arterial: 116 mmHg — ABNORMAL HIGH (ref 83.0–108.0)

## 2020-03-27 LAB — POCT I-STAT EG7
Acid-Base Excess: 2 mmol/L (ref 0.0–2.0)
Bicarbonate: 27.7 mmol/L (ref 20.0–28.0)
Calcium, Ion: 1.21 mmol/L (ref 1.15–1.40)
HCT: 37 % — ABNORMAL LOW (ref 39.0–52.0)
Hemoglobin: 12.6 g/dL — ABNORMAL LOW (ref 13.0–17.0)
O2 Saturation: 76 %
Potassium: 3.9 mmol/L (ref 3.5–5.1)
Sodium: 142 mmol/L (ref 135–145)
TCO2: 29 mmol/L (ref 22–32)
pCO2, Ven: 48.7 mmHg (ref 44.0–60.0)
pH, Ven: 7.363 (ref 7.250–7.430)
pO2, Ven: 43 mmHg (ref 32.0–45.0)

## 2020-03-27 SURGERY — RIGHT/LEFT HEART CATH AND CORONARY ANGIOGRAPHY
Anesthesia: LOCAL

## 2020-03-27 MED ORDER — SODIUM CHLORIDE 0.9% FLUSH: 3.0000 mL | INTRAVENOUS | Status: DC | PRN

## 2020-03-27 MED ORDER — VERAPAMIL HCL 2.5 MG/ML IV SOLN
INTRAVENOUS | Status: AC
  Filled 2020-03-27: qty 2

## 2020-03-27 MED ORDER — FENTANYL CITRATE (PF) 100 MCG/2ML IJ SOLN
INTRAMUSCULAR | Status: AC
  Filled 2020-03-27: qty 2

## 2020-03-27 MED ORDER — SODIUM CHLORIDE 0.9% FLUSH: 3.0000 mL | Freq: Two times a day (BID) | INTRAVENOUS | Status: DC

## 2020-03-27 MED ORDER — LIDOCAINE HCL (PF) 1 % IJ SOLN
INTRAMUSCULAR | Status: AC
  Filled 2020-03-27: qty 30

## 2020-03-27 MED ORDER — MIDAZOLAM HCL 2 MG/2ML IJ SOLN
INTRAMUSCULAR | Status: DC | PRN
  Administered 2020-03-27: 1 mg via INTRAVENOUS

## 2020-03-27 MED ORDER — HEPARIN SODIUM (PORCINE) 1000 UNIT/ML IJ SOLN
INTRAMUSCULAR | Status: DC | PRN
  Administered 2020-03-27: 4000 [IU] via INTRAVENOUS

## 2020-03-27 MED ORDER — SODIUM CHLORIDE 0.9 % WEIGHT BASED INFUSION
3.0000 mL/kg/h | INTRAVENOUS | Status: AC
  Administered 2020-03-27: 3 mL/kg/h via INTRAVENOUS

## 2020-03-27 MED ORDER — ONDANSETRON HCL 4 MG/2ML IJ SOLN: 4.0000 mg | Freq: Four times a day (QID) | INTRAMUSCULAR | Status: DC | PRN

## 2020-03-27 MED ORDER — HEPARIN SODIUM (PORCINE) 1000 UNIT/ML IJ SOLN
INTRAMUSCULAR | Status: AC
  Filled 2020-03-27: qty 1

## 2020-03-27 MED ORDER — LIDOCAINE HCL (PF) 1 % IJ SOLN
INTRAMUSCULAR | Status: DC | PRN
  Administered 2020-03-27: 2 mL via INTRADERMAL
  Administered 2020-03-27: 10 mL via INTRADERMAL
  Administered 2020-03-27: 2 mL via INTRADERMAL

## 2020-03-27 MED ORDER — HEPARIN (PORCINE) IN NACL 1000-0.9 UT/500ML-% IV SOLN
INTRAVENOUS | Status: DC | PRN
  Administered 2020-03-27 (×4): 500 mL

## 2020-03-27 MED ORDER — MIDAZOLAM HCL 2 MG/2ML IJ SOLN
INTRAMUSCULAR | Status: AC
  Filled 2020-03-27: qty 2

## 2020-03-27 MED ORDER — VERAPAMIL HCL 2.5 MG/ML IV SOLN
INTRAVENOUS | Status: DC | PRN
  Administered 2020-03-27: 10 mL via INTRA_ARTERIAL

## 2020-03-27 MED ORDER — ACETAMINOPHEN 325 MG PO TABS: 650.0000 mg | ORAL_TABLET | ORAL | Status: DC | PRN

## 2020-03-27 MED ORDER — LABETALOL HCL 5 MG/ML IV SOLN: 10.0000 mg | INTRAVENOUS | Status: DC | PRN

## 2020-03-27 MED ORDER — HEPARIN (PORCINE) IN NACL 1000-0.9 UT/500ML-% IV SOLN
INTRAVENOUS | Status: AC
  Filled 2020-03-27: qty 1000

## 2020-03-27 MED ORDER — SODIUM CHLORIDE 0.9 % IV SOLN: 250.0000 mL | INTRAVENOUS | Status: DC | PRN

## 2020-03-27 MED ORDER — SODIUM CHLORIDE 0.9 % WEIGHT BASED INFUSION: 1.0000 mL/kg/h | INTRAVENOUS | Status: DC

## 2020-03-27 MED ORDER — HYDRALAZINE HCL 20 MG/ML IJ SOLN: 10.0000 mg | INTRAMUSCULAR | Status: DC | PRN

## 2020-03-27 MED ORDER — ASPIRIN 81 MG PO CHEW: 81.0000 mg | CHEWABLE_TABLET | ORAL | Status: DC

## 2020-03-27 MED ORDER — SODIUM CHLORIDE 0.9 % IV SOLN: INTRAVENOUS | Status: AC

## 2020-03-27 MED ORDER — IOHEXOL 350 MG/ML SOLN
INTRAVENOUS | Status: DC | PRN
  Administered 2020-03-27: 130 mL via INTRA_ARTERIAL

## 2020-03-27 MED ORDER — FENTANYL CITRATE (PF) 100 MCG/2ML IJ SOLN
INTRAMUSCULAR | Status: DC | PRN
  Administered 2020-03-27: 25 ug via INTRAVENOUS

## 2020-03-27 SURGICAL SUPPLY — 22 items
CATH 5FR JL3.5 JR4 ANG PIG MP (CATHETERS) ×1 IMPLANT
CATH BALLN WEDGE 5F 110CM (CATHETERS) ×1 IMPLANT
CATH INFINITI 5 FR 3DRC (CATHETERS) ×1 IMPLANT
CATH INFINITI 5 FR AL2 (CATHETERS) ×1 IMPLANT
CATH INFINITI 5FR AL1 (CATHETERS) ×1 IMPLANT
CATH INFINITI 5FR JL4 (CATHETERS) ×1 IMPLANT
CATH LAUNCHER 5F EBU4.0 (CATHETERS) ×1 IMPLANT
CLOSURE MYNX CONTROL 5F (Vascular Products) ×1 IMPLANT
DEVICE RAD COMP TR BAND LRG (VASCULAR PRODUCTS) ×1 IMPLANT
GLIDESHEATH SLEND SS 6F .021 (SHEATH) ×1 IMPLANT
GUIDEWIRE .025 260CM (WIRE) ×1 IMPLANT
GUIDEWIRE INQWIRE 1.5J.035X260 (WIRE) IMPLANT
INQWIRE 1.5J .035X260CM (WIRE) ×2
KIT HEART LEFT (KITS) ×2 IMPLANT
PACK CARDIAC CATHETERIZATION (CUSTOM PROCEDURE TRAY) ×2 IMPLANT
SHEATH GLIDE SLENDER 4/5FR (SHEATH) ×1 IMPLANT
SHEATH PINNACLE 5F 10CM (SHEATH) ×1 IMPLANT
SYR MEDRAD MARK 7 150ML (SYRINGE) ×2 IMPLANT
TRANSDUCER W/STOPCOCK (MISCELLANEOUS) ×2 IMPLANT
TUBING CIL FLEX 10 FLL-RA (TUBING) ×2 IMPLANT
WIRE EMERALD 3MM-J .035X150CM (WIRE) ×1 IMPLANT
WIRE EMERALD ST .035X150CM (WIRE) ×1 IMPLANT

## 2020-03-27 NOTE — Interval H&P Note (Signed)
History and Physical Interval Note:  03/27/2020 1:34 PM  [name]  has presented today for surgery, with the diagnosis of aortic stenosis.  The various methods of treatment have been discussed with the patient and family. After consideration of risks, benefits and other options for treatment, the patient has consented to  Procedure(s): RIGHT/LEFT HEART CATH AND CORONARY ANGIOGRAPHY (N/A) as a surgical intervention.  The patient's history has been reviewed, patient examined, no change in status, stable for surgery.  I have reviewed the patient's chart and labs.  Questions were answered to the patient's satisfaction.    Cath Lab Visit (complete for each Cath Lab visit)  Clinical Evaluation Leading to the Procedure:   ACS: No.  Non-ACS:    Anginal Classification: CCS II  Anti-ischemic medical therapy: No Therapy  Non-Invasive Test Results: No non-invasive testing performed  Prior CABG: No previous CABG        Lauree Chandler

## 2020-03-27 NOTE — Progress Notes (Signed)
Received pt from  Short stay waiting on cath procedure. No acute distress noted at this time.  Pt alert and oriented X4, on monitor and consent signed.

## 2020-03-27 NOTE — Discharge Instructions (Signed)
Radial Site Care  This sheet gives you information about how to care for yourself after your procedure. Your health care provider may also give you more specific instructions. If you have problems or questions, contact your health care provider. What can I expect after the procedure? After the procedure, it is common to have:  Bruising and tenderness at the catheter insertion area. Follow these instructions at home: Medicines  Take over-the-counter and prescription medicines only as told by your health care provider. Insertion site care  Follow instructions from your health care provider about how to take care of your insertion site. Make sure you: ? Wash your hands with soap and water before you change your bandage (dressing). If soap and water are not available, use hand sanitizer. ? Change your dressing as told by your health care provider. ? Leave stitches (sutures), skin glue, or adhesive strips in place. These skin closures may need to stay in place for 2 weeks or longer. If adhesive strip edges start to loosen and curl up, you may trim the loose edges. Do not remove adhesive strips completely unless your health care provider tells you to do that.  Check your insertion site every day for signs of infection. Check for: ? Redness, swelling, or pain. ? Fluid or blood. ? Pus or a bad smell. ? Warmth.  Do not take baths, swim, or use a hot tub until your health care provider approves.  You may shower 24-48 hours after the procedure, or as directed by your health care provider. ? Remove the dressing and gently wash the site with plain soap and water. ? Pat the area dry with a clean towel. ? Do not rub the site. That could cause bleeding.  Do not apply powder or lotion to the site. Activity   For 24 hours after the procedure, or as directed by your health care provider: ? Do not flex or bend the affected arm. ? Do not push or pull heavy objects with the affected arm. ? Do not  drive yourself home from the hospital or clinic. You may drive 24 hours after the procedure unless your health care provider tells you not to. ? Do not operate machinery or power tools.  Do not lift anything that is heavier than 10 lb (4.5 kg), or the limit that you are told, until your health care provider says that it is safe.  Ask your health care provider when it is okay to: ? Return to work or school. ? Resume usual physical activities or sports. ? Resume sexual activity. General instructions  If the catheter site starts to bleed, raise your arm and put firm pressure on the site. If the bleeding does not stop, get help right away. This is a medical emergency.  If you went home on the same day as your procedure, a responsible adult should be with you for the first 24 hours after you arrive home.  Keep all follow-up visits as told by your health care provider. This is important. Contact a health care provider if:  You have a fever.  You have redness, swelling, or yellow drainage around your insertion site. Get help right away if:  You have unusual pain at the radial site.  The catheter insertion area swells very fast.  The insertion area is bleeding, and the bleeding does not stop when you hold steady pressure on the area.  Your arm or hand becomes pale, cool, tingly, or numb. These symptoms may represent a serious problem   that is an emergency. Do not wait to see if the symptoms will go away. Get medical help right away. Call your local emergency services (911 in the [place] Do not drive yourself to the hospital. Summary  After the procedure, it is common to have bruising and tenderness at the site.  Follow instructions from your health care provider about how to take care of your radial site wound. Check the wound every day for signs of infection.  Do not lift anything that is heavier than 10 lb (4.5 kg), or the limit that you are told, until your health care provider says  that it is safe. This information is not intended to replace advice given to you by your health care provider. Make sure you discuss any questions you have with your health care provider. Document Revised: 11/30/2017 Document Reviewed: 11/30/2017 [employer] Patient Education  2020 [employer] Angiogram, Care After This sheet gives you information about how to care for yourself after your procedure. Your health care provider may also give you more specific instructions. If you have problems or questions, contact your health care provider. What can I expect after the procedure? After the procedure, it is common to have bruising and tenderness at the catheter insertion area. Follow these instructions at home: Insertion site care  Follow instructions from your health care provider about how to take care of your insertion site. Make sure you: ? Wash your hands with soap and water before you change your bandage (dressing). If soap and water are not available, use hand sanitizer. ? Change your dressing as told by your health care provider. ? Leave stitches (sutures), skin glue, or adhesive strips in place. These skin closures may need to stay in place for 2 weeks or longer. If adhesive strip edges start to loosen and curl up, you may trim the loose edges. Do not remove adhesive strips completely unless your health care provider tells you to do that.  Do not take baths, swim, or use a hot tub until your health care provider approves.  You may shower 24-48 hours after the procedure or as told by your health care provider. ? Gently wash the site with plain soap and water. ? Pat the area dry with a clean towel. ? Do not rub the site. This may cause bleeding.  Do not apply powder or lotion to the site. Keep the site clean and dry.  Check your insertion site every day for signs of infection. Check for: ? Redness, swelling, or pain. ? Fluid or blood. ? Warmth. ? Pus or a bad smell. Activity  Rest  as told by your health care provider, usually for 1-2 days.  Do not lift anything that is heavier than 10 lbs. (4.5 kg) or as told by your health care provider.  Do not drive for 24 hours if you were given a medicine to help you relax (sedative).  Do not drive or use heavy machinery while taking prescription pain medicine. General instructions   Return to your normal activities as told by your health care provider, usually in about a week. Ask your health care provider what activities are safe for you.  If the catheter site starts bleeding, lie flat and put pressure on the site. If the bleeding does not stop, get help right away. This is a medical emergency.  Drink enough fluid to keep your urine clear or pale yellow. This helps flush the contrast dye from your body.  Take over-the-counter and prescription medicines only   as told by your health care provider.  Keep all follow-up visits as told by your health care provider. This is important. Contact a health care provider if:  You have a fever or chills.  You have redness, swelling, or pain around your insertion site.  You have fluid or blood coming from your insertion site.  The insertion site feels warm to the touch.  You have pus or a bad smell coming from your insertion site.  You have bruising around the insertion site.  You notice blood collecting in the tissue around the catheter site (hematoma). The hematoma may be painful to the touch. Get help right away if:  You have severe pain at the catheter insertion area.  The catheter insertion area swells very fast.  The catheter insertion area is bleeding, and the bleeding does not stop when you hold steady pressure on the area.  The area near or just beyond the catheter insertion site becomes pale, cool, tingly, or numb. These symptoms may represent a serious problem that is an emergency. Do not wait to see if the symptoms will go away. Get medical help right away. Call  your local emergency services (911 in the [place] Do not drive yourself to the hospital. Summary  After the procedure, it is common to have bruising and tenderness at the catheter insertion area.  After the procedure, it is important to rest and drink plenty of fluids.  Do not take baths, swim, or use a hot tub until your health care provider says it is okay to do so. You may shower 24-48 hours after the procedure or as told by your health care provider.  If the catheter site starts bleeding, lie flat and put pressure on the site. If the bleeding does not stop, get help right away. This is a medical emergency. This information is not intended to replace advice given to you by your health care provider. Make sure you discuss any questions you have with your health care provider. Document Revised: 10/07/2017 Document Reviewed: 09/29/2016 [employer] Patient Education  2020 [employer]   

## 2020-03-28 ENCOUNTER — Other Ambulatory Visit: Payer: Self-pay

## 2020-03-28 MED ORDER — METOPROLOL TARTRATE 50 MG PO TABS
ORAL_TABLET | ORAL | 0 refills | Status: DC
Start: 2020-03-28 — End: 2020-04-17

## 2020-03-31 ENCOUNTER — Other Ambulatory Visit: Payer: Self-pay

## 2020-04-01 ENCOUNTER — Encounter: Payer: Self-pay | Admitting: Surgery

## 2020-04-01 ENCOUNTER — Ambulatory Visit (HOSPITAL_COMMUNITY): Admission: RE | Admit: 2020-04-01 | Payer: PPO | Source: Ambulatory Visit

## 2020-04-01 ENCOUNTER — Ambulatory Visit (HOSPITAL_COMMUNITY)
Admission: RE | Admit: 2020-04-01 | Discharge: 2020-04-01 | Disposition: A | Discharge status: Home / Self Care | Payer: PPO | Source: Ambulatory Visit | Attending: Physician Assistant | Admitting: Physician Assistant

## 2020-04-01 ENCOUNTER — Other Ambulatory Visit: Payer: Self-pay

## 2020-04-01 ENCOUNTER — Ambulatory Visit (HOSPITAL_COMMUNITY)
Admission: RE | Admit: 2020-04-01 | Discharge: 2020-04-01 | Disposition: A | Discharge status: Home / Self Care | Payer: PPO | Source: Ambulatory Visit | Attending: Cardiovascular Disease | Admitting: Cardiovascular Disease

## 2020-04-01 ENCOUNTER — Institutional Professional Consult (permissible substitution): Payer: PPO | Admitting: Surgery

## 2020-04-01 VITALS — BP 122/62 | HR 60 | Temp 98.1°F | Resp 20 | Ht 67.0 in | Wt 176.0 lb

## 2020-04-01 DIAGNOSIS — Z01810 Encounter for preprocedural cardiovascular examination: Secondary | ICD-10-CM | POA: Diagnosis not present

## 2020-04-01 DIAGNOSIS — I700 Atherosclerosis of aorta: Secondary | ICD-10-CM | POA: Diagnosis not present

## 2020-04-01 DIAGNOSIS — I712 Thoracic aortic aneurysm, without rupture: Secondary | ICD-10-CM | POA: Diagnosis not present

## 2020-04-01 DIAGNOSIS — I350 Nonrheumatic aortic (valve) stenosis: Secondary | ICD-10-CM

## 2020-04-01 DIAGNOSIS — Z01818 Encounter for other preprocedural examination: Secondary | ICD-10-CM | POA: Diagnosis not present

## 2020-04-01 MED ORDER — IOHEXOL 350 MG/ML SOLN
100.0000 mL | Freq: Once | INTRAVENOUS | Status: AC | PRN
  Administered 2020-04-01: 100 mL via INTRAVENOUS

## 2020-04-01 NOTE — Progress Notes (Signed)
Carotid artery duplex completed. Refer to "CV Proc" under chart review to view preliminary results.  04/01/2020 1:20 PM Kelby Aline., MHA, RVT, RDCS, RDMS

## 2020-04-01 NOTE — H&P (View-Only) (Signed)
Patient ID: Corey Aguilar, male   DOB: Aug 29, 1934, 86 y.o.   MRN: [id number]  [facility] CONSULTATION REPORT  Referring Provider is Herminio Commons, MD Primary Cardiologist is Kate Sable, MD PCP is Monico Blitz, MD  Chief Complaint  Patient presents with  . Aortic Stenosis    Surgical eval, review all testing/studies    HPI:  The patient is an 86 year old gentleman with a history of multiple skin cancers, kidney stones, left bundle branch block, and severe aortic stenosis who was evaluated by Dr. Angelena Form in March 2021 for consideration of aortic valve replacement.  He had an echocardiogram in outside facility in December 2020 showing severe aortic stenosis and mild aortic insufficiency with normal left ventricular ejection fraction.  When he saw Dr. Angelena Form he denied any symptoms and plans are made for follow-up echocardiogram in 3 months.  His wife called the office on 03/18/2020 reporting that he has been having more symptoms with dizziness and feeling like he might pass out.  She felt like he may be having some chest discomfort and shortness of breath when lying down.  He had a follow-up echocardiogram on 03/19/2020 which showed severe aortic stenosis with a mean gradient of 44.5 mmHg and a peak gradient of 71.1 mmHg.  Aortic valve area was 0.73 cm.  Aortic insufficiency was mild with a pressure half-time of 389 ms.  There was mild mitral stenosis with a peak gradient of 11.7 mmHg.  The mean gradient was 4 mmHg.  There is trivial mitral regurgitation.  Left ventricular ejection fraction was 50 to 55%.  He subsequently underwent cardiac catheterization on 03/27/2020 which showed moderate nonobstructive disease in the mid LAD with about 60% stenosis.  There is about 60% ostial stenosis and a large second diagonal branch.  The first marginal branch had about 50% stenosis.  It was felt that this could be  managed medically.  The patient is here today with his wife.  They live in [place] and he is retired and works on his farm.  He says that he has remained fairly active but does have progressive exertional fatigue and shortness of breath.  He has had some dizziness and feeling like he might pass out.  He has also had shortness of breath laying down at night and has been sleeping sitting up in a chair at times.  He denies chest discomfort.  He has had right foot drop for many years due to spinal disease that was subsequently operated on.  He wears a right foot drop splint and uses a cane.  Past Medical History:  Diagnosis Date  . BCC (basal cell carcinoma of skin) 08/13/2010   V of neck- (tx p bx)  . BCC (basal cell carcinoma of skin) 12/11/2014   v of neck (CX35FU)  . BCC (basal cell carcinoma of skin) 04/07/1989   left cheek  . Heart murmur    Per patient, echocardiogram done was told nothing to be concerned about  . History of kidney stones   . Left hip pain   . Lumbago   . SCC (squamous cell carcinoma) 07/20/2011   left forearm (CX35FU)  . SCC (squamous cell carcinoma) 10/05/2011   Left ear (CX35FU)  . SCC (squamous cell carcinoma) 08/22/2018   left jawline (CX35FU)  . Superficial basal cell carcinoma (BCC) 07/20/2011   tip of nose (CX35FU)    Past Surgical History:  Procedure Laterality Date  . BACK SURGERY  2015  . RIGHT/LEFT HEART CATH AND CORONARY ANGIOGRAPHY N/A 03/27/2020   Procedure: RIGHT/LEFT HEART CATH AND CORONARY ANGIOGRAPHY;  Surgeon: Burnell Blanks, MD;  Location: [facility];  Service: Cardiovascular;  Laterality: N/A;  . TOTAL HIP ARTHROPLASTY Left 05/29/2019   Procedure: TOTAL HIP ARTHROPLASTY ANTERIOR APPROACH;  Surgeon: Paralee Cancel, MD;  Location: [facility];  Service: Orthopedics;  Laterality: Left;  70 mins    Family History  Problem Relation Age of Onset  . Heart disease Mother        enlarged heart  . Diabetes Mother   . Heart attack  Father   . Cancer Sister   . Cancer Brother   . Colon cancer Neg Hx     Social History   Socioeconomic History  . Marital status: Married    Spouse name: Not on file  . Number of children: 3  . Years of education: Not on file  . Highest education level: Not on file  Occupational History  . Occupation: Retired Armed forces logistics/support/administrative officer.  Tobacco Use  . Smoking status: Former Smoker    Quit date: 1957    Years since quitting: 64.4  . Smokeless tobacco: Never Used  Substance and Sexual Activity  . Alcohol use: Yes    Comment: occasional wine with a meal  . Drug use: Never  . Sexual activity: Not on file  Other Topics Concern  . Not on file  Social History Narrative   3 grown children.       Patient has limited literacy.    Social Determinants of Health   Financial Resource Strain:   . Difficulty of Paying Living Expenses:   Food Insecurity:   . Worried About Charity fundraiser in the Last Year:   . Arboriculturist in the Last Year:   Transportation Needs:   . Film/video editor (Medical):   [name] Lack of Transportation (Non-Medical):   Physical Activity:   . Days of Exercise per Week:   . Minutes of Exercise per Session:   Stress:   . Feeling of Stress :   Social Connections:   . Frequency of Communication with Friends and Family:   . Frequency of Social Gatherings with Friends and Family:   . Attends Religious Services:   . Active Member of Clubs or Organizations:   . Attends Archivist Meetings:   [name] Marital Status:   Intimate Partner Violence:   . Fear of Current or Ex-Partner:   . Emotionally Abused:   [name] Physically Abused:   . Sexually Abused:     Current Outpatient Medications  Medication Sig Dispense Refill  . acetaminophen (TYLENOL) 500 MG tablet Take 2 tablets (1,000 mg total) by mouth every 8 (eight) hours. (Patient taking differently: Take 1,000 mg by mouth every 6 (six) hours as needed for mild pain or moderate pain. ) 30 tablet 0  . furosemide  (LASIX) 20 MG tablet Take 1 tablet (20 mg total) by mouth daily. 90 tablet 3  . metoprolol tartrate (LOPRESSOR) 50 MG tablet Take as directed prior to CT scan on 04/01/20 1 tablet 0  . Sennosides (EX-LAX) 15 MG CHEW Chew 15 mg by mouth daily as needed (constipation). Ex-lax     No current facility-administered medications for this visit.    Allergies  Allergen Reactions  . Penicillins     Did it involve swelling of the face/tongue/throat, SOB, or low BP? No Did it involve sudden or severe rash/hives, skin peeling,  or any reaction on the inside of your mouth or nose? Yes Did you need to seek medical attention at a hospital or doctor's office? No When did it last happen?childhood If all above answers are "NO", may proceed with cephalosporin use.  . Ibuprofen Rash      Review of Systems:   General:  Normal appetite, + decreased energy, no weight gain, no weight loss, no fever  Cardiac:  no chest pain with exertion, no chest pain at rest, +SOB with  exertion, no resting SOB, no PND, + orthopnea, no palpitations, no arrhythmia, no atrial fibrillation, + LE edema, + dizzy spells, no syncope  Respiratory:  + exertional shortness of breath, no home oxygen, no productive cough, no dry cough, no bronchitis, no wheezing, no hemoptysis, no asthma, no pain with inspiration or cough, no sleep apnea, no CPAP at night  GI:   no difficulty swallowing, no reflux, no frequent heartburn, no hiatal hernia, no abdominal pain, + constipation, no diarrhea, no hematochezia, no hematemesis, no melena  GU:   no dysuria,  + frequency, no urinary tract infection, no hematuria, no enlarged prostate, + kidney stones, no kidney disease  Vascular:  bi pain suggestive of claudication, bi pain in feet, + leg cramps, bi varicose veins, bi DVT, bi non-healing foot ulcer  Neuro:   no stroke, no TIA's, no seizures, no headaches, no temporary blindness one eye,  no slurred speech, + peripheral neuropathy, no chronic pain,  + instability of gait due to right foot drop, no memory/cognitive dysfunction  Musculoskeletal: no arthritis, no joint swelling, no myalgias, + difficulty walking, + reduced mobility   Skin:   no rash, no itching, no skin infections, no pressure sores or ulcerations  Psych:   no anxiety, no depression, no nervousness, no unusual recent stress  Eyes:   no blurry vision, no floaters, no recent vision changes, + wears glasses or contacts  ENT:   + hearing loss, no loose or painful teeth, + dentures  Hematologic:  bi easy bruising, bi abnormal bleeding, bi clotting disorder, bi frequent epistaxis  Endocrine:  bi diabetes, does not check CBG's at home        Physical Exam:   BP 122/62   Pulse 60   Temp 98.1 F (36.7 C) (Skin)   Resp 20   Ht 5\' 7"  (1.702 m)   Wt 176 lb (79.8 kg)   SpO2 94% Comment: RA  BMI 27.57 kg/m   General:  Elderly, well-appearing, walks very slow and unsteady  HEENT:  Unremarkable, NCAT, PERLA, EOMI, edentulous  Neck:   no JVD, no bruits, no adenopathy   Chest:   Rales in lower lobe bilaterally  CV:   RRR, grade lll/VI crescendo/decrescendo murmur heard best at RSB,  no diastolic murmur  Abdomen:  soft, non-tender, no masses   Extremities:  warm, well-perfused, pulses palpable at ankle, no LE edema  Rectal/GU  Deferred  Neuro:   Grossly non-focal and symmetrical throughout  Skin:   Clean and dry, no rashes, no breakdown   Diagnostic Tests:  ECHOCARDIOGRAM REPORT       Patient Name:  Corey Aguilar Date of Exam: 03/19/2020  Medical Rec #: [id number]    Height:    67.0 in  Accession #:  [id number]   Weight:    176.0 lb  Date of Birth: December 22, 1933    BSA:     1.915 m  Patient Age:  25 years    BP:  156/80 mmHg  Patient Gender: M        HR:      69 bpm.  Exam Location: [facility]   Procedure: 2D Echo, 3D Echo, Cardiac Doppler, Color Doppler and Strain  Analysis   Indications:  I35 Aortic  stenosis    History:    Patient has prior history of Echocardiogram examinations,  most         recent 10/29/2019. Risk Factors:Former Smoker.    Sonographer:  Jessee Avers, RDCS  Referring Phys: [id number] [place]    1. Left ventricular ejection fraction, by estimation, is 50 to 55%. The  left ventricle has low normal function. The left ventricle demonstrates  regional wall motion abnormalities (see scoring diagram/findings for  description). There is mild left  ventricular hypertrophy. Left ventricular diastolic parameters are  consistent with Grade I diastolic dysfunction (impaired relaxation). There  is moderate hypokinesis of the left ventricular, basal-mid inferior wall.  2. Right ventricular systolic function is normal. The right ventricular  size is normal. There is normal pulmonary artery systolic pressure.  3. Left atrial size was mildly dilated.  4. The mitral valve is abnormal. Trivial mitral valve regurgitation. Mild  mitral stenosis. The mean mitral valve gradient is 4.0 mmHg with average  heart rate of 66 bpm.  5. The aortic valve has an indeterminant number of cusps. Aortic valve  regurgitation is mild. Severe aortic valve stenosis. Aortic valve area, by  VTI measures 0.73 cm. Aortic valve mean gradient measures 44.5 mmHg.  Aortic valve Vmax measures 4.22 m/s.  6. The inferior vena cava is normal in size with greater than 50%  respiratory variability, suggesting right atrial pressure of 3 mmHg.   Comparison(s): Report only 10/29/19 EF 60-65%. Severe AS 60 mmHg peak PG,  42 mmHg mean PG. The decline in gradient may reflect decreased cardiac  output.   FINDINGS  Left Ventricle: Left ventricular ejection fraction, by estimation, is 50  to 55%. The left ventricle has low normal function. The left ventricle  demonstrates regional wall motion abnormalities. Moderate hypokinesis of  the left ventricular, basal-mid    inferior wall. The left ventricular internal cavity size was normal in  size. There is mild left ventricular hypertrophy. Left ventricular  diastolic parameters are consistent with Grade I diastolic dysfunction  (impaired relaxation). Indeterminate filling  pressures.   Right Ventricle: The right ventricular size is normal. No increase in  right ventricular wall thickness. Right ventricular systolic function is  normal. There is normal pulmonary artery systolic pressure. The tricuspid  regurgitant velocity is 2.62 m/s, and  with an assumed right atrial pressure of 3 mmHg, the estimated right  ventricular systolic pressure is [id number] mmHg.   Left Atrium: Left atrial size was mildly dilated.   Right Atrium: Right atrial size was normal in size.   Pericardium: There is no evidence of pericardial effusion.   Mitral Valve: The mitral valve is abnormal. Mild to moderate mitral  annular calcification. Trivial mitral valve regurgitation. Mild mitral  valve stenosis. MV peak gradient, 11.7 mmHg. The mean mitral valve  gradient is 4.0 mmHg with average heart rate of  66 bpm.   Tricuspid Valve: The tricuspid valve is grossly normal. Tricuspid valve  regurgitation is mild.   Aortic Valve: The aortic valve has an indeterminant number of cusps.  Aortic valve regurgitation is mild. Aortic regurgitation PHT measures 389  msec. Severe aortic stenosis is present. There is severe calcifcation of  the  aortic valve. Aortic valve mean  gradient measures 44.5 mmHg. Aortic valve peak gradient measures 71.1  mmHg. Aortic valve area, by VTI measures 0.73 cm.   Pulmonic Valve: The pulmonic valve was normal in structure. Pulmonic valve  regurgitation is not visualized.   Aorta: The aortic root and ascending aorta are structurally normal, with  no evidence of dilitation.   Venous: The inferior vena cava is normal in size with greater than 50%  respiratory variability, suggesting right atrial pressure  of 3 mmHg.   IAS/Shunts: No atrial level shunt detected by color flow Doppler.     LEFT VENTRICLE  PLAX 2D  LVIDd:     5.10 cm Diastology  LVIDs:     3.50 cm LV e' lateral:  7.15 cm/s  LV PW:     1.10 cm LV E/e' lateral: 12.4  LV IVS:    1.40 cm LV e' medial:  3.16 cm/s  LVOT diam:   2.00 cm LV E/e' medial: 28.0  LV SV:     86  LV SV Index:  45    2D Longitudinal Strain  LVOT Area:   3.14 cm 2D Strain GLS (A2C):  -19.3 %             2D Strain GLS (A3C):  -17.2 %             2D Strain GLS (A4C):  -16.6 %             2D Strain GLS Avg:   -17.7 %               3D Volume EF:             3D EF:    46 %             LV EDV:    201 ml             LV ESV:    110 ml             LV SV:    92 ml   RIGHT VENTRICLE  RV Basal diam: 4.00 cm  RV S prime:   11.50 cm/s  TAPSE (M-mode): 2.5 cm  RVSP:      30.5 mmHg   LEFT ATRIUM       Index    RIGHT ATRIUM      Index  LA diam:    4.40 cm 2.30 cm/m RA Pressure: 3.00 mmHg  LA Vol (A2C):  69.5 ml 36.29 ml/m RA Area:   11.00 cm  LA Vol (A4C):  63.4 ml 33.10 ml/m RA Volume:  18.20 ml 9.50 ml/m  LA Biplane Vol: 73.2 ml 38.22 ml/m  AORTIC VALVE  AV Area (Vmax):  0.80 cm  AV Area (Vmean):  0.75 cm  AV Area (VTI):   0.73 cm  AV Vmax:      421.50 cm/s  AV Vmean:     305.200 cm/s  AV VTI:      1.170 m  AV Peak Grad:   71.1 mmHg  AV Mean Grad:   44.5 mmHg  LVOT Vmax:     107.00 cm/s  LVOT Vmean:    72.700 cm/s  LVOT VTI:     0.273 m  LVOT/AV VTI ratio: 0.23  AI PHT:      389 msec    AORTA  Ao Root diam: 3.70 cm  Ao Asc diam: 3.60 cm   MITRAL VALVE  TRICUSPID VALVE  MV Area (PHT): 4.68 cm   TR Peak grad:  27.5 mmHg  MV Peak grad: 11.7 mmHg  TR Vmax:    262.00 cm/s  MV  Mean grad: 4.0 mmHg   Estimated RAP: 3.00 mmHg  MV Vmax:    1.71 m/s   RVSP:      30.5 mmHg  MV Vmean:   90.3 cm/s  MV Decel Time: 162 msec   SHUNTS  MV E velocity: 88.60 cm/s  Systemic VTI: 0.27 m  MV A velocity: 138.00 cm/s Systemic Diam: 2.00 cm  MV E/A ratio: 0.64   Lyman Bishop MD  Electronically signed by Lyman Bishop MD  Signature Date/Time: 03/19/2020/5:45:12 PM     Physicians Panel Physicians Referring Physician Case Authorizing Physician  Burnell Blanks, MD (Primary)       Procedures RIGHT/LEFT HEART CATH AND CORONARY ANGIOGRAPHY     Conclusion 1st Mrg lesion is 50% stenosed.  Ost Cx to Prox Cx lesion is 20% stenosed.  Prox LAD lesion is 30% stenosed.  1st Diag lesion is 60% stenosed.  2nd Diag lesion is 60% stenosed.  Mid LAD lesion is 60% stenosed.  Prox RCA lesion is 70% stenosed. 1. Moderate non-obstructive disease in the mid LAD, second Diagonal branch and first obtuse marginal branch.  2. Moderately severe stenosis in the small to moderate caliber, non-dominant RCA  3. Severe aortic stenosis by echo. I did not cross the aortic valve today.  Recommendations: Will continue workup for TAVR. Medical management of non-obstructive CAD.      Recommendations Antiplatelet/Anticoag Will continue workup for TAVR. Medical management of non-obstructive CAD.        Indications Severe aortic stenosis [I35.0 (ICD-10-CM)]     Procedural Details Technical Details Indication: Severe aortic stenosis. TAVR workup.   Procedure: The risks, benefits, complications, treatment options, and expected outcomes were discussed with the patient. The patient and/or family concurred with the proposed plan, giving informed consent. The patient was brought to the cath lab after IV hydration was given. The patient was sedated with Versed and Fentanyl. The 18 gauge IV catheter present in the right antecubital vein was changed for a 5 [place] sheath. Right  heart catheterization performed with a balloon tipped catheter. The right wrist was prepped and draped in a sterile fashion. 1% lidocaine was used for local anesthesia. Using the modified Seldinger access technique, a 5 French sheath was placed in the right radial artery. 3 mg Verapamil was given through the sheath. 4000 units IV heparin was given. I could not engage the RCA or the left main due to severe tortuosity in the right innominate artery. The right groin was prepped and draped. A 5 French sheath was placed in the right femoral artery. Standard diagnostic catheters were used to perform selective coronary angiography. The RCA was engaged with a [place] catheter and the left main was engaged with a EBU 4.0 guiding catheter. I did not cross the aortic valve. A Mynx closure device was placed in the right femoral artery. The sheath was removed from the right radial artery and a Terumo hemostasis band was applied at the arteriotomy site on the right wrist.    Estimated blood loss <50 mL.   During this procedure medications were administered to achieve and maintain moderate conscious sedation while the patient's heart rate, blood pressure, and oxygen saturation were continuously monitored and I was present face-to-face 100% of this time.     Medications (Filter: Administrations occurring from 03/27/20 1531 to  03/27/20 1646)  Continuous medications are totaled by the amount administered until 03/27/20 1646.  Heparin (Porcine) in NaCl 1000-0.9 UT/500ML-% SOLN (mL) Total volume: 1,000 mL  Date/Time   Rate/Dose/Volume Action  03/27/20 1556  500 mL Given  1557  500 mL Given  fentaNYL (SUBLIMAZE) injection (mcg) Total dose: 25 mcg  Date/Time   Rate/Dose/Volume Action  03/27/20 1533  25 mcg Given  midazolam (VERSED) injection (mg) Total dose: 1 mg  Date/Time   Rate/Dose/Volume Action  03/27/20 1533  1 mg Given  lidocaine (PF) (XYLOCAINE) 1 % injection (mL) Total volume: 14 mL  Date/Time    Rate/Dose/Volume Action  03/27/20 1539  2 mL Given  1539  2 mL Canceled Entry  1548  2 mL Given  1600  10 mL Given  Radial Cocktail/Verapamil only (mL) Total volume: 10 mL  Date/Time   Rate/Dose/Volume Action  03/27/20 1549  10 mL Given  heparin sodium (porcine) injection (Units) Total dose: 4,000 Units  Date/Time   Rate/Dose/Volume Action  03/27/20 1550  4,000 Units Given  iohexol (OMNIPAQUE) 350 MG/ML injection (mL) Total volume: 130 mL  Date/Time   Rate/Dose/Volume Action  03/27/20 1635  130 mL Given     Sedation Time Sedation Time Physician-1: 56 minutes 17 seconds        Contrast Medication Name Total Dose  iohexol (OMNIPAQUE) 350 MG/ML injection 130 mL     Radiation/Fluoro Fluoro time: 20.4 (min)  DAP: [id number] (mGycm2)  Cumulative Air Kerma: 0000000 (mGy)     Complications Complications documented before study signed (03/27/2020 4:46 PM)  RIGHT/LEFT HEART CATH AND CORONARY ANGIOGRAPHY  None Documented by Burnell Blanks, MD 03/27/2020 4:45 PM  Date Found: 03/27/2020  Time Range: Intraprocedure       Coronary Findings Diagnostic Dominance: Left  Left Anterior Descending  Vessel is large.  Prox LAD lesion 30% stenosed  Prox LAD lesion is 30% stenosed. The lesion is calcified.  Mid LAD lesion 60% stenosed  Mid LAD lesion is 60% stenosed. The lesion is calcified.   First Diagonal Branch  Vessel is small in size.  1st Diag lesion 60% stenosed  1st Diag lesion is 60% stenosed.   Second Diagonal Branch  Vessel is moderate in size.  2nd Diag lesion 60% stenosed  2nd Diag lesion is 60% stenosed. The lesion is calcified.   Left Circumflex  Ost Cx to Prox Cx lesion 20% stenosed  Ost Cx to Prox Cx lesion is 20% stenosed.   First Obtuse Marginal Branch  1st Mrg lesion 50% stenosed  1st Mrg lesion is 50% stenosed.   Right Coronary Artery  Vessel is moderate in size.  Prox RCA lesion 70% stenosed  Prox RCA lesion is 70% stenosed.   Intervention No interventions have been documented.                            Coronary Diagrams Diagnostic Dominance: Left  &&&&&  Intervention      Implants  Vascular Products  Closure Mynx Control 33f [id number] - Implanted  Inventory item: CLOSURE [facility] CONTROL 42F Model/Cat number: [id number]  Manufacturer:  Lot number: [id number]  Device identifier: [id number] Device identifier type: GS1  GUDID Information   Request status Successful     Brand name: MYNX CONTROL Version/Model: [id number]   Company name: [employer] safety info as of 03/27/20: MR Safe   Contains dry or latex rubber: No  GMDN P.T. name: Wound hydrogel dressing, non-antimicrobial     As of 03/27/2020    Status: Implanted          Syngo Images Link to Procedure Log  Show images for CARDIAC CATHETERIZATION Procedure Log     Images on Long Term Storage   Show images for Luisalfredo, Saiz         Hemo Data   Most Recent Value  Fick Cardiac Output 6.16 L/min  Fick Cardiac Output Index 3.27 (L/min)/BSA  RA A Wave 11 mmHg  RA V Wave 9 mmHg  RA Mean 8 mmHg  RV Systolic Pressure 31 mmHg  RV Diastolic Pressure 8 mmHg  RV EDP 11 mmHg  PA Systolic Pressure 34 mmHg  PA Diastolic Pressure 15 mmHg  PA Mean 22 mmHg  PW A Wave 19 mmHg  PW V Wave 18 mmHg  PW Mean 17 mmHg  AO Systolic Pressure A999333 mmHg  AO Diastolic Pressure 73 mmHg  AO Mean 97 mmHg  QP/QS 1  TPVR Index 6.74 HRUI  TSVR Index 29.7 HRUI  PVR SVR Ratio 0.06  TPVR/TSVR Ratio 0.23    ADDENDUM REPORT: 04/01/2020 14:24  ADDENDUM: Extracardiac findings were described separately under dictation for contemporaneously obtained CTA chest, abdomen and pelvis dated 04/01/2020. Please see that dictation for full description of relevant extracardiac findings.   Electronically Signed   By: Vinnie Langton M.D.   On: 04/01/2020 14:24   Addended by Etheleen Mayhew, MD on 04/01/2020 2:26  PM    Study Result  CLINICAL DATA:  Aortic Stenosis  EXAM: Cardiac TAVR CT  TECHNIQUE: The patient was scanned on a Siemens Force [id number] slice scanner. A 120 kV retrospective scan was triggered in the ascending thoracic aorta at 140 HU's. Gantry rotation speed was 250 msecs and collimation was .6 mm. No beta blockade or nitro were given. The 3D data set was reconstructed in 5% intervals of the R-R cycle. Systolic and diastolic phases were analyzed on a dedicated work station using MPR, MIP and VRT modes. The patient received 80 cc of contrast.  FINDINGS: Aortic Valve: Tri leaflet calcified with restricted leaflet motion  Aorta: Mild aortic root dilatation. Moderate calcific atherosclerosis Tortuous left subclavian  Sino-tubular Junction: 28 mm  Ascending Thoracic Aorta: 38 mm  Aortic Arch: 31 mm  Descending Thoracic Aorta: 27 mm  Sinus of Valsalva Measurements:  Non-coronary: 32.6 mm  Right - coronary: 31.1 mm  Left -   coronary: 32.9 mm  Coronary Artery Height above Annulus:  Left Main: 13.9 mm above annulus  Right Coronary: RCA 11.9 mm above annulus  Virtual Basal Annulus Measurements:  Maximum / Minimum Diameter: 24.5 mm x 23.3 mm  Perimeter: 75 mm c  Area: 451 mm 2  Coronary Arteries: Sufficient height above annulus for deployment  Optimum Fluoroscopic Angle for Delivery: LAO 15 Cranial 2 degrees  IMPRESSION: 1. Tri leaflet AV with annular area of 451 mm 2 suitable for a 26 mm Sapien 2 valve. Note there is bulky leaflet calcium and annular calcium at the base of the non coronary cusp extending into the base of the mitral leaflet that may increase risk of PVL and heart block  2. Mild Aortic root dilatation 3.8 cm Tortuous left subclavian probably not suitable for access point  3. Optimum angiographic angle for deployment LAO 15 cranial 2 degrees  4.  Coronary arteries sufficient height above annulus for  deployment  Jenkins Rouge  Electronically Signed: By: Eligha Bridegroom.D.  On: 04/01/2020 12:39      CLINICAL DATA:  86 year old male with history of severe aortic stenosis. Preprocedural study prior to potential transcatheter aortic valve replacement (TAVR) procedure.  EXAM: CT ANGIOGRAPHY CHEST, ABDOMEN AND PELVIS  TECHNIQUE: Non-contrast CT of the chest was initially obtained.  Multidetector CT imaging through the chest, abdomen and pelvis was performed using the standard protocol during bolus administration of intravenous contrast. Multiplanar reconstructed images and MIPs were obtained and reviewed to evaluate the vascular anatomy.  CONTRAST:  161mL OMNIPAQUE IOHEXOL 350 MG/ML SOLN  COMPARISON:  CT the abdomen and pelvis 06/01/2018.  FINDINGS: CTA CHEST FINDINGS  Cardiovascular: Heart size is normal. There is no significant pericardial fluid, thickening or pericardial calcification. There is aortic atherosclerosis, as well as atherosclerosis of the great vessels of the mediastinum and the coronary arteries, including calcified atherosclerotic plaque in the left main, left anterior descending, left circumflex and right coronary arteries. Ectasia of the ascending thoracic aorta (4.1 cm in diameter). Severe thickening and calcification of the aortic valve.  Mediastinum/Lymph Nodes: No pathologically enlarged mediastinal or hilar lymph nodes. Esophagus is unremarkable in appearance. No axillary lymphadenopathy.  Lungs/Pleura: 4 mm right lower lobe subpleural pulmonary nodule (axial image 63 of series 8). No other larger more suspicious appearing pulmonary nodules or masses are noted. No acute consolidative airspace disease. No pleural effusions.  Musculoskeletal/Soft Tissues: There are no aggressive appearing lytic or blastic lesions noted in the visualized portions of the skeleton.  CTA ABDOMEN AND PELVIS FINDINGS  Hepatobiliary: Subcentimeter  low-attenuation lesion in the central aspect of segment 8 of the liver, too small to characterize, but statistically likely to represent a tiny cyst. No other suspicious hepatic lesions. No intra or extrahepatic biliary ductal dilatation. Gallbladder is normal in appearance.  Pancreas: No pancreatic mass. No pancreatic ductal dilatation. No pancreatic or peripancreatic fluid collections or inflammatory changes.  Spleen: Unremarkable.  Adrenals/Urinary Tract: 2.2 cm low-attenuation lesion in the interpolar region of the left kidney, compatible with a simple cyst. Multiple other parapelvic lesions associated with the kidneys bilaterally (left greater than right), compatible with peripelvic cysts. Bilateral adrenal glands are normal in appearance. No hydroureteronephrosis. Urinary bladder is partially obscured by beam hardening artifact from the patient's left hip arthroplasty, but visualized portions are unremarkable in appearance.  Stomach/Bowel: Normal appearance of the stomach. No pathologic dilatation of small bowel or colon. Numerous colonic diverticulae are noted, particularly in the sigmoid colon, without surrounding inflammatory changes to suggest an acute diverticulitis at this time. Normal appendix.  Vascular/Lymphatic: Aortic atherosclerosis, with vascular findings and measurements pertinent to potential TAVR procedure, as detailed below. Aneurysmal dilatation of the common iliac arteries bilaterally (2.2 cm on the right and 2.3 cm on the left). No lymphadenopathy noted in the abdomen or pelvis.  Reproductive: Prostate gland and seminal vesicles are largely obscured by beam hardening artifact from the patient's left hip arthroplasty.  Other: No significant volume of ascites.  No pneumoperitoneum.  Musculoskeletal: Status post PLIF from L1 through S1. Status post left hip arthroplasty. There are no aggressive appearing lytic or blastic lesions noted in the  visualized portions of the skeleton.  VASCULAR MEASUREMENTS PERTINENT TO TAVR:  AORTA:  Minimal Aortic Diameter-16 x 16 mm  Severity of Aortic Calcification-moderate to severe  RIGHT PELVIS:  Right Common Iliac Artery -  Minimal Diameter-11.2 x 9.8 mm  Tortuosity-moderate  Calcification-moderate  Right External Iliac Artery -  Minimal Diameter-10.0 x 9.3 mm  Tortuosity-extreme  Calcification-none  Right Common Femoral Artery -  Minimal Diameter-10.5 x 9.9 mm  Tortuosity-mild  Calcification-moderate  LEFT PELVIS:  Left Common Iliac Artery -  Minimal Diameter-13.2 x 11.4 mm  Tortuosity-moderate  Calcification-moderate  Left External Iliac Artery -  Minimal Diameter-10.9 x 10.9 mm  Tortuosity-extreme  Calcification-minimal  Left Common Femoral Artery -  Minimal Diameter-11.6 x 10.6 mm  Tortuosity-mild  Calcification-none  Review of the MIP images confirms the above findings.  IMPRESSION: 1. Vascular findings and measurements pertinent to potential TAVR procedure, as detailed above. 2. Severe thickening calcification of the aortic valve, compatible with reported clinical history of severe aortic stenosis. 3. Aortic atherosclerosis, in addition to left main and 3 vessel coronary artery disease. In addition, there is ectasia of the ascending thoracic aorta (4.1 cm in diameter). Recommend annual imaging followup by CTA or MRA. This recommendation follows 2010 ACCF/AHA/AATS/ACR/ASA/SCA/SCAI/SIR/STS/SVM Guidelines for the Diagnosis and Management of Patients with Thoracic Aortic Disease. Circulation. 2010; [id number]. Aortic aneurysm NOS (ICD10-I71.9). 4. There is also aneurysmal dilatation of the common iliac arteries bilaterally. 5. 4 mm subpleural nodule in the right lower lobe, nonspecific, but statistically likely benign. No follow-up needed if patient is low-risk. Non-contrast chest CT can be considered  in 12 months if patient is high-risk. This recommendation follows the consensus statement: Guidelines for Management of Incidental Pulmonary Nodules Detected on CT Images: From the Fleischner Society 2017; Radiology 2017; 284:228-243. 6. Severe colonic diverticulosis without evidence of acute diverticulitis at this time. 7. Additional incidental findings, as above.   Electronically Signed   By: Vinnie Langton M.D.   On: 04/01/2020 14:22  STS score Procedure: Isolated AVR Risk of Mortality:2.131% Renal Failure:1.557% Permanent Stroke:1.392% Prolonged Ventilation:6.792% DSW Infection:0.116% Reoperation:4.277% Morbidity or Mortality:11.859% Short Length of Stay:35.297% Long Length of Stay:5.949%   Impression:  This 86 year old gentleman has stage D, severe, symptomatic aortic stenosis with New York Heart Association class III symptoms of exertional fatigue and shortness of breath as well as episodic dizziness and orthopnea consistent with chronic diastolic congestive heart failure. I have personally reviewed his 2D echocardiogram, cardiac catheterization, and CTA studies his echocardiogram shows a trileaflet aortic valve with heavy leaflet calcification and restricted mobility. The mean gradient is 45 mmHg consistent with severe aortic stenosis. Left ventricular systolic function is normal. He has mild mitral stenosis. Cardiac catheterization shows moderate nonobstructive disease that can be managed medically since the patient has no anginal symptoms. His coronary arteries are heavily calcified. I agree that aortic valve replacement is indicated in this patient for the relief of his progressive symptoms and to prevent left ventricular deterioration and worsening congestive heart failure. He is a low surgical risk patient although if I was going to do open surgical aortic valve replacement I would also bypass his coronary arteries. Given his advanced age and reduced mobility associated  with right foot drop and balance difficulties I think the transcatheter aortic valve replacement would be a reasonable alternative for him. His gated cardiac CTA shows anatomy suitable for transcatheter aortic valve replacement using a 26 mm sapient three valve. There is bulky calcification in the leaflets and in the annulus with some calcification extending down onto the anterior mitral leaflet that may increase the risk of perivalvular leak. He has baseline left bundle branch block morphology and would be at increased risk for needing a permanent pacemaker whether he has TAVR or open surgery.  The patient and his wife are counseled at length regarding treatment alternatives for management of severe symptomatic aortic stenosis. The risks and benefits of surgical intervention  has been discussed in detail. Long-term prognosis with medical therapy was discussed. Alternative approaches such as conventional surgical aortic valve replacement, transcatheter aortic valve replacement, and palliative medical therapy were compared and contrasted at length. This discussion was placed in the context of the patient's own specific clinical presentation and past medical history. All of their questions have been addressed.   The patient has been advised of a variety of complications that might develop including but not limited to risks of death, stroke, paravalvular leak, aortic dissection or other major vascular complications, aortic annulus rupture, device embolization, cardiac rupture or perforation, mitral regurgitation, acute myocardial infarction, arrhythmia, heart block or bradycardia requiring permanent pacemaker placement, congestive heart failure, respiratory failure, renal failure, pneumonia, infection, other late complications related to structural valve deterioration or migration, or other complications that might ultimately cause a temporary or permanent loss of functional independence or other long term  morbidity. The patient provides full informed consent for the procedure as described and all questions were answered.     Plan:  I will review his CTA studies with Dr. Angelena Form and tentatively plan to proceed with transfemoral TAVR on Tuesday, 04/08/2020.   I spent 45 minutes performing this consultation and > 50% of this time was spent face to face counseling and coordinating the care of this patient's severe symptomatic aortic stenosis.     Gaye Pollack, MD 04/01/2020 4:12 PM

## 2020-04-01 NOTE — Progress Notes (Signed)
Patient ID: Corey Aguilar, male   DOB: 03/03/1934, 86 y.o.   MRN: [id number]  [facility]SULTATION REPORT  Referring Provider is Corey Commons, MD Primary Cardiologist is Corey Sable, MD PCP is Corey Blitz, MD  Chief Complaint  Patient presents with  . Aortic Stenosis    Surgical eval, review all testing/studies    HPI:  The patient is an 86 year old gentleman with a history of multiple skin cancers, kidney stones, left bundle branch block, and severe aortic stenosis who was evaluated by Dr. Angelena Form in March 2021 for consideration of aortic valve replacement.  He had an echocardiogram in outside facility in December 2020 showing severe aortic stenosis and mild aortic insufficiency with normal left ventricular ejection fraction.  When he saw Dr. Angelena Form he denied any symptoms and plans are made for follow-up echocardiogram in 3 months.  His wife called the office on 03/18/2020 reporting that he has been having more symptoms with dizziness and feeling like he might pass out.  She felt like he may be having some chest discomfort and shortness of breath when lying down.  He had a follow-up echocardiogram on 03/19/2020 which showed severe aortic stenosis with a mean gradient of 44.5 mmHg and a peak gradient of 71.1 mmHg.  Aortic valve area was 0.73 cm.  Aortic insufficiency was mild with a pressure half-time of 389 ms.  There was mild mitral stenosis with a peak gradient of 11.7 mmHg.  The mean gradient was 4 mmHg.  There is trivial mitral regurgitation.  Left ventricular ejection fraction was 50 to 55%.  He subsequently underwent cardiac catheterization on 03/27/2020 which showed moderate nonobstructive disease in the mid LAD with about 60% stenosis.  There is about 60% ostial stenosis and a large second diagonal branch.  The first marginal branch had about 50% stenosis.  It was felt that this could be  managed medically.  The patient is here today with his wife.  They live in [place] and he is retired and works on his farm.  He says that he has remained fairly active but does have progressive exertional fatigue and shortness of breath.  He has had some dizziness and feeling like he might pass out.  He has also had shortness of breath laying down at night and has been sleeping sitting up in a chair at times.  He denies chest discomfort.  He has had right foot drop for many years due to spinal disease that was subsequently operated on.  He wears a right foot drop splint and uses a cane.  Past Medical History:  Diagnosis Date  . BCC (basal cell carcinoma of skin) 08/13/2010   V of neck- (tx p bx)  . BCC (basal cell carcinoma of skin) 12/11/2014   v of neck (CX35FU)  . BCC (basal cell carcinoma of skin) 04/07/1989   left cheek  . Heart murmur    Per patient, echocardiogram done was told nothing to be concerned about  . History of kidney stones   . Left hip pain   . Lumbago   . SCC (squamous cell carcinoma) 07/20/2011   left forearm (CX35FU)  . SCC (squamous cell carcinoma) 10/05/2011   Left ear (CX35FU)  . SCC (squamous cell carcinoma) 08/22/2018   left jawline (CX35FU)  . Superficial basal cell carcinoma (BCC) 07/20/2011   tip of nose (CX35FU)    Past Surgical History:  Procedure Laterality Date  . BACK SURGERY  2015  . RIGHT/LEFT HEART CATH AND CORONARY ANGIOGRAPHY N/A 03/27/2020   Procedure: RIGHT/LEFT HEART CATH AND CORONARY ANGIOGRAPHY;  Surgeon: Burnell Blanks, MD;  Location: [place];  Service: Cardiovascular;  Laterality: N/A;  . TOTAL HIP ARTHROPLASTY Left 05/29/2019   Procedure: TOTAL HIP ARTHROPLASTY ANTERIOR APPROACH;  Surgeon: Paralee Cancel, MD;  Location: [facility];  Service: Orthopedics;  Laterality: Left;  70 mins    Family History  Problem Relation Age of Onset  . Heart disease Mother        enlarged heart  . Diabetes Mother   . Heart attack  Father   . Cancer Sister   . Cancer Brother   . Colon cancer Neg Hx     Social History   Socioeconomic History  . Marital status: Married    Spouse name: Not on file  . Number of children: 3  . Years of education: Not on file  . Highest education level: Not on file  Occupational History  . Occupation: Retired Armed forces logistics/support/administrative officer.  Tobacco Use  . Smoking status: Former Smoker    Quit date: 1957    Years since quitting: 64.4  . Smokeless tobacco: Never Used  Substance and Sexual Activity  . Alcohol use: Yes    Comment: occasional wine with a meal  . Drug use: Never  . Sexual activity: Not on file  Other Topics Concern  . Not on file  Social History Narrative   3 grown children.       Patient has limited literacy.    Social Determinants of Health   Financial Resource Strain:   . Difficulty of Paying Living Expenses:   Food Insecurity:   . Worried About Charity fundraiser in the Last Year:   . Arboriculturist in the Last Year:   Transportation Needs:   . Film/video editor (Medical):   [name] Lack of Transportation (Non-Medical):   Physical Activity:   . Days of Exercise per Week:   . Minutes of Exercise per Session:   Stress:   . Feeling of Stress :   Social Connections:   . Frequency of Communication with Friends and Family:   . Frequency of Social Gatherings with Friends and Family:   . Attends Religious Services:   . Active Member of Clubs or Organizations:   . Attends Archivist Meetings:   [name] Marital Status:   Intimate Partner Violence:   . Fear of Current or Ex-Partner:   . Emotionally Abused:   [name] Physically Abused:   . Sexually Abused:     Current Outpatient Medications  Medication Sig Dispense Refill  . acetaminophen (TYLENOL) 500 MG tablet Take 2 tablets (1,000 mg total) by mouth every 8 (eight) hours. (Patient taking differently: Take 1,000 mg by mouth every 6 (six) hours as needed for mild pain or moderate pain. ) 30 tablet 0  . furosemide  (LASIX) 20 MG tablet Take 1 tablet (20 mg total) by mouth daily. 90 tablet 3  . metoprolol tartrate (LOPRESSOR) 50 MG tablet Take as directed prior to CT scan on 04/01/20 1 tablet 0  . Sennosides (EX-LAX) 15 MG CHEW Chew 15 mg by mouth daily as needed (constipation). Ex-lax     No current facility-administered medications for this visit.    Allergies  Allergen Reactions  . Penicillins     Did it involve swelling of the face/tongue/throat, SOB, or low BP? No Did it involve sudden or severe rash/hives, skin peeling,  or any reaction on the inside of your mouth or nose? Yes Did you need to seek medical attention at a hospital or doctor's office? No When did it last happen?childhood If all above answers are "NO", may proceed with cephalosporin use.  . Ibuprofen Rash      Review of Systems:   General:  Normal appetite, + decreased energy, no weight gain, no weight loss, no fever  Cardiac:  no chest pain with exertion, no chest pain at rest, +SOB with  exertion, no resting SOB, no PND, + orthopnea, no palpitations, no arrhythmia, no atrial fibrillation, + LE edema, + dizzy spells, no syncope  Respiratory:  + exertional shortness of breath, no home oxygen, no productive cough, no dry cough, no bronchitis, no wheezing, no hemoptysis, no asthma, no pain with inspiration or cough, no sleep apnea, no CPAP at night  GI:   no difficulty swallowing, no reflux, no frequent heartburn, no hiatal hernia, no abdominal pain, + constipation, no diarrhea, no hematochezia, no hematemesis, no melena  GU:   no dysuria,  + frequency, no urinary tract infection, no hematuria, no enlarged prostate, + kidney stones, no kidney disease  Vascular:  bi pain suggestive of claudication, bi pain in feet, + leg cramps, bi varicose veins, bi DVT, bi non-healing foot ulcer  Neuro:   no stroke, no TIA's, no seizures, no headaches, no temporary blindness one eye,  no slurred speech, + peripheral neuropathy, no chronic pain,  + instability of gait due to right foot drop, no memory/cognitive dysfunction  Musculoskeletal: no arthritis, no joint swelling, no myalgias, + difficulty walking, + reduced mobility   Skin:   no rash, no itching, no skin infections, no pressure sores or ulcerations  Psych:   no anxiety, no depression, no nervousness, no unusual recent stress  Eyes:   no blurry vision, no floaters, no recent vision changes, + wears glasses or contacts  ENT:   + hearing loss, no loose or painful teeth, + dentures  Hematologic:  bi easy bruising, bi abnormal bleeding, bi clotting disorder, bi frequent epistaxis  Endocrine:  bi diabetes, does not check CBG's at home        Physical Exam:   BP 122/62   Pulse 60   Temp 98.1 F (36.7 C) (Skin)   Resp 20   Ht 5\' 7"  (1.702 m)   Wt 176 lb (79.8 kg)   SpO2 94% Comment: RA  BMI 27.57 kg/m   General:  Elderly, well-appearing, walks very slow and unsteady  HEENT:  Unremarkable, NCAT, PERLA, EOMI, edentulous  Neck:   no JVD, no bruits, no adenopathy   Chest:   Rales in lower lobe bilaterally  CV:   RRR, grade lll/VI crescendo/decrescendo murmur heard best at RSB,  no diastolic murmur  Abdomen:  soft, non-tender, no masses   Extremities:  warm, well-perfused, pulses palpable at ankle, no LE edema  Rectal/GU  Deferred  Neuro:   Grossly non-focal and symmetrical throughout  Skin:   Clean and dry, no rashes, no breakdown   Diagnostic Tests:  ECHOCARDIOGRAM REPORT       Patient Name:  Gianpaolo Meliton Rattan Date of Exam: 03/19/2020  Medical Rec #: [id number]    Height:    67.0 in  Accession #:  [id number]   Weight:    176.0 lb  Date of Birth: [birth year]    BSA:     1.915 m  Patient Age:  5 years    BP:  156/80 mmHg  Patient Gender: M        HR:      69 bpm.  Exam Location: [facility]   Procedure: 2D Echo, 3D Echo, Cardiac Doppler, Color Doppler and Strain  Analysis   Indications:  I35 Aortic  stenosis    History:    Patient has prior history of Echocardiogram examinations,  most         recent 10/29/2019. Risk Factors:Former Smoker.    Sonographer:  Jessee Avers, RDCS  Referring Phys: [id number] [place]    1. Left ventricular ejection fraction, by estimation, is 50 to 55%. The  left ventricle has low normal function. The left ventricle demonstrates  regional wall motion abnormalities (see scoring diagram/findings for  description). There is mild left  ventricular hypertrophy. Left ventricular diastolic parameters are  consistent with Grade I diastolic dysfunction (impaired relaxation). There  is moderate hypokinesis of the left ventricular, basal-mid inferior wall.  2. Right ventricular systolic function is normal. The right ventricular  size is normal. There is normal pulmonary artery systolic pressure.  3. Left atrial size was mildly dilated.  4. The mitral valve is abnormal. Trivial mitral valve regurgitation. Mild  mitral stenosis. The mean mitral valve gradient is 4.0 mmHg with average  heart rate of 66 bpm.  5. The aortic valve has an indeterminant number of cusps. Aortic valve  regurgitation is mild. Severe aortic valve stenosis. Aortic valve area, by  VTI measures 0.73 cm. Aortic valve mean gradient measures 44.5 mmHg.  Aortic valve Vmax measures 4.22 m/s.  6. The inferior vena cava is normal in size with greater than 50%  respiratory variability, suggesting right atrial pressure of 3 mmHg.   Comparison(s): Report only 10/29/19 EF 60-65%. Severe AS 60 mmHg peak PG,  42 mmHg mean PG. The decline in gradient may reflect decreased cardiac  output.   FINDINGS  Left Ventricle: Left ventricular ejection fraction, by estimation, is 50  to 55%. The left ventricle has low normal function. The left ventricle  demonstrates regional wall motion abnormalities. Moderate hypokinesis of  the left ventricular, basal-mid    inferior wall. The left ventricular internal cavity size was normal in  size. There is mild left ventricular hypertrophy. Left ventricular  diastolic parameters are consistent with Grade I diastolic dysfunction  (impaired relaxation). Indeterminate filling  pressures.   Right Ventricle: The right ventricular size is normal. No increase in  right ventricular wall thickness. Right ventricular systolic function is  normal. There is normal pulmonary artery systolic pressure. The tricuspid  regurgitant velocity is 2.62 m/s, and  with an assumed right atrial pressure of 3 mmHg, the estimated right  ventricular systolic pressure is [id number] mmHg.   Left Atrium: Left atrial size was mildly dilated.   Right Atrium: Right atrial size was normal in size.   Pericardium: There is no evidence of pericardial effusion.   Mitral Valve: The mitral valve is abnormal. Mild to moderate mitral  annular calcification. Trivial mitral valve regurgitation. Mild mitral  valve stenosis. MV peak gradient, 11.7 mmHg. The mean mitral valve  gradient is 4.0 mmHg with average heart rate of  66 bpm.   Tricuspid Valve: The tricuspid valve is grossly normal. Tricuspid valve  regurgitation is mild.   Aortic Valve: The aortic valve has an indeterminant number of cusps.  Aortic valve regurgitation is mild. Aortic regurgitation PHT measures 389  msec. Severe aortic stenosis is present. There is severe calcifcation of  the  aortic valve. Aortic valve mean  gradient measures 44.5 mmHg. Aortic valve peak gradient measures 71.1  mmHg. Aortic valve area, by VTI measures 0.73 cm.   Pulmonic Valve: The pulmonic valve was normal in structure. Pulmonic valve  regurgitation is not visualized.   Aorta: The aortic root and ascending aorta are structurally normal, with  no evidence of dilitation.   Venous: The inferior vena cava is normal in size with greater than 50%  respiratory variability, suggesting right atrial pressure  of 3 mmHg.   IAS/Shunts: No atrial level shunt detected by color flow Doppler.     LEFT VENTRICLE  PLAX 2D  LVIDd:     5.10 cm Diastology  LVIDs:     3.50 cm LV e' lateral:  7.15 cm/s  LV PW:     1.10 cm LV E/e' lateral: 12.4  LV IVS:    1.40 cm LV e' medial:  3.16 cm/s  LVOT diam:   2.00 cm LV E/e' medial: 28.0  LV SV:     86  LV SV Index:  45    2D Longitudinal Strain  LVOT Area:   3.14 cm 2D Strain GLS (A2C):  -19.3 %             2D Strain GLS (A3C):  -17.2 %             2D Strain GLS (A4C):  -16.6 %             2D Strain GLS Avg:   -17.7 %               3D Volume EF:             3D EF:    46 %             LV EDV:    201 ml             LV ESV:    110 ml             LV SV:    92 ml   RIGHT VENTRICLE  RV Basal diam: 4.00 cm  RV S prime:   11.50 cm/s  TAPSE (M-mode): 2.5 cm  RVSP:      30.5 mmHg   LEFT ATRIUM       Index    RIGHT ATRIUM      Index  LA diam:    4.40 cm 2.30 cm/m RA Pressure: 3.00 mmHg  LA Vol (A2C):  69.5 ml 36.29 ml/m RA Area:   11.00 cm  LA Vol (A4C):  63.4 ml 33.10 ml/m RA Volume:  18.20 ml 9.50 ml/m  LA Biplane Vol: 73.2 ml 38.22 ml/m  AORTIC VALVE  AV Area (Vmax):  0.80 cm  AV Area (Vmean):  0.75 cm  AV Area (VTI):   0.73 cm  AV Vmax:      421.50 cm/s  AV Vmean:     305.200 cm/s  AV VTI:      1.170 m  AV Peak Grad:   71.1 mmHg  AV Mean Grad:   44.5 mmHg  LVOT Vmax:     107.00 cm/s  LVOT Vmean:    72.700 cm/s  LVOT VTI:     0.273 m  LVOT/AV VTI ratio: 0.23  AI PHT:      389 msec    AORTA  Ao Root diam: 3.70 cm  Ao Asc diam: 3.60 cm   MITRAL VALVE  TRICUSPID VALVE  MV Area (PHT): 4.68 cm   TR Peak grad:  27.5 mmHg  MV Peak grad: 11.7 mmHg  TR Vmax:    262.00 cm/s  MV  Mean grad: 4.0 mmHg   Estimated RAP: 3.00 mmHg  MV Vmax:    1.71 m/s   RVSP:      30.5 mmHg  MV Vmean:   90.3 cm/s  MV Decel Time: 162 msec   SHUNTS  MV E velocity: 88.60 cm/s  Systemic VTI: 0.27 m  MV A velocity: 138.00 cm/s Systemic Diam: 2.00 cm  MV E/A ratio: 0.64   Lyman Bishop MD  Electronically signed by Lyman Bishop MD  Signature Date/Time: 03/19/2020/5:45:12 PM     Physicians Panel Physicians Referring Physician Case Authorizing Physician  Burnell Blanks, MD (Primary)       Procedures RIGHT/LEFT HEART CATH AND CORONARY ANGIOGRAPHY     Conclusion 1st Mrg lesion is 50% stenosed.  Ost Cx to Prox Cx lesion is 20% stenosed.  Prox LAD lesion is 30% stenosed.  1st Diag lesion is 60% stenosed.  2nd Diag lesion is 60% stenosed.  Mid LAD lesion is 60% stenosed.  Prox RCA lesion is 70% stenosed. 1. Moderate non-obstructive disease in the mid LAD, second Diagonal branch and first obtuse marginal branch.  2. Moderately severe stenosis in the small to moderate caliber, non-dominant RCA  3. Severe aortic stenosis by echo. I did not cross the aortic valve today.  Recommendations: Will continue workup for TAVR. Medical management of non-obstructive CAD.      Recommendations Antiplatelet/Anticoag Will continue workup for TAVR. Medical management of non-obstructive CAD.        Indications Severe aortic stenosis [I35.0 (ICD-10-CM)]     Procedural Details Technical Details Indication: Severe aortic stenosis. TAVR workup.   Procedure: The risks, benefits, complications, treatment options, and expected outcomes were discussed with the patient. The patient and/or family concurred with the proposed plan, giving informed consent. The patient was brought to the cath lab after IV hydration was given. The patient was sedated with Versed and Fentanyl. The 18 gauge IV catheter present in the right antecubital vein was changed for a 5 [place] sheath. Right  heart catheterization performed with a balloon tipped catheter. The right wrist was prepped and draped in a sterile fashion. 1% lidocaine was used for local anesthesia. Using the modified Seldinger access technique, a 5 French sheath was placed in the right radial artery. 3 mg Verapamil was given through the sheath. 4000 units IV heparin was given. I could not engage the RCA or the left main due to severe tortuosity in the right innominate artery. The right groin was prepped and draped. A 5 French sheath was placed in the right femoral artery. Standard diagnostic catheters were used to perform selective coronary angiography. The RCA was engaged with a [place] catheter and the left main was engaged with a EBU 4.0 guiding catheter. I did not cross the aortic valve. A Mynx closure device was placed in the right femoral artery. The sheath was removed from the right radial artery and a Terumo hemostasis band was applied at the arteriotomy site on the right wrist.    Estimated blood loss <50 mL.   During this procedure medications were administered to achieve and maintain moderate conscious sedation while the patient's heart rate, blood pressure, and oxygen saturation were continuously monitored and I was present face-to-face 100% of this time.     Medications (Filter: Administrations occurring from 03/27/20 1531 to  03/27/20 1646)  Continuous medications are totaled by the amount administered until 03/27/20 1646.  Heparin (Porcine) in NaCl 1000-0.9 UT/500ML-% SOLN (mL) Total volume: 1,000 mL  Date/Time   Rate/Dose/Volume Action  03/27/20 1556  500 mL Given  1557  500 mL Given  fentaNYL (SUBLIMAZE) injection (mcg) Total dose: 25 mcg  Date/Time   Rate/Dose/Volume Action  03/27/20 1533  25 mcg Given  midazolam (VERSED) injection (mg) Total dose: 1 mg  Date/Time   Rate/Dose/Volume Action  03/27/20 1533  1 mg Given  lidocaine (PF) (XYLOCAINE) 1 % injection (mL) Total volume: 14 mL  Date/Time    Rate/Dose/Volume Action  03/27/20 1539  2 mL Given  1539  2 mL Canceled Entry  1548  2 mL Given  1600  10 mL Given  Radial Cocktail/Verapamil only (mL) Total volume: 10 mL  Date/Time   Rate/Dose/Volume Action  03/27/20 1549  10 mL Given  heparin sodium (porcine) injection (Units) Total dose: 4,000 Units  Date/Time   Rate/Dose/Volume Action  03/27/20 1550  4,000 Units Given  iohexol (OMNIPAQUE) 350 MG/ML injection (mL) Total volume: 130 mL  Date/Time   Rate/Dose/Volume Action  03/27/20 1635  130 mL Given     Sedation Time Sedation Time Physician-1: 56 minutes 17 seconds        Contrast Medication Name Total Dose  iohexol (OMNIPAQUE) 350 MG/ML injection 130 mL     Radiation/Fluoro Fluoro time: 20.4 (min)  DAP: [id number] (mGycm2)  Cumulative Air Kerma: 0000000 (mGy)     Complications Complications documented before study signed (03/27/2020 4:46 PM)  RIGHT/LEFT HEART CATH AND CORONARY ANGIOGRAPHY  None Documented by Burnell Blanks, MD 03/27/2020 4:45 PM  Date Found: 03/27/2020  Time Range: Intraprocedure       Coronary Findings Diagnostic Dominance: Left  Left Anterior Descending  Vessel is large.  Prox LAD lesion 30% stenosed  Prox LAD lesion is 30% stenosed. The lesion is calcified.  Mid LAD lesion 60% stenosed  Mid LAD lesion is 60% stenosed. The lesion is calcified.   First Diagonal Branch  Vessel is small in size.  1st Diag lesion 60% stenosed  1st Diag lesion is 60% stenosed.   Second Diagonal Branch  Vessel is moderate in size.  2nd Diag lesion 60% stenosed  2nd Diag lesion is 60% stenosed. The lesion is calcified.   Left Circumflex  Ost Cx to Prox Cx lesion 20% stenosed  Ost Cx to Prox Cx lesion is 20% stenosed.   First Obtuse Marginal Branch  1st Mrg lesion 50% stenosed  1st Mrg lesion is 50% stenosed.   Right Coronary Artery  Vessel is moderate in size.  Prox RCA lesion 70% stenosed  Prox RCA lesion is 70% stenosed.   Intervention No interventions have been documented.                            Coronary Diagrams Diagnostic Dominance: Left  &&&&&  Intervention      Implants  Vascular Products  Closure Mynx Control 83f [id number] - Implanted  Inventory item: CLOSURE [facility] CONTROL 72F Model/Cat number: [id number]  Manufacturer: [place] Lot number: [id number]  Device identifier: [id number] Device identifier type: GS1  GUDID Information   Request status Successful     Brand name: MYNX CONTROL Version/Model: [id number]   Company name: [employer] safety info as of 03/27/20: MR Safe   Contains dry or latex rubber: No  GMDN P.T. name: Wound hydrogel dressing, non-antimicrobial     As of 03/27/2020    Status: Implanted          [employer]ng[employer] Images Link to Procedure Log  Show images for CARDIAC CATHETERIZATION Procedure Log     Images on Long Term Storage   Show images for [place], [place]         Hemo Data   Most Recent Value  Fick Cardiac Output 6.16 L/min  Fick Cardiac Output Index 3.27 (L/min)/BSA  RA A Wave 11 mmHg  RA V Wave 9 mmHg  RA Mean 8 mmHg  RV Systolic Pressure 31 mmHg  RV Diastolic Pressure 8 mmHg  RV EDP 11 mmHg  PA Systolic Pressure 34 mmHg  PA Diastolic Pressure 15 mmHg  PA Mean 22 mmHg  PW A Wave 19 mmHg  PW V Wave 18 mmHg  PW Mean 17 mmHg  AO Systolic Pressure A999333 mmHg  AO Diastolic Pressure 73 mmHg  AO Mean 97 mmHg  QP/QS 1  TPVR Index 6.74 HRUI  TSVR Index 29.7 HRUI  PVR SVR Ratio 0.06  TPVR/TSVR Ratio 0.23    ADDENDUM REPORT: 04/01/2020 14:24  ADDENDUM: Extracardiac findings were described separately under dictation for contemporaneously obtained CTA chest, abdomen and pelvis dated 04/01/2020. Please see that dictation for full description of relevant extracardiac findings.   Electronically Signed   By: Vinnie Langton M.D.   On: 04/01/2020 14:24   Addended by Etheleen Mayhew, MD on 04/01/2020 2:26  PM    Study Result  CLINICAL DATA:  Aortic Stenosis  EXAM: Cardiac TAVR CT  TECHNIQUE: The patient was scanned on a Siemens Force [id number] slice scanner. A 120 kV retrospective scan was triggered in the ascending thoracic aorta at 140 HU's. Gantry rotation speed was 250 msecs and collimation was .6 mm. No beta blockade or nitro were given. The 3D data set was reconstructed in 5% intervals of the R-R cycle. Systolic and diastolic phases were analyzed on a dedicated work station using MPR, MIP and VRT modes. The patient received 80 cc of contrast.  FINDINGS: Aortic Valve: Tri leaflet calcified with restricted leaflet motion  Aorta: Mild aortic root dilatation. Moderate calcific atherosclerosis Tortuous left subclavian  Sino-tubular Junction: 28 mm  Ascending Thoracic Aorta: 38 mm  Aortic Arch: 31 mm  Descending Thoracic Aorta: 27 mm  Sinus of Valsalva Measurements:  Non-coronary: 32.6 mm  Right - coronary: 31.1 mm  Left -   coronary: 32.9 mm  Coronary Artery Height above Annulus:  Left Main: 13.9 mm above annulus  Right Coronary: RCA 11.9 mm above annulus  Virtual Basal Annulus Measurements:  Maximum / Minimum Diameter: 24.5 mm x 23.3 mm  Perimeter: 75 mm c  Area: 451 mm 2  Coronary Arteries: Sufficient height above annulus for deployment  Optimum Fluoroscopic Angle for Delivery: LAO 15 Cranial 2 degrees  IMPRESSION: 1. Tri leaflet AV with annular area of 451 mm 2 suitable for a 26 mm Sapien 2 valve. Note there is bulky leaflet calcium and annular calcium at the base of the non coronary cusp extending into the base of the mitral leaflet that may increase risk of PVL and heart block  2. Mild Aortic root dilatation 3.8 cm Tortuous left subclavian probably not suitable for access point  3. Optimum angiographic angle for deployment LAO 15 cranial 2 degrees  4.  Coronary arteries sufficient height above annulus for  deployment  Jenkins Rouge  Electronically Signed: By: Eligha Bridegroom.D.  On: 04/01/2020 12:39      CLINICAL DATA:  86 year old male with history of severe aortic stenosis. Preprocedural study prior to potential transcatheter aortic valve replacement (TAVR) procedure.  EXAM: CT ANGIOGRAPHY CHEST, ABDOMEN AND PELVIS  TECHNIQUE: Non-contrast CT of the chest was initially obtained.  Multidetector CT imaging through the chest, abdomen and pelvis was performed using the standard protocol during bolus administration of intravenous contrast. Multiplanar reconstructed images and MIPs were obtained and reviewed to evaluate the vascular anatomy.  CONTRAST:  175mL OMNIPAQUE IOHEXOL 350 MG/ML SOLN  COMPARISON:  CT the abdomen and pelvis 06/01/2018.  FINDINGS: CTA CHEST FINDINGS  Cardiovascular: Heart size is normal. There is no significant pericardial fluid, thickening or pericardial calcification. There is aortic atherosclerosis, as well as atherosclerosis of the great vessels of the mediastinum and the coronary arteries, including calcified atherosclerotic plaque in the left main, left anterior descending, left circumflex and right coronary arteries. Ectasia of the ascending thoracic aorta (4.1 cm in diameter). Severe thickening and calcification of the aortic valve.  Mediastinum/Lymph Nodes: No pathologically enlarged mediastinal or hilar lymph nodes. Esophagus is unremarkable in appearance. No axillary lymphadenopathy.  Lungs/Pleura: 4 mm right lower lobe subpleural pulmonary nodule (axial image 63 of series 8). No other larger more suspicious appearing pulmonary nodules or masses are noted. No acute consolidative airspace disease. No pleural effusions.  Musculoskeletal/Soft Tissues: There are no aggressive appearing lytic or blastic lesions noted in the visualized portions of the skeleton.  CTA ABDOMEN AND PELVIS FINDINGS  Hepatobiliary: Subcentimeter  low-attenuation lesion in the central aspect of segment 8 of the liver, too small to characterize, but statistically likely to represent a tiny cyst. No other suspicious hepatic lesions. No intra or extrahepatic biliary ductal dilatation. Gallbladder is normal in appearance.  Pancreas: No pancreatic mass. No pancreatic ductal dilatation. No pancreatic or peripancreatic fluid collections or inflammatory changes.  Spleen: Unremarkable.  Adrenals/Urinary Tract: 2.2 cm low-attenuation lesion in the interpolar region of the left kidney, compatible with a simple cyst. Multiple other parapelvic lesions associated with the kidneys bilaterally (left greater than right), compatible with peripelvic cysts. Bilateral adrenal glands are normal in appearance. No hydroureteronephrosis. Urinary bladder is partially obscured by beam hardening artifact from the patient's left hip arthroplasty, but visualized portions are unremarkable in appearance.  Stomach/Bowel: Normal appearance of the stomach. No pathologic dilatation of small bowel or colon. Numerous colonic diverticulae are noted, particularly in the sigmoid colon, without surrounding inflammatory changes to suggest an acute diverticulitis at this time. Normal appendix.  Vascular/Lymphatic: Aortic atherosclerosis, with vascular findings and measurements pertinent to potential TAVR procedure, as detailed below. Aneurysmal dilatation of the common iliac arteries bilaterally (2.2 cm on the right and 2.3 cm on the left). No lymphadenopathy noted in the abdomen or pelvis.  Reproductive: Prostate gland and seminal vesicles are largely obscured by beam hardening artifact from the patient's left hip arthroplasty.  Other: No significant volume of ascites.  No pneumoperitoneum.  Musculoskeletal: Status post PLIF from L1 through S1. Status post left hip arthroplasty. There are no aggressive appearing lytic or blastic lesions noted in the  visualized portions of the skeleton.  VASCULAR MEASUREMENTS PERTINENT TO TAVR:  AORTA:  Minimal Aortic Diameter-16 x 16 mm  Severity of Aortic Calcification-moderate to severe  RIGHT PELVIS:  Right Common Iliac Artery -  Minimal Diameter-11.2 x 9.8 mm  Tortuosity-moderate  Calcification-moderate  Right External Iliac Artery -  Minimal Diameter-10.0 x 9.3 mm  Tortuosity-extreme  Calcification-none  Right Common Femoral Artery -  Minimal Diameter-10.5 x 9.9 mm  Tortuosity-mild  Calcification-moderate  LEFT PELVIS:  Left Common Iliac Artery -  Minimal Diameter-13.2 x 11.4 mm  Tortuosity-moderate  Calcification-moderate  Left External Iliac Artery -  Minimal Diameter-10.9 x 10.9 mm  Tortuosity-extreme  Calcification-minimal  Left Common Femoral Artery -  Minimal Diameter-11.6 x 10.6 mm  Tortuosity-mild  Calcification-none  Review of the MIP images confirms the above findings.  IMPRESSION: 1. Vascular findings and measurements pertinent to potential TAVR procedure, as detailed above. 2. Severe thickening calcification of the aortic valve, compatible with reported clinical history of severe aortic stenosis. 3. Aortic atherosclerosis, in addition to left main and 3 vessel coronary artery disease. In addition, there is ectasia of the ascending thoracic aorta (4.1 cm in diameter). Recommend annual imaging followup by CTA or MRA. This recommendation follows 2010 ACCF/AHA/AATS/ACR/ASA/SCA/SCAI/SIR/STS/SVM Guidelines for the Diagnosis and Management of Patients with Thoracic Aortic Disease. Circulation. 2010; 121ML:4928372. Aortic aneurysm NOS (ICD10-I71.9). 4. There is also aneurysmal dilatation of the common iliac arteries bilaterally. 5. 4 mm subpleural nodule in the right lower lobe, nonspecific, but statistically likely benign. No follow-up needed if patient is low-risk. Non-contrast chest CT can be considered  in 12 months if patient is high-risk. This recommendation follows the consensus statement: Guidelines for Management of Incidental Pulmonary Nodules Detected on CT Images: From the Fleischner Society 2017; Radiology 2017; 284:228-243. 6. Severe colonic diverticulosis without evidence of acute diverticulitis at this time. 7. Additional incidental findings, as above.   Electronically Signed   By: Vinnie Langton M.D.   On: 04/01/2020 14:22  STS score Procedure: Isolated AVR Risk of Mortality:2.131% Renal Failure:1.557% Permanent Stroke:1.392% Prolonged Ventilation:6.792% DSW Infection:0.116% Reoperation:4.277% Morbidity or Mortality:11.859% Short Length of Stay:35.297% Long Length of Stay:5.949%   Impression:  This 86 year old gentleman has stage D, severe, symptomatic aortic stenosis with New York Heart Association class III symptoms of exertional fatigue and shortness of breath as well as episodic dizziness and orthopnea consistent with chronic diastolic congestive heart failure. I have personally reviewed his 2D echocardiogram, cardiac catheterization, and CTA studies his echocardiogram shows a trileaflet aortic valve with heavy leaflet calcification and restricted mobility. The mean gradient is 45 mmHg consistent with severe aortic stenosis. Left ventricular systolic function is normal. He has mild mitral stenosis. Cardiac catheterization shows moderate nonobstructive disease that can be managed medically since the patient has no anginal symptoms. His coronary arteries are heavily calcified. I agree that aortic valve replacement is indicated in this patient for the relief of his progressive symptoms and to prevent left ventricular deterioration and worsening congestive heart failure. He is a low surgical risk patient although if I was going to do open surgical aortic valve replacement I would also bypass his coronary arteries. Given his advanced age and reduced mobility associated  with right foot drop and balance difficulties I think the transcatheter aortic valve replacement would be a reasonable alternative for him. His gated cardiac CTA shows anatomy suitable for transcatheter aortic valve replacement using a 26 mm sapient three valve. There is bulky calcification in the leaflets and in the annulus with some calcification extending down onto the anterior mitral leaflet that may increase the risk of perivalvular leak. He has baseline left bundle branch block morphology and would be at increased risk for needing a permanent pacemaker whether he has TAVR or open surgery.  The patient and his wife are counseled at length regarding treatment alternatives for management of severe symptomatic aortic stenosis. The risks and benefits of surgical intervention  has been discussed in detail. Long-term prognosis with medical therapy was discussed. Alternative approaches such as conventional surgical aortic valve replacement, transcatheter aortic valve replacement, and palliative medical therapy were compared and contrasted at length. This discussion was placed in the context of the patient's own specific clinical presentation and past medical history. All of their questions have been addressed.   The patient has been advised of a variety of complications that might develop including but not limited to risks of death, stroke, paravalvular leak, aortic dissection or other major vascular complications, aortic annulus rupture, device embolization, cardiac rupture or perforation, mitral regurgitation, acute myocardial infarction, arrhythmia, heart block or bradycardia requiring permanent pacemaker placement, congestive heart failure, respiratory failure, renal failure, pneumonia, infection, other late complications related to structural valve deterioration or migration, or other complications that might ultimately cause a temporary or permanent loss of functional independence or other long term  morbidity. The patient provides full informed consent for the procedure as described and all questions were answered.     Plan:  I will review his CTA studies with Dr. Angelena Form and tentatively plan to proceed with transfemoral TAVR on Tuesday, 04/08/2020.   I spent 45 minutes performing this consultation and > 50% of this time was spent face to face counseling and coordinating the care of this patient's severe symptomatic aortic stenosis.     Gaye Pollack, MD 04/01/2020 4:12 PM

## 2020-04-03 ENCOUNTER — Other Ambulatory Visit (HOSPITAL_COMMUNITY): Payer: Self-pay | Admitting: *Deleted

## 2020-04-03 ENCOUNTER — Other Ambulatory Visit: Payer: Self-pay

## 2020-04-03 DIAGNOSIS — I350 Nonrheumatic aortic (valve) stenosis: Secondary | ICD-10-CM

## 2020-04-04 ENCOUNTER — Other Ambulatory Visit (HOSPITAL_COMMUNITY): Payer: PPO

## 2020-04-04 ENCOUNTER — Other Ambulatory Visit: Payer: Self-pay

## 2020-04-04 ENCOUNTER — Ambulatory Visit: Payer: PPO | Attending: Cardiovascular Disease | Admitting: Physical Therapy

## 2020-04-04 ENCOUNTER — Encounter: Payer: Self-pay | Admitting: Physical Therapy

## 2020-04-04 DIAGNOSIS — R262 Difficulty in walking, not elsewhere classified: Secondary | ICD-10-CM | POA: Diagnosis not present

## 2020-04-04 NOTE — Therapy (Signed)
[place], [place], [address] Phone: [phone]   Fax:  [phone]  Physical Therapy Evaluation/Pre-TAVR  Patient Details  Name: Corey Aguilar MRN: [id number] Date of Birth: [birth year] Referring Provider (PT): Lauree Chandler, MD   Encounter Date: 04/04/2020  PT End of Session - 04/04/20 1259    Visit Number  1    PT Start Time  [id number]    PT Stop Time  [id number]    PT Time Calculation (min)  26 min    Activity Tolerance  Patient tolerated treatment well    Behavior During Therapy  [facility] for tasks assessed/performed       Past Medical History:  Diagnosis Date  . BCC (basal cell carcinoma of skin) 08/13/2010   V of neck- (tx p bx)  . BCC (basal cell carcinoma of skin) 12/11/2014   v of neck (CX35FU)  . BCC (basal cell carcinoma of skin) 04/07/1989   left cheek  . Heart murmur    Per patient, echocardiogram done was told nothing to be concerned about  . History of kidney stones   . Left hip pain   . Lumbago   . SCC (squamous cell carcinoma) 07/20/2011   left forearm (CX35FU)  . SCC (squamous cell carcinoma) 10/05/2011   Left ear (CX35FU)  . SCC (squamous cell carcinoma) 08/22/2018   left jawline (CX35FU)  . Superficial basal cell carcinoma (BCC) 07/20/2011   tip of nose (CX35FU)    Past Surgical History:  Procedure Laterality Date  . BACK SURGERY  2015  . RIGHT/LEFT HEART CATH AND CORONARY ANGIOGRAPHY N/A 03/27/2020   Procedure: RIGHT/LEFT HEART CATH AND CORONARY ANGIOGRAPHY;  Surgeon: Burnell Blanks, MD;  Location: [facility];  Service: Cardiovascular;  Laterality: N/A;  . TOTAL HIP ARTHROPLASTY Left 05/29/2019   Procedure: TOTAL HIP ARTHROPLASTY ANTERIOR APPROACH;  Surgeon: Paralee Cancel, MD;  Location: [facility];  Service: Orthopedics;  Laterality: Left;  70 mins    There were no vitals filed for this visit.   Subjective Assessment - 04/04/20 1231    Subjective  Pt  reports he is ok. Wife reports she noticed recent increase in fatigue and SOB. Since about March. Both knees and Right hip are bad but they don't really bother me.    Currently in Pain?  No/denies         [facility] PT Assessment - 04/04/20 0001      Assessment   Medical Diagnosis  severe aortic stenosis    Referring Provider (PT)  Lauree Chandler, MD    Onset Date/Surgical Date  --   March 2021   Hand Dominance  Right      Precautions   Precaution Comments  drop foot in Rt leg      Restrictions   Weight Bearing Restrictions  No      Balance Screen   Has the patient fallen in the past 6 months  No    Has the patient had a decrease in activity level because of a fear of falling?   No    Is the patient reluctant to leave their home because of a fear of falling?   No      Home Environment   Living Environment  Private residence    Living Arrangements  Spouse/significant other    Additional Comments  stairs in home      Prior Function   Level of [place]  Retired  Le[place]      Cognition   Overall Cognitive Status  --   wife assisted in subjective questions     Observation/Other Assessments   Focus on Therapeutic Outcomes (FOTO)   n/a TAVR      Sensation   Additional Comments  N/t in hands      ROM / Strength   AROM / PROM / Strength  AROM;Strength      Strength   Overall Strength Comments  gross 5/5, Rt DF 2/5    Strength Assessment Site  Hand    Right/Left hand  Right;Left    Right Hand Grip (lbs)  60    Left Hand Grip (lbs)  65       [facility] Pre-Surgical Assessment - 04/04/20 0001    5 Meter Walk Test- trial 1  6 sec    5 Meter Walk Test- trial 2  6 sec.     5 Meter Walk Test- trial 3  6 sec.    5 meter walk test average  6 sec    4 Stage Balance Test Position  3    Sit To Stand Test- trial 1  15 sec.    6 Minute Walk- Baseline  yes    BP (mmHg)  129/69    HR (bpm)  63    02 Sat (%RA)  95 %    Modified Borg  Scale for Dyspnea  0- Nothing at all    Perceived Rate of Exertion (Borg)  6-    6 Minute Walk Post Test  yes    BP (mmHg)  145/72    HR (bpm)  74    02 Sat (%RA)  94 %    Modified Borg Scale for Dyspnea  3- Moderate shortness of breath or breathing difficulty    Perceived Rate of Exertion (Borg)  6-    Aerobic Endurance Distance Walked  945    Endurance additional comments  31% limited compared to age-related normative values                Objective measurements completed on examination: See above findings.                           Plan - 04/04/20 1300    PT Frequency  One time visit       Clinical Impression Statement: Pt is a 86 yo M presenting to OP PT for evaluation prior to possible TAVR surgery due to severe aortic stenosis. Pt's wife reports onset of fatigue and SOB noticeable beginning around March ,2020. Symptoms are  limiting functional endurance. Pt presents with WFL ROM and strength, with the exception of Rt DF for which he wears an AFO,  and denies musculoskeletal pain.   .Pt ambulated a total of 945 feet in 6 minute walk and reported 3/10 SOB on modified scale for dyspena and 6/20 RPE on Borg's perceived exertion and pain scale at the end of the walk. During the 6 minute walk test, patient's HR increased to 103 BPM and O2 saturation decreased to 93%. Based on the Short Physical Performance Battery, patient has a frailty rating of 9/12 with </= 5/12 considered frail.   Visit Diagnosis: Difficulty in walking, not elsewhere classified     Problem List Patient Active Problem List   Diagnosis Date Noted  . Severe aortic stenosis   . Overweight (BMI 25.0-29.9) 05/30/2019  . S/P left THA, AA 05/29/2019  .  Rectal bleeding 07/28/2018  . Constipation 07/28/2018    Juli Odom C. Maida Widger PT, DPT 04/04/20 1:04 PM   [facility] [address] [place], [place], [address] Phone: [phone]    Fax:  [phone]  Name: Corey Aguilar MRN: [id number] Date of Birth: 1934-07-01

## 2020-04-10 NOTE — Progress Notes (Signed)
Your procedure is scheduled on Tuesday April 15, 2020.  Report to [facility] Main Entrance "A" at 10:15 A.M., and check in at the Admitting office.  Call this number if you have problems the morning of surgery: [phone]  Call [phone] if you have any questions prior to your surgery date Monday-Friday 8am-4pm   Remember: Do not eat or drink after midnight the night before your surgery   Per your surgeon's orders:  Continue taking all current medications without change through the day before surgery.  >>On the morning of surgery DO NOT take any medications  As of today, STOP taking any Aspirin containing products, Aleve, Naproxen, Ibuprofen, Motrin, Advil, Goody's, BC's, all herbal medications, fish oil, and all vitamins.    The Morning of Surgery  Do not wear jewelry  Do not wear lotions, powders, colognes, or deodorant Men may shave face and neck.  Do not bring valuables to the hospital.  [facility] is not responsible for any belongings or valuables.  If you are a smoker, DO NOT Smoke 24 hours prior to surgery  If you wear a CPAP at night please bring your mask the morning of surgery   Remember that you must have someone to transport you home after your surgery, and remain with you for 24 hours if you are discharged the same day.   Please bring cases for contacts, glasses, hearing aids, dentures or bridgework because it cannot be worn into surgery.    Leave your suitcase in the car.  After surgery it may be brought to your room.  For patients admitted to the hospital, discharge time will be determined by your treatment team.  Patients discharged the day of surgery will not be allowed to drive home.    Special instructions:   [place]- Preparing For Surgery  Before surgery, you can play an important role. Because skin is not sterile, your skin needs to be as free of germs as possible. You can reduce the number of germs on your skin by washing with CHG  (chlorahexidine gluconate) Soap before surgery.  CHG is an antiseptic cleaner which kills germs and bonds with the skin to continue killing germs even after washing.    Oral Hygiene is also important to reduce your risk of infection.  Remember - BRUSH YOUR TEETH THE MORNING OF SURGERY WITH YOUR REGULAR TOOTHPASTE  Please do not use if you have an allergy to CHG or antibacterial soaps. If your skin becomes reddened/irritated stop using the CHG.  Do not shave (including legs and underarms) for at least 48 hours prior to first CHG shower. It is OK to shave your face.  Please follow these instructions carefully.   1. Shower the NIGHT BEFORE SURGERY and the MORNING OF SURGERY with CHG Soap.   2. If you chose to wash your hair and body, wash as usual with your normal shampoo and body-wash/soap.  3. Rinse your hair and body thoroughly to remove the shampoo and soap.  4. Apply CHG directly to the skin (ONLY FROM THE NECK DOWN) and wash gently with a scrungie or a clean washcloth.   5. Do not use on open wounds or open sores. Avoid contact with your eyes, ears, mouth and genitals (private parts). Wash Face and genitals (private parts)  with your normal soap.   6. Wash thoroughly, paying special attention to the area where your surgery will be performed.  7. Thoroughly rinse your body with warm water from the neck down.  8. DO NOT shower/wash with your normal soap after using and rinsing off the CHG Soap.  9. Pat yourself dry with a CLEAN TOWEL.  10. Wear CLEAN PAJAMAS to bed the night before surgery  11. Place CLEAN SHEETS on your bed the night of your first shower and DO NOT SLEEP WITH PETS.  12. Wear comfortable clothes the morning of surgery.     Day of Surgery:  Please shower the morning of surgery with the CHG soap Do not apply any deodorants/lotions. Please wear clean clothes to the hospital/surgery center.   Remember to brush your teeth WITH YOUR REGULAR TOOTHPASTE.   Please  read over the following fact sheets that you were given.

## 2020-04-11 ENCOUNTER — Other Ambulatory Visit (HOSPITAL_COMMUNITY)
Admission: RE | Admit: 2020-04-11 | Discharge: 2020-04-11 | Disposition: A | Discharge status: Home / Self Care | Payer: PPO | Source: Ambulatory Visit | Attending: Cardiovascular Disease | Admitting: Cardiovascular Disease

## 2020-04-11 ENCOUNTER — Encounter (HOSPITAL_COMMUNITY)
Admission: RE | Admit: 2020-04-11 | Discharge: 2020-04-11 | Disposition: A | Discharge status: Home / Self Care | Payer: PPO | Source: Ambulatory Visit | Attending: Cardiovascular Disease | Admitting: Cardiovascular Disease

## 2020-04-11 ENCOUNTER — Encounter (HOSPITAL_COMMUNITY): Payer: Self-pay

## 2020-04-11 ENCOUNTER — Ambulatory Visit (HOSPITAL_COMMUNITY)
Admission: RE | Admit: 2020-04-11 | Discharge: 2020-04-11 | Disposition: A | Discharge status: Home / Self Care | Payer: PPO | Source: Ambulatory Visit | Attending: Cardiovascular Disease | Admitting: Cardiovascular Disease

## 2020-04-11 ENCOUNTER — Other Ambulatory Visit: Payer: Self-pay

## 2020-04-11 DIAGNOSIS — Z20822 Contact with and (suspected) exposure to covid-19: Secondary | ICD-10-CM | POA: Insufficient documentation

## 2020-04-11 DIAGNOSIS — I350 Nonrheumatic aortic (valve) stenosis: Secondary | ICD-10-CM | POA: Diagnosis not present

## 2020-04-11 DIAGNOSIS — Z01818 Encounter for other preprocedural examination: Secondary | ICD-10-CM | POA: Diagnosis not present

## 2020-04-11 DIAGNOSIS — Z87891 Personal history of nicotine dependence: Secondary | ICD-10-CM | POA: Insufficient documentation

## 2020-04-11 DIAGNOSIS — R011 Cardiac murmur, unspecified: Secondary | ICD-10-CM | POA: Diagnosis not present

## 2020-04-11 DIAGNOSIS — Z01812 Encounter for preprocedural laboratory examination: Secondary | ICD-10-CM | POA: Diagnosis not present

## 2020-04-11 HISTORY — DX: Unspecified osteoarthritis, unspecified site: M19.90

## 2020-04-11 LAB — COMPREHENSIVE METABOLIC PANEL
ALT: 11 U/L (ref 0–44)
AST: 19 U/L (ref 15–41)
Albumin: 3.7 g/dL (ref 3.5–5.0)
Alkaline Phosphatase: 78 U/L (ref 38–126)
Anion gap: 12 (ref 5–15)
BUN: 21 mg/dL (ref 8–23)
CO2: 21 mmol/L — ABNORMAL LOW (ref 22–32)
Calcium: 8.9 mg/dL (ref 8.9–10.3)
Chloride: 106 mmol/L (ref 98–111)
Creatinine, Ser: 0.82 mg/dL (ref 0.61–1.24)
GFR calc Af Amer: >60 mL/min (ref >60)
GFR calc non Af Amer: >60 mL/min (ref >60)
Glucose, Bld: 97 mg/dL (ref 70–99)
Potassium: 4.1 mmol/L (ref 3.5–5.1)
Sodium: 139 mmol/L (ref 135–145)
Total Bilirubin: 1.6 mg/dL — ABNORMAL HIGH (ref 0.3–1.2)
Total Protein: 6.8 g/dL (ref 6.5–8.1)

## 2020-04-11 LAB — BLOOD GAS, ARTERIAL
Acid-Base Excess: 0 mmol/L (ref 0.0–2.0)
Bicarbonate: 23.9 mmol/L (ref 20.0–28.0)
Drawn by: 421801
FIO2: 21
O2 Saturation: 98.2 %
Patient temperature: 37
pCO2 arterial: 36.6 mmHg (ref 32.0–48.0)
pH, Arterial: 7.429 (ref 7.350–7.450)
pO2, Arterial: 111 mmHg — ABNORMAL HIGH (ref 83.0–108.0)

## 2020-04-11 LAB — CBC
HCT: 42.8 % (ref 39.0–52.0)
Hemoglobin: 14 g/dL (ref 13.0–17.0)
MCH: 30.6 pg (ref 26.0–34.0)
MCHC: 32.7 g/dL (ref 30.0–36.0)
MCV: 93.4 fL (ref 80.0–100.0)
Platelets: 229 10*3/uL (ref 150–400)
RBC: 4.58 MIL/uL (ref 4.22–5.81)
RDW: 13.2 % (ref 11.5–15.5)
WBC: 7.3 10*3/uL (ref 4.0–10.5)
nRBC: 0 % (ref 0.0–0.2)

## 2020-04-11 LAB — BRAIN NATRIURETIC PEPTIDE: B Natriuretic Peptide: 208.2 pg/mL — ABNORMAL HIGH (ref 0.0–100.0)

## 2020-04-11 LAB — URINALYSIS, ROUTINE W REFLEX MICROSCOPIC
Bacteria, UA: NONE SEEN
Bilirubin Urine: NEGATIVE
Glucose, UA: NEGATIVE mg/dL
Ketones, ur: NEGATIVE mg/dL
Leukocytes,Ua: NEGATIVE
Nitrite: NEGATIVE
Protein, ur: NEGATIVE mg/dL
Specific Gravity, Urine: 1.019 (ref 1.005–1.030)
pH: 5 (ref 5.0–8.0)

## 2020-04-11 LAB — SURGICAL PCR SCREEN
MRSA, PCR: NEGATIVE
Staphylococcus aureus: NEGATIVE

## 2020-04-11 LAB — APTT: aPTT: 34 seconds (ref 24–36)

## 2020-04-11 LAB — PROTIME-INR
INR: 1.1 (ref 0.8–1.2)
Prothrombin Time: 13.9 seconds (ref 11.4–15.2)

## 2020-04-11 LAB — TYPE AND SCREEN
ABO/RH(D): O POS
Antibody Screen: NEGATIVE

## 2020-04-11 LAB — ABO/RH: ABO/RH(D): O POS

## 2020-04-11 LAB — HEMOGLOBIN A1C
Hgb A1c MFr Bld: 5.9 % — ABNORMAL HIGH (ref 4.8–5.6)
Mean Plasma Glucose: 122.63 mg/dL

## 2020-04-11 NOTE — Progress Notes (Signed)
PCP - Mardene Sayer, MD Cardiologist - Kate Sable, MD  PPM/ICD - Denies  Chest x-ray - 04/11/20 EKG - 03/27/20 Stress Test - Denies ECHO - 03/19/20 Cardiac Cath - 03/27/20  Sleep Study - Denies   Patient denies being a diabetic.  Blood Thinner Instructions: N/A Aspirin Instructions: N/A  ERAS Protcol - No PRE-SURGERY Ensure or G2- N/A  COVID TEST- 04/11/20 @ [facility]   Anesthesia review: Yes, cardiac hx.  Patient denies shortness of breath, fever, cough and chest pain at PAT appointment   All instructions explained to the patient, with a verbal understanding of the material. Patient agrees to go over the instructions while at home for a better understanding. Patient also instructed to self quarantine after being tested for COVID-19. The opportunity to ask questions was provided.

## 2020-04-12 LAB — SARS CORONAVIRUS 2 (TAT 6-24 HRS): SARS Coronavirus 2: NEGATIVE

## 2020-04-14 MED ORDER — VANCOMYCIN HCL 1250 MG/250ML IV SOLN
1250.0000 mg | INTRAVENOUS | Status: AC
  Administered 2020-04-15: 1250 mg via INTRAVENOUS
  Filled 2020-04-14 (×2): qty 250

## 2020-04-14 MED ORDER — MAGNESIUM SULFATE 50 % IJ SOLN
40.0000 meq | INTRAMUSCULAR | Status: DC
  Filled 2020-04-14: qty 9.85

## 2020-04-14 MED ORDER — NOREPINEPHRINE 4 MG/250ML-% IV SOLN
0.0000 ug/min | INTRAVENOUS | Status: DC
  Filled 2020-04-14: qty 250

## 2020-04-14 MED ORDER — SODIUM CHLORIDE 0.9 % IV SOLN
1.5000 g | INTRAVENOUS | Status: AC
  Administered 2020-04-15: 1.5 g via INTRAVENOUS
  Filled 2020-04-14 (×2): qty 1.5

## 2020-04-14 MED ORDER — POTASSIUM CHLORIDE 2 MEQ/ML IV SOLN
80.0000 meq | INTRAVENOUS | Status: DC
  Filled 2020-04-14: qty 40

## 2020-04-14 MED ORDER — DEXMEDETOMIDINE HCL IN NACL 400 MCG/100ML IV SOLN
0.1000 ug/kg/h | INTRAVENOUS | Status: AC
  Administered 2020-04-15: 1 ug/kg/h via INTRAVENOUS
  Filled 2020-04-14 (×2): qty 100

## 2020-04-14 MED ORDER — SODIUM CHLORIDE 0.9 % IV SOLN
INTRAVENOUS | Status: DC
  Filled 2020-04-14: qty 30

## 2020-04-15 ENCOUNTER — Inpatient Hospital Stay (HOSPITAL_COMMUNITY): Payer: PPO

## 2020-04-15 ENCOUNTER — Other Ambulatory Visit: Payer: Self-pay | Admitting: Physician Assistant

## 2020-04-15 ENCOUNTER — Inpatient Hospital Stay (HOSPITAL_COMMUNITY): Payer: PPO | Admitting: Anesthesiology

## 2020-04-15 ENCOUNTER — Encounter (HOSPITAL_COMMUNITY)
Admission: RE | Disposition: A | Discharge status: Home / Self Care | Payer: Self-pay | Source: Home / Self Care | Attending: Cardiovascular Disease

## 2020-04-15 ENCOUNTER — Ambulatory Visit (HOSPITAL_COMMUNITY): Payer: PPO

## 2020-04-15 ENCOUNTER — Inpatient Hospital Stay (HOSPITAL_COMMUNITY): Payer: PPO | Admitting: Physician Assistant

## 2020-04-15 ENCOUNTER — Other Ambulatory Visit: Payer: Self-pay

## 2020-04-15 ENCOUNTER — Inpatient Hospital Stay (HOSPITAL_COMMUNITY)
Admission: RE | Admit: 2020-04-15 | Discharge: 2020-04-17 | DRG: 266 | Disposition: A | Discharge status: Home / Self Care | Payer: PPO | Attending: Cardiovascular Disease | Admitting: Cardiovascular Disease

## 2020-04-15 ENCOUNTER — Encounter (HOSPITAL_COMMUNITY): Payer: Self-pay | Admitting: Cardiovascular Disease

## 2020-04-15 DIAGNOSIS — I517 Cardiomegaly: Secondary | ICD-10-CM | POA: Diagnosis not present

## 2020-04-15 DIAGNOSIS — I447 Left bundle-branch block, unspecified: Secondary | ICD-10-CM | POA: Diagnosis not present

## 2020-04-15 DIAGNOSIS — Z952 Presence of prosthetic heart valve: Secondary | ICD-10-CM | POA: Diagnosis not present

## 2020-04-15 DIAGNOSIS — Z85828 Personal history of other malignant neoplasm of skin: Secondary | ICD-10-CM | POA: Diagnosis not present

## 2020-04-15 DIAGNOSIS — I442 Atrioventricular block, complete: Secondary | ICD-10-CM | POA: Diagnosis not present

## 2020-04-15 DIAGNOSIS — R911 Solitary pulmonary nodule: Secondary | ICD-10-CM | POA: Diagnosis not present

## 2020-04-15 DIAGNOSIS — E663 Overweight: Secondary | ICD-10-CM | POA: Diagnosis present

## 2020-04-15 DIAGNOSIS — Z886 Allergy status to analgesic agent status: Secondary | ICD-10-CM | POA: Diagnosis not present

## 2020-04-15 DIAGNOSIS — I2510 Atherosclerotic heart disease of native coronary artery without angina pectoris: Secondary | ICD-10-CM | POA: Diagnosis present

## 2020-04-15 DIAGNOSIS — Z950 Presence of cardiac pacemaker: Secondary | ICD-10-CM | POA: Diagnosis present

## 2020-04-15 DIAGNOSIS — Z833 Family history of diabetes mellitus: Secondary | ICD-10-CM

## 2020-04-15 DIAGNOSIS — J9811 Atelectasis: Secondary | ICD-10-CM | POA: Diagnosis not present

## 2020-04-15 DIAGNOSIS — Z96642 Presence of left artificial hip joint: Secondary | ICD-10-CM | POA: Diagnosis not present

## 2020-04-15 DIAGNOSIS — Z87891 Personal history of nicotine dependence: Secondary | ICD-10-CM | POA: Diagnosis not present

## 2020-04-15 DIAGNOSIS — Z8249 Family history of ischemic heart disease and other diseases of the circulatory system: Secondary | ICD-10-CM

## 2020-04-15 DIAGNOSIS — I350 Nonrheumatic aortic (valve) stenosis: Secondary | ICD-10-CM | POA: Diagnosis not present

## 2020-04-15 DIAGNOSIS — Z87442 Personal history of urinary calculi: Secondary | ICD-10-CM | POA: Diagnosis not present

## 2020-04-15 DIAGNOSIS — Z809 Family history of malignant neoplasm, unspecified: Secondary | ICD-10-CM | POA: Diagnosis not present

## 2020-04-15 DIAGNOSIS — I080 Rheumatic disorders of both mitral and aortic valves: Secondary | ICD-10-CM | POA: Diagnosis not present

## 2020-04-15 DIAGNOSIS — Z880 Allergy status to penicillin: Secondary | ICD-10-CM | POA: Diagnosis not present

## 2020-04-15 DIAGNOSIS — I5033 Acute on chronic diastolic (congestive) heart failure: Secondary | ICD-10-CM | POA: Diagnosis not present

## 2020-04-15 DIAGNOSIS — M21371 Foot drop, right foot: Secondary | ICD-10-CM | POA: Diagnosis present

## 2020-04-15 DIAGNOSIS — Z006 Encounter for examination for normal comparison and control in clinical research program: Secondary | ICD-10-CM | POA: Diagnosis not present

## 2020-04-15 DIAGNOSIS — I700 Atherosclerosis of aorta: Secondary | ICD-10-CM | POA: Diagnosis not present

## 2020-04-15 DIAGNOSIS — Z95818 Presence of other cardiac implants and grafts: Secondary | ICD-10-CM

## 2020-04-15 DIAGNOSIS — K5900 Constipation, unspecified: Secondary | ICD-10-CM | POA: Diagnosis not present

## 2020-04-15 HISTORY — PX: TRANSCATHETER AORTIC VALVE REPLACEMENT, TRANSFEMORAL: SHX6400

## 2020-04-15 HISTORY — DX: Presence of prosthetic heart valve: Z95.2

## 2020-04-15 HISTORY — PX: TEE WITHOUT CARDIOVERSION: SHX5443

## 2020-04-15 LAB — POCT I-STAT, CHEM 8
BUN: 21 mg/dL (ref 8–23)
BUN: 20 mg/dL (ref 8–23)
BUN: 22 mg/dL (ref 8–23)
Calcium, Ion: 1.27 mmol/L (ref 1.15–1.40)
Calcium, Ion: 1.29 mmol/L (ref 1.15–1.40)
Calcium, Ion: 1.32 mmol/L (ref 1.15–1.40)
Chloride: 107 mmol/L (ref 98–111)
Chloride: 107 mmol/L (ref 98–111)
Chloride: 106 mmol/L (ref 98–111)
Creatinine, Ser: 0.8 mg/dL (ref 0.61–1.24)
Creatinine, Ser: 0.7 mg/dL (ref 0.61–1.24)
Creatinine, Ser: 0.7 mg/dL (ref 0.61–1.24)
Glucose, Bld: 101 mg/dL — ABNORMAL HIGH (ref 70–99)
Glucose, Bld: 122 mg/dL — ABNORMAL HIGH (ref 70–99)
Glucose, Bld: 127 mg/dL — ABNORMAL HIGH (ref 70–99)
HCT: 33 % — ABNORMAL LOW (ref 39.0–52.0)
HCT: 34 % — ABNORMAL LOW (ref 39.0–52.0)
HCT: 36 % — ABNORMAL LOW (ref 39.0–52.0)
Hemoglobin: 11.2 g/dL — ABNORMAL LOW (ref 13.0–17.0)
Hemoglobin: 11.6 g/dL — ABNORMAL LOW (ref 13.0–17.0)
Hemoglobin: 12.2 g/dL — ABNORMAL LOW (ref 13.0–17.0)
Potassium: 3.7 mmol/L (ref 3.5–5.1)
Potassium: 4.1 mmol/L (ref 3.5–5.1)
Potassium: 4.2 mmol/L (ref 3.5–5.1)
Sodium: 142 mmol/L (ref 135–145)
Sodium: 143 mmol/L (ref 135–145)
Sodium: 142 mmol/L (ref 135–145)
TCO2: 25 mmol/L (ref 22–32)
TCO2: 25 mmol/L (ref 22–32)
TCO2: 26 mmol/L (ref 22–32)

## 2020-04-15 LAB — POCT I-STAT 7, (LYTES, BLD GAS, ICA,H+H)
Acid-base deficit: 1 mmol/L (ref 0.0–2.0)
Bicarbonate: 24.9 mmol/L (ref 20.0–28.0)
Calcium, Ion: 1.25 mmol/L (ref 1.15–1.40)
HCT: 36 % — ABNORMAL LOW (ref 39.0–52.0)
Hemoglobin: 12.2 g/dL — ABNORMAL LOW (ref 13.0–17.0)
O2 Saturation: 98 %
Potassium: 3.7 mmol/L (ref 3.5–5.1)
Sodium: 144 mmol/L (ref 135–145)
TCO2: 26 mmol/L (ref 22–32)
pCO2 arterial: 46.5 mmHg (ref 32.0–48.0)
pH, Arterial: 7.337 — ABNORMAL LOW (ref 7.350–7.450)
pO2, Arterial: 110 mmHg — ABNORMAL HIGH (ref 83.0–108.0)

## 2020-04-15 LAB — POCT ACTIVATED CLOTTING TIME: Activated Clotting Time: 286 s

## 2020-04-15 SURGERY — IMPLANTATION, AORTIC VALVE, TRANSCATHETER, FEMORAL APPROACH
Anesthesia: Monitor Anesthesia Care

## 2020-04-15 MED ORDER — HEPARIN SODIUM (PORCINE) 1000 UNIT/ML IJ SOLN
INTRAMUSCULAR | Status: DC | PRN
Start: 2020-04-15 — End: 2020-04-15
  Administered 2020-04-15: 12000 [IU] via INTRAVENOUS

## 2020-04-15 MED ORDER — DIPHENHYDRAMINE HCL 50 MG/ML IJ SOLN
INTRAMUSCULAR | Status: AC
  Filled 2020-04-15: qty 1

## 2020-04-15 MED ORDER — LIDOCAINE HCL (PF) 1 % IJ SOLN
INTRAMUSCULAR | Status: DC | PRN
  Administered 2020-04-15 (×2): 10 mL via INTRADERMAL

## 2020-04-15 MED ORDER — CLOPIDOGREL BISULFATE 75 MG PO TABS
75.0000 mg | ORAL_TABLET | Freq: Every day | ORAL | Status: DC
  Administered 2020-04-16 – 2020-04-17 (×2): 75 mg via ORAL
  Filled 2020-04-15 (×2): qty 1

## 2020-04-15 MED ORDER — VANCOMYCIN HCL IN DEXTROSE 1-5 GM/200ML-% IV SOLN
1000.0000 mg | Freq: Once | INTRAVENOUS | Status: AC
  Administered 2020-04-16: 1000 mg via INTRAVENOUS
  Filled 2020-04-15: qty 200

## 2020-04-15 MED ORDER — ONDANSETRON HCL 4 MG/2ML IJ SOLN
INTRAMUSCULAR | Status: DC | PRN
  Administered 2020-04-15: 4 mg via INTRAVENOUS

## 2020-04-15 MED ORDER — ACETAMINOPHEN 325 MG PO TABS: 650.0000 mg | ORAL_TABLET | Freq: Four times a day (QID) | ORAL | Status: DC | PRN

## 2020-04-15 MED ORDER — HEPARIN (PORCINE) IN NACL 1000-0.9 UT/500ML-% IV SOLN
INTRAVENOUS | Status: AC
  Filled 2020-04-15: qty 1500

## 2020-04-15 MED ORDER — TRAMADOL HCL 50 MG PO TABS
50.0000 mg | ORAL_TABLET | ORAL | Status: DC | PRN
  Administered 2020-04-16: 50 mg via ORAL
  Filled 2020-04-15: qty 1

## 2020-04-15 MED ORDER — CHLORHEXIDINE GLUCONATE 4 % EX LIQD
60.0000 mL | Freq: Once | CUTANEOUS | Status: DC
  Filled 2020-04-15: qty 60

## 2020-04-15 MED ORDER — SODIUM CHLORIDE 0.9% FLUSH: 3.0000 mL | Freq: Two times a day (BID) | INTRAVENOUS | Status: DC

## 2020-04-15 MED ORDER — FENTANYL CITRATE (PF) 100 MCG/2ML IJ SOLN
INTRAMUSCULAR | Status: DC | PRN
  Administered 2020-04-15: 50 ug via INTRAVENOUS

## 2020-04-15 MED ORDER — ASPIRIN 81 MG PO CHEW
81.0000 mg | CHEWABLE_TABLET | Freq: Every day | ORAL | Status: DC
  Administered 2020-04-16 – 2020-04-17 (×2): 81 mg via ORAL
  Filled 2020-04-15 (×2): qty 1

## 2020-04-15 MED ORDER — IOHEXOL 350 MG/ML SOLN
INTRAVENOUS | Status: DC | PRN
  Administered 2020-04-15: 60 mL

## 2020-04-15 MED ORDER — SODIUM CHLORIDE 0.9% FLUSH: 3.0000 mL | INTRAVENOUS | Status: DC | PRN

## 2020-04-15 MED ORDER — PHENYLEPHRINE HCL-NACL 20-0.9 MG/250ML-% IV SOLN: 0.0000 ug/min | INTRAVENOUS | Status: DC

## 2020-04-15 MED ORDER — ONDANSETRON HCL 4 MG/2ML IJ SOLN: 4.0000 mg | Freq: Four times a day (QID) | INTRAMUSCULAR | Status: DC | PRN

## 2020-04-15 MED ORDER — SODIUM CHLORIDE 0.9 % IV SOLN: 250.0000 mL | INTRAVENOUS | Status: DC | PRN

## 2020-04-15 MED ORDER — LIDOCAINE HCL (PF) 1 % IJ SOLN
INTRAMUSCULAR | Status: AC
  Filled 2020-04-15: qty 30

## 2020-04-15 MED ORDER — LEVOFLOXACIN IN D5W 750 MG/150ML IV SOLN
750.0000 mg | INTRAVENOUS | Status: AC
  Administered 2020-04-16: 750 mg via INTRAVENOUS
  Filled 2020-04-15: qty 150

## 2020-04-15 MED ORDER — NITROGLYCERIN IN D5W 200-5 MCG/ML-% IV SOLN: 0.0000 ug/min | INTRAVENOUS | Status: DC

## 2020-04-15 MED ORDER — DIPHENHYDRAMINE HCL 50 MG/ML IJ SOLN
INTRAMUSCULAR | Status: DC | PRN
Start: 2020-04-15 — End: 2020-04-15
  Administered 2020-04-15: 12.5 mg via INTRAVENOUS

## 2020-04-15 MED ORDER — FENTANYL CITRATE (PF) 100 MCG/2ML IJ SOLN
INTRAMUSCULAR | Status: AC
  Filled 2020-04-15: qty 2

## 2020-04-15 MED ORDER — SODIUM CHLORIDE 0.9 % IV SOLN: INTRAVENOUS | Status: DC

## 2020-04-15 MED ORDER — HEPARIN (PORCINE) IN NACL 1000-0.9 UT/500ML-% IV SOLN
INTRAVENOUS | Status: DC | PRN
  Administered 2020-04-15 (×3): 500 mL

## 2020-04-15 MED ORDER — OXYCODONE HCL 5 MG PO TABS
5.0000 mg | ORAL_TABLET | ORAL | Status: DC | PRN
  Administered 2020-04-16: 5 mg via ORAL
  Filled 2020-04-15: qty 1

## 2020-04-15 MED ORDER — MORPHINE SULFATE (PF) 2 MG/ML IV SOLN: 1.0000 mg | INTRAVENOUS | Status: DC | PRN

## 2020-04-15 MED ORDER — SODIUM CHLORIDE 0.9 % IV SOLN
INTRAVENOUS | Status: AC | PRN
  Administered 2020-04-15: 10 mL/h via INTRAVENOUS

## 2020-04-15 MED ORDER — SODIUM CHLORIDE 0.9 % IV SOLN: INTRAVENOUS | Status: DC | PRN

## 2020-04-15 MED ORDER — GLYCOPYRROLATE 0.2 MG/ML IJ SOLN
INTRAMUSCULAR | Status: DC | PRN
Start: 2020-04-15 — End: 2020-04-15
  Administered 2020-04-15: .1 mg via INTRAVENOUS

## 2020-04-15 MED ORDER — PROTAMINE SULFATE 10 MG/ML IV SOLN
INTRAVENOUS | Status: DC | PRN
Start: 2020-04-15 — End: 2020-04-15
  Administered 2020-04-15: 120 mg via INTRAVENOUS

## 2020-04-15 MED ORDER — DEXMEDETOMIDINE HCL 200 MCG/2ML IV SOLN
INTRAVENOUS | Status: DC | PRN
  Administered 2020-04-15: 77.1 ug via INTRAVENOUS

## 2020-04-15 MED ORDER — IOHEXOL 350 MG/ML SOLN
INTRAVENOUS | Status: AC
  Filled 2020-04-15: qty 1

## 2020-04-15 MED ORDER — ACETAMINOPHEN 650 MG RE SUPP: 650.0000 mg | Freq: Four times a day (QID) | RECTAL | Status: DC | PRN

## 2020-04-15 MED ORDER — CHLORHEXIDINE GLUCONATE 4 % EX LIQD
30.0000 mL | CUTANEOUS | Status: DC
  Filled 2020-04-15: qty 30

## 2020-04-15 MED ORDER — CHLORHEXIDINE GLUCONATE 0.12 % MT SOLN
15.0000 mL | Freq: Once | OROMUCOSAL | Status: AC
  Administered 2020-04-15: 15 mL via OROMUCOSAL
  Filled 2020-04-15 (×2): qty 15

## 2020-04-15 MED ORDER — LACTATED RINGERS IV SOLN: INTRAVENOUS | Status: DC | PRN

## 2020-04-15 SURGICAL SUPPLY — 34 items
BAG SNAP BAND KOVER 36X36 (MISCELLANEOUS) ×6 IMPLANT
BLANKET WARM UNDERBOD FULL ACC (MISCELLANEOUS) ×5 IMPLANT
CABLE ADAPT PACING TEMP 12FT (ADAPTER) ×2 IMPLANT
CATH 26 ULTRA DELIVERY (CATHETERS) ×2 IMPLANT
CATH DIAG 6FR JR4 (CATHETERS) ×2 IMPLANT
CATH DIAG 6FR PIGTAIL ANGLED (CATHETERS) ×4 IMPLANT
CATH INFINITI 6F AL2 (CATHETERS) ×2 IMPLANT
CATH S G BIP PACING (CATHETERS) ×2 IMPLANT
CLOSURE MYNX CONTROL 6F/7F (Vascular Products) ×2 IMPLANT
CRIMPER (MISCELLANEOUS) ×2 IMPLANT
DEVICE CLOSURE PERCLS PRGLD 6F (VASCULAR PRODUCTS) IMPLANT
DEVICE INFLATION ATRION QL2530 (MISCELLANEOUS) ×2 IMPLANT
ELECT DEFIB PAD ADLT CADENCE (PAD) ×2 IMPLANT
GUIDEWIRE SAFE TJ AMPLATZ EXST (WIRE) ×2 IMPLANT
KIT HEART LEFT (KITS) ×3 IMPLANT
KIT MICROPUNCTURE NIT STIFF (SHEATH) ×2 IMPLANT
PACK CARDIAC CATHETERIZATION (CUSTOM PROCEDURE TRAY) ×3 IMPLANT
PERCLOSE PROGLIDE 6F (VASCULAR PRODUCTS) ×6
SHEATH 14X36 EDWARDS (SHEATH) ×2 IMPLANT
SHEATH BRITE TIP 7FR 35CM (SHEATH) ×2 IMPLANT
SHEATH PINNACLE 6F 10CM (SHEATH) ×2 IMPLANT
SHEATH PINNACLE 8F 10CM (SHEATH) ×2 IMPLANT
SHEATH PROBE COVER 6X72 (BAG) ×2 IMPLANT
SLEEVE REPOSITIONING LENGTH 30 (MISCELLANEOUS) ×2 IMPLANT
STOPCOCK MORSE 400PSI 3WAY (MISCELLANEOUS) ×6 IMPLANT
TRANSDUCER W/STOPCOCK (MISCELLANEOUS) ×6 IMPLANT
TUBE CONN 8.8X1320 FR HP M-F (CONNECTOR) ×2 IMPLANT
VALVE 26 ULTRA SAPIEN KIT (Valve) ×2 IMPLANT
WIRE AMPLATZ SS-J .035X180CM (WIRE) ×2 IMPLANT
WIRE EMERALD 3MM-J .035X150CM (WIRE) ×4 IMPLANT
WIRE EMERALD 3MM-J .035X260CM (WIRE) ×2 IMPLANT
WIRE EMERALD ST .035X260CM (WIRE) ×2 IMPLANT
WIRE HI TORQ VERSACORE-J 145CM (WIRE) ×2 IMPLANT
WIRE STIFF LUNDERQUIST 260CM (WIRE) ×2 IMPLANT

## 2020-04-15 NOTE — CV Procedure (Signed)
[facility]  TAVR OPERATIVE NOTE   Date of Procedure:  04/15/2020  Preoperative Diagnosis: Severe Aortic Stenosis   Postoperative Diagnosis: Same   Procedure:    Transcatheter Aortic Valve Replacement - Transfemoral Approach  Edwards Sapien 3 THV (size 26 mm, model #[id number] , serial # [id number])   Co-Surgeons:  Christopher McAlhany, MD and Clarence H. Owen, MD   Anesthesiologist:  Joslin  Echocardiographer:  Nishan  Pre-operative Echo Findings:  Severe aortic stenosis  Normal left ventricular systolic function  Post-operative Echo Findings:  Trivial paravalvular leak  Normal left ventricular systolic function  BRIEF CLINICAL NOTE AND INDICATIONS FOR SURGERY  86 yo male with history of skin cancer, nephrolithiasis and aortic stenosis who is here today for TAVR. He is referred by Dr. Koneswaran. He was recently seen by Dr. Koneswaran in our [place] [facility] office. Echo from an outside facility in December 2020 showed normal LV systolic function with LVEF=60-65% with mild aortic insufficiency and severe aortic stenosis. His wife called into our office on 03/18/20 with report of the pt feeling dizzy, short of breath and having orthopnea. Echo 03/19/20 with severe AS. Cardiac cath with moderate non-obstructive disease.   During the course of the patient's preoperative work up they have been evaluated comprehensively by a multidisciplinary team of specialists coordinated through the [facility] in the [facility].  They have been demonstrated to suffer from symptomatic severe aortic stenosis as noted above. The patient has been counseled extensively as to the relative risks and benefits of all options for the treatment of severe aortic stenosis including long term medical therapy, conventional surgery for aortic valve replacement, and transcatheter aortic valve replacement.  The patient has been independently  evaluated by Dr. Owen with CT surgery and they are felt to be at high risk for conventional surgical aortic valve replacement. After review of the patient's preoperative diagnostic tests they are felt to be candidate for transcatheter aortic valve replacement using the transfemoral approach as an alternative to high risk conventional surgery.    Following the decision to proceed with transcatheter aortic valve replacement, a discussion has been held regarding what types of management strategies would be attempted intraoperatively in the event of life-threatening complications, including whether or not the patient would be considered a candidate for the use of cardiopulmonary bypass and/or conversion to open sternotomy for attempted surgical intervention.  The patient has been advised of a variety of complications that might develop peculiar to this approach including but not limited to risks of death, stroke, paravalvular leak, aortic dissection or other major vascular complications, aortic annulus rupture, device embolization, cardiac rupture or perforation, acute myocardial infarction, arrhythmia, heart block or bradycardia requiring permanent pacemaker placement, congestive heart failure, respiratory failure, renal failure, pneumonia, infection, other late complications related to structural valve deterioration or migration, or other complications that might ultimately cause a temporary or permanent loss of functional independence or other long term morbidity.  The patient provides full informed consent for the procedure as described and all questions were answered preoperatively.    DETAILS OF THE OPERATIVE PROCEDURE  PREPARATION:   The patient is brought to the operating room on the above mentioned date and central monitoring was established by the anesthesia team including placement of a radial arterial line. The patient is placed in the supine position on the operating table.  Intravenous antibiotics  are administered. Conscious sedation is used.   Baseline transthoracic echocardiogram was performed. The patient's   chest, abdomen, both groins, and both lower extremities are prepared and draped in a sterile manner. A time out procedure is performed.   PERIPHERAL ACCESS:   Using the modified Seldinger technique, femoral arterial and venous access were obtained with placement of 6 Fr sheaths on the left side using u/s guidance.  A pigtail diagnostic catheter was passed through the femoral arterial sheath under fluoroscopic guidance into the aortic root.  A temporary transvenous pacemaker catheter was passed through the femoral venous sheath under fluoroscopic guidance into the right ventricle.  The pacemaker was tested to ensure stable lead placement and pacemaker capture. Aortic root angiography was performed in order to determine the optimal angiographic angle for valve deployment.  TRANSFEMORAL ACCESS:  A micropuncture kit was used to gain access to the right femoral artery. Position confirmed with angiography. Pre-closure with double ProGlide closure devices. The patient was heparinized systemically and ACT verified > 250 seconds.    A 14 Fr transfemoral E-sheath was introduced into the right femoral artery after progressively dilating over an Amplatz superstiff wire. An AL-2 catheter was used to direct a straight-tip exchange length wire across the native aortic valve into the left ventricle. This was exchanged out for a pigtail catheter and position was confirmed in the LV apex. Simultaneous LV and Ao pressures were recorded.  The pigtail catheter was then exchanged for an Amplatz Extra-stiff wire in the LV apex.   TRANSCATHETER HEART VALVE DEPLOYMENT:  An Edwards Sapien 3 THV (size 26 mm) was prepared and crimped per manufacturer's guidelines, and the proper orientation of the valve is confirmed on the [employer] delivery system. The valve was advanced through the introducer sheath using  normal technique until in an appropriate position in the abdominal aorta beyond the sheath tip. The balloon was then retracted and using the fine-tuning wheel was centered on the valve. The valve was then advanced across the aortic arch using appropriate flexion of the catheter. The valve was carefully positioned across the aortic valve annulus. The Commander catheter was retracted using normal technique. Once final position of the valve has been confirmed by angiographic assessment, the valve is deployed while temporarily holding ventilation and during rapid ventricular pacing to maintain systolic blood pressure < 50 mmHg and pulse pressure < 10 mmHg. The balloon inflation is held for >3 seconds after reaching full deployment volume. Once the balloon has fully deflated the balloon is retracted into the ascending aorta and valve function is assessed using TTE. There is felt to be no paravalvular leak and no central aortic insufficiency.  The patient's hemodynamic recovery following valve deployment is good.  The deployment balloon and guidewire are both removed. Echo demostrated acceptable post-procedural gradients, stable mitral valve function, and no AI.   PROCEDURE COMPLETION:  The sheath was then removed and closure devices were completed. Protamine was administered once femoral arterial repair was complete. The temporary pacemaker, pigtail catheters and femoral sheaths were removed with a Mynx closure device placed in the left femoral artery. Manual pressure used for venous hemostasis.    The patient tolerated the procedure well and is transported to the surgical intensive care in stable condition. There were no immediate intraoperative complications. All sponge instrument and needle counts are verified correct at completion of the operation.   No blood products were administered during the operation.  The patient received a total of 60 mL of intravenous contrast during the procedure.  Lauree Chandler MD 04/15/2020 2:32 PM

## 2020-04-15 NOTE — Anesthesia Procedure Notes (Signed)
Arterial Line Insertion Start/End6/06/2020 11:20 AM, 04/15/2020 11:36 AM Performed by: Josephine Igo, CRNA, CRNA  Patient location: Pre-op. Preanesthetic checklist: patient identified, IV checked, risks and benefits discussed, surgical consent and monitors and equipment checked Lidocaine 1% used for infiltration Right, radial was placed Catheter size: 20 G Hand hygiene performed  and maximum sterile barriers used   Attempts: 1 Procedure performed without using ultrasound guided technique. Following insertion, dressing applied and Biopatch. Post procedure assessment: normal  Patient tolerated the procedure well with no immediate complications.

## 2020-04-15 NOTE — Progress Notes (Signed)
Left groin fv sheath w/0.9NS at 10cc/hr and temp pacing wire to pacer box, rate 50, V MA 10

## 2020-04-15 NOTE — Anesthesia Postprocedure Evaluation (Signed)
Anesthesia Post Note  Patient: ANNIE ROSEBOOM  Procedure(s) Performed: TRANSCATHETER AORTIC VALVE REPLACEMENT, TRANSFEMORAL (N/A ) TRANSESOPHAGEAL ECHOCARDIOGRAM (TEE) (N/A )     Patient location during evaluation: Cath Lab Anesthesia Type: MAC Level of consciousness: awake and alert and patient cooperative Pain management: pain level controlled Vital Signs Assessment: post-procedure vital signs reviewed and stable Respiratory status: spontaneous breathing, nonlabored ventilation, respiratory function stable and patient connected to nasal cannula oxygen Cardiovascular status: stable, blood pressure returned to baseline and bradycardic (Patient in complete heart block VVI pacing at 60 bpm) Postop Assessment: no apparent nausea or vomiting Anesthetic complications: no    Last Vitals:  Vitals:   04/15/20 1620 04/15/20 1630  BP: 103/70 117/66  Pulse: (!) 48 (!) 49  Resp: 16 13  Temp:    SpO2: 95% 99%    Last Pain:  Vitals:   04/15/20 1510  TempSrc: Temporal  PainSc: Asleep                 Earnest Mcgillis,Wyatte COKER

## 2020-04-15 NOTE — Transfer of Care (Signed)
Immediate Anesthesia Transfer of Care Note  Patient: Corey Aguilar  Procedure(s) Performed: TRANSCATHETER AORTIC VALVE REPLACEMENT, TRANSFEMORAL (N/A ) TRANSESOPHAGEAL ECHOCARDIOGRAM (TEE) (N/A )  Patient Location: PACU and Cath Lab  Anesthesia Type:MAC  Level of Consciousness: awake and drowsy  Airway & Oxygen Therapy: Patient Spontanous Breathing and Patient connected to nasal cannula oxygen  Post-op Assessment: Report given to RN and Post -op Vital signs reviewed and stable  Post vital signs: Reviewed and stable  Last Vitals:  Vitals Value Taken Time  BP 120/68 04/15/20 1444  Temp 36.6 C 04/15/20 1441  Pulse 49 04/15/20 1446  Resp 16 04/15/20 1446  SpO2 97 % 04/15/20 1446  Vitals shown include unvalidated device data.  Last Pain:  Vitals:   04/15/20 1441  TempSrc: Temporal  PainSc: Asleep         Complications: No apparent anesthesia complications

## 2020-04-15 NOTE — Op Note (Signed)
[facility]   MULTIDISCIPLINARY HEART VALVE TEAM   TAVR OPERATIVE NOTE   Date of Procedure:  04/15/2020  Preoperative Diagnosis: Severe Aortic Stenosis   Postoperative Diagnosis: Same   Procedure:    Transcatheter Aortic Valve Replacement - Percutaneous Right Transfemoral Approach  Edwards Sapien 3 Ultra THV (size 26 mm, model # 9750TFX, serial # [id number])   Co-Surgeons:  Lauree Chandler, MD and Valentina Gu. Roxy Manns, MD  Anesthesiologist:  Roberts Gaudy, MD  Echocardiographer:  Jenkins Rouge, MD  Pre-operative Echo Findings:  Severe aortic stenosis  Normal left ventricular systolic function  Post-operative Echo Findings:  Mild paravalvular leak  Normal left ventricular systolic function   BRIEF CLINICAL NOTE AND INDICATIONS FOR SURGERY  The patient is an 85 year old gentleman with a history of multiple skin cancers, kidney stones, left bundle branch block, and severe aortic stenosis who was evaluated by Dr. Angelena Aguilar in March 2021 for consideration of aortic valve replacement.  He had an echocardiogram in outside facility in December 2020 showing severe aortic stenosis and mild aortic insufficiency with normal left ventricular ejection fraction.  When he saw Dr. Angelena Aguilar he denied any symptoms and plans are made for follow-up echocardiogram in 3 months.  His wife called the office on 03/18/2020 reporting that he has been having more symptoms with dizziness and feeling like he might pass out.  She felt like he may be having some chest discomfort and shortness of breath when lying down.  He had a follow-up echocardiogram on 03/19/2020 which showed severe aortic stenosis with a mean gradient of 44.5 mmHg and a peak gradient of 71.1 mmHg.  Aortic valve area was 0.73 cm.  Aortic insufficiency was mild with a pressure half-time of 389 ms.  There was mild mitral stenosis with a peak gradient of 11.7 mmHg.  The mean gradient was 4 mmHg.  There is trivial mitral  regurgitation.  Left ventricular ejection fraction was 50 to 55%.  He subsequently underwent cardiac catheterization on 03/27/2020 which showed moderate nonobstructive disease in the mid LAD with about 60% stenosis.  There is about 60% ostial stenosis and a large second diagonal branch.  The first marginal branch had about 50% stenosis.  It was felt that this could be managed medically.  During the course of the patient's preoperative work up they have been evaluated comprehensively by a multidisciplinary team of specialists coordinated through the [facility] in the [facility].  They have been demonstrated to suffer from symptomatic severe aortic stenosis as noted above. The patient has been counseled extensively as to the relative risks and benefits of all options for the treatment of severe aortic stenosis including long term medical therapy, conventional surgery for aortic valve replacement, and transcatheter aortic valve replacement.  All questions have been answered, and the patient provides full informed consent for the operation as described.   DETAILS OF THE OPERATIVE PROCEDURE  PREPARATION:    The patient is brought to the operating room on the above mentioned date and appropriate monitoring was established by the anesthesia team. The patient is placed in the supine position on the operating table.  Intravenous antibiotics are administered. The patient is monitored closely throughout the procedure under conscious sedation.  Baseline transthoracic echocardiogram was performed. The patient's chest, abdomen, both groins, and both lower extremities are prepared and draped in a sterile manner. A time out procedure is performed.   PERIPHERAL ACCESS:    Using the modified Seldinger technique, femoral arterial  and venous access was obtained with placement of 6 Fr sheaths on the left side.  A pigtail diagnostic catheter was passed through the left  arterial sheath under fluoroscopic guidance into the aortic root.  A temporary transvenous pacemaker catheter was passed through the left femoral venous sheath under fluoroscopic guidance into the right ventricle.  The pacemaker was tested to ensure stable lead placement and pacemaker capture. Aortic root angiography was performed in order to determine the optimal angiographic angle for valve deployment.   TRANSFEMORAL ACCESS:   Percutaneous transfemoral access and sheath placement was performed using ultrasound guidance.  The right common femoral artery was cannulated using a micropuncture needle and appropriate location was verified using hand injection angiogram.  A pair of Abbott Perclose percutaneous closure devices were placed and a 6 French sheath replaced into the femoral artery.  The patient was heparinized systemically and ACT verified > 250 seconds.    A 14 Fr transfemoral E-sheath was introduced into the right common femoral artery after progressively dilating over an Lunderquist wire. An AL-2 catheter was used to direct a straight-tip exchange length wire across the native aortic valve into the left ventricle. This was exchanged out for a pigtail catheter and position was confirmed in the LV apex. Simultaneous LV and Ao pressures were recorded.  The pigtail catheter was exchanged for an Amplatz Extra-stiff wire in the LV apex.  Echocardiography was utilized to confirm appropriate wire position and no sign of entanglement in the mitral subvalvular apparatus.   TRANSCATHETER HEART VALVE DEPLOYMENT:   An Edwards Sapien 3 Ultra transcatheter heart valve (size 26 mm, model #9750TFX, serial #[id number]) was prepared and crimped per manufacturer's guidelines, and the proper orientation of the valve is confirmed on the [employer] delivery system. The valve was advanced through the introducer sheath using normal technique until in an appropriate position in the abdominal aorta beyond the sheath  tip. The balloon was then retracted and using the fine-tuning wheel was centered on the valve. The valve was then advanced across the aortic arch using appropriate flexion of the catheter. The valve was carefully positioned across the aortic valve annulus. The Commander catheter was retracted using normal technique. At this juncture the device and system jumped aortic and the entire system and guidewire retracted into the aorta.  The extra stiff guidewire removed and a long J-wire was passed through the delivery system while applying flex the J-wire crossed back into the ventricle.  The system was advanced until the nosecone was across the valve, after which time the J-wire was removed and replaced with the extra stiff delivery wire.  The entire system was then advanced and positioned across the valve.  Once final position of the valve has been confirmed by angiographic assessment, the valve is deployed while temporarily holding ventilation and during rapid ventricular pacing to maintain systolic blood pressure < 50 mmHg and pulse pressure < 10 mmHg. The balloon inflation is held for >3 seconds after reaching full deployment volume. Once the balloon has fully deflated the balloon is retracted into the ascending aorta and valve function is assessed using echocardiography. There is felt to be mild paravalvular leak and no central aortic insufficiency.  The patient's hemodynamic recovery following valve deployment is good although the patient remained in complete heart block with high junctional escape rhythm.  The deployment balloon and guidewire are both removed.    PROCEDURE COMPLETION:   The sheath was removed and femoral artery closure performed.  Protamine was administered once femoral  arterial repair was complete. The pigtail catheters and femoral sheaths were removed with manual pressure used for hemostasis.  A Mynx femoral closure device was utilized following removal of the diagnostic sheath in the left  femoral artery.  The temporary pacing wire was left in place.  The patient tolerated the procedure well and is transported to the surgical intensive care in stable condition. There were no immediate intraoperative complications. All sponge instrument and needle counts are verified correct at completion of the operation.   No blood products were administered during the operation.  The patient received a total of 60 mL of intravenous contrast during the procedure.   Rexene Alberts, MD 04/15/2020 2:07 PM

## 2020-04-15 NOTE — Anesthesia Procedure Notes (Signed)
Procedure Name: MAC Date/Time: 04/15/2020 12:16 PM Performed by: Inda Coke, CRNA Pre-anesthesia Checklist: Patient identified, Emergency Drugs available, Suction available, Timeout performed and Patient being monitored Patient Re-evaluated:Patient Re-evaluated prior to induction Oxygen Delivery Method: Simple face mask Induction Type: IV induction Dental Injury: Teeth and Oropharynx as per pre-operative assessment

## 2020-04-15 NOTE — Anesthesia Preprocedure Evaluation (Signed)
Anesthesia Evaluation  Patient identified by MRN, date of birth, ID band Patient awake    Reviewed: Allergy & Precautions, NPO status , Patient's Chart, lab work & pertinent test results  Airway Mallampati: II  TM Distance: >3 FB     Dental  (+) Edentulous Upper, Edentulous Lower   Pulmonary former smoker,    breath sounds clear to auscultation       Cardiovascular  Rhythm:Regular Rate:Normal + Systolic murmurs    Neuro/Psych    GI/Hepatic   Endo/Other    Renal/GU      Musculoskeletal   Abdominal   Peds  Hematology   Anesthesia Other Findings   Reproductive/Obstetrics                             Anesthesia Physical Anesthesia Plan  ASA: III  Anesthesia Plan: MAC   Post-op Pain Management:    Induction: Intravenous  PONV Risk Score and Plan: Ondansetron and Dexamethasone  Airway Management Planned: Natural Airway and Simple Face Mask  Additional Equipment: Arterial line  Intra-op Plan:   Post-operative Plan:   Informed Consent: I have reviewed the patients History and Physical, chart, labs and discussed the procedure including the risks, benefits and alternatives for the proposed anesthesia with the patient or authorized representative who has indicated his/her understanding and acceptance.       Plan Discussed with: CRNA and Anesthesiologist  Anesthesia Plan Comments:         Anesthesia Quick Evaluation

## 2020-04-15 NOTE — Interval H&P Note (Signed)
History and Physical Interval Note:  04/15/2020 10:28 AM  Corey Aguilar  has presented today for surgery, with the diagnosis of Severe Aortic Stenosis.  The various methods of treatment have been discussed with the patient and family. After consideration of risks, benefits and other options for treatment, the patient has consented to  Procedure(s): TRANSCATHETER AORTIC VALVE REPLACEMENT, TRANSFEMORAL (N/A) TRANSESOPHAGEAL ECHOCARDIOGRAM (TEE) (N/A) as a surgical intervention.  The patient's history has been reviewed, patient examined, no change in status, stable for surgery.  I have reviewed the patient's chart and labs.  Questions were answered to the patient's satisfaction.     Lauree Chandler

## 2020-04-16 ENCOUNTER — Inpatient Hospital Stay (HOSPITAL_COMMUNITY)
Admission: RE | Disposition: A | Discharge status: Home / Self Care | Payer: Self-pay | Source: Home / Self Care | Attending: Cardiovascular Disease

## 2020-04-16 ENCOUNTER — Inpatient Hospital Stay (HOSPITAL_COMMUNITY): Payer: PPO

## 2020-04-16 DIAGNOSIS — I350 Nonrheumatic aortic (valve) stenosis: Secondary | ICD-10-CM

## 2020-04-16 DIAGNOSIS — Z950 Presence of cardiac pacemaker: Secondary | ICD-10-CM

## 2020-04-16 DIAGNOSIS — Z952 Presence of prosthetic heart valve: Secondary | ICD-10-CM

## 2020-04-16 DIAGNOSIS — I442 Atrioventricular block, complete: Secondary | ICD-10-CM

## 2020-04-16 HISTORY — DX: Presence of cardiac pacemaker: Z95.0

## 2020-04-16 HISTORY — PX: PACEMAKER IMPLANT: EP1218

## 2020-04-16 LAB — CBC
HCT: 38.4 % — ABNORMAL LOW (ref 39.0–52.0)
Hemoglobin: 12.6 g/dL — ABNORMAL LOW (ref 13.0–17.0)
MCH: 30.4 pg (ref 26.0–34.0)
MCHC: 32.8 g/dL (ref 30.0–36.0)
MCV: 92.5 fL (ref 80.0–100.0)
Platelets: 144 10*3/uL — ABNORMAL LOW (ref 150–400)
RBC: 4.15 MIL/uL — ABNORMAL LOW (ref 4.22–5.81)
RDW: 13.2 % (ref 11.5–15.5)
WBC: 7.6 10*3/uL (ref 4.0–10.5)
nRBC: 0 % (ref 0.0–0.2)

## 2020-04-16 LAB — BASIC METABOLIC PANEL
Anion gap: 8 (ref 5–15)
BUN: 16 mg/dL (ref 8–23)
CO2: 22 mmol/L (ref 22–32)
Calcium: 8.3 mg/dL — ABNORMAL LOW (ref 8.9–10.3)
Chloride: 108 mmol/L (ref 98–111)
Creatinine, Ser: 0.78 mg/dL (ref 0.61–1.24)
GFR calc Af Amer: >60 mL/min (ref >60)
GFR calc non Af Amer: >60 mL/min (ref >60)
Glucose, Bld: 108 mg/dL — ABNORMAL HIGH (ref 70–99)
Potassium: 4 mmol/L (ref 3.5–5.1)
Sodium: 138 mmol/L (ref 135–145)

## 2020-04-16 LAB — POCT ACTIVATED CLOTTING TIME
Activated Clotting Time: 114 seconds
Activated Clotting Time: 114 seconds

## 2020-04-16 LAB — MAGNESIUM: Magnesium: 1.7 mg/dL (ref 1.7–2.4)

## 2020-04-16 SURGERY — PACEMAKER IMPLANT

## 2020-04-16 MED ORDER — SODIUM CHLORIDE 0.9 % IV SOLN: INTRAVENOUS | Status: DC

## 2020-04-16 MED ORDER — FENTANYL CITRATE (PF) 100 MCG/2ML IJ SOLN
INTRAMUSCULAR | Status: AC
  Filled 2020-04-16: qty 2

## 2020-04-16 MED ORDER — VANCOMYCIN HCL IN DEXTROSE 1-5 GM/200ML-% IV SOLN
1000.0000 mg | Freq: Two times a day (BID) | INTRAVENOUS | Status: AC
  Administered 2020-04-17: 1000 mg via INTRAVENOUS
  Filled 2020-04-16: qty 200

## 2020-04-16 MED ORDER — ACETAMINOPHEN 325 MG PO TABS: 325.0000 mg | ORAL_TABLET | ORAL | Status: DC | PRN

## 2020-04-16 MED ORDER — LIDOCAINE HCL (PF) 1 % IJ SOLN
INTRAMUSCULAR | Status: AC
  Filled 2020-04-16: qty 60

## 2020-04-16 MED ORDER — HEPARIN (PORCINE) IN NACL 1000-0.9 UT/500ML-% IV SOLN
INTRAVENOUS | Status: AC
  Filled 2020-04-16: qty 500

## 2020-04-16 MED ORDER — CHLORHEXIDINE GLUCONATE CLOTH 2 % EX PADS
6.0000 | MEDICATED_PAD | Freq: Every day | CUTANEOUS | Status: DC
  Administered 2020-04-16: 6 via TOPICAL

## 2020-04-16 MED ORDER — IOHEXOL 350 MG/ML SOLN
INTRAVENOUS | Status: DC | PRN
  Administered 2020-04-16: 15 mL via INTRAVENOUS

## 2020-04-16 MED ORDER — VANCOMYCIN HCL IN DEXTROSE 1-5 GM/200ML-% IV SOLN
INTRAVENOUS | Status: AC
  Filled 2020-04-16: qty 200

## 2020-04-16 MED ORDER — LIDOCAINE HCL (PF) 1 % IJ SOLN
INTRAMUSCULAR | Status: DC | PRN
  Administered 2020-04-16: 55 mL

## 2020-04-16 MED ORDER — SODIUM CHLORIDE 0.9 % IV SOLN
INTRAVENOUS | Status: DC
  Administered 2020-04-16: 250 mL via INTRAVENOUS

## 2020-04-16 MED ORDER — SODIUM CHLORIDE 0.9 % IV SOLN: 80.0000 mg | INTRAVENOUS | Status: DC

## 2020-04-16 MED ORDER — VANCOMYCIN HCL IN DEXTROSE 1-5 GM/200ML-% IV SOLN
1000.0000 mg | INTRAVENOUS | Status: AC
  Administered 2020-04-16: 1000 mg via INTRAVENOUS
  Filled 2020-04-16: qty 200

## 2020-04-16 MED ORDER — FENTANYL CITRATE (PF) 100 MCG/2ML IJ SOLN
INTRAMUSCULAR | Status: DC | PRN
  Administered 2020-04-16: 25 ug via INTRAVENOUS

## 2020-04-16 MED ORDER — SODIUM CHLORIDE 0.9 % IV SOLN
INTRAVENOUS | Status: AC
  Filled 2020-04-16: qty 2

## 2020-04-16 MED ORDER — ONDANSETRON HCL 4 MG/2ML IJ SOLN: 4.0000 mg | Freq: Four times a day (QID) | INTRAMUSCULAR | Status: DC | PRN

## 2020-04-16 MED ORDER — HEPARIN (PORCINE) IN NACL 1000-0.9 UT/500ML-% IV SOLN
INTRAVENOUS | Status: DC | PRN
  Administered 2020-04-16: 500 mL

## 2020-04-16 SURGICAL SUPPLY — 8 items
CABLE SURGICAL S-101-97-12 (CABLE) ×3 IMPLANT
LEAD TENDRIL MRI 52CM LPA1200M (Lead) ×2 IMPLANT
LEAD TENDRIL MRI 58CM LPA1200M (Lead) ×2 IMPLANT
PACEMAKER ASSURITY DR-RF (Pacemaker) ×2 IMPLANT
PAD PRO RADIOLUCENT 2001M-C (PAD) ×3 IMPLANT
SHEATH 8FR PRELUDE SNAP 13 (SHEATH) ×4 IMPLANT
TRAY PACEMAKER INSERTION (PACKS) ×3 IMPLANT
WIRE HI TORQ VERSACORE-J 145CM (WIRE) ×2 IMPLANT

## 2020-04-16 NOTE — Discharge Instructions (Signed)
ACTIVITY AND EXERCISE . Daily activity and exercise are an important part of your recovery. People recover at different rates depending on their general health and type of valve procedure. . Most people recovering from TAVR feel better relatively quickly  . No lifting, pushing, pulling more than 10 pounds (examples to avoid: groceries, vacuuming, gardening, golfing):             - For one week with a procedure through the groin.             - For six weeks for procedures through the chest wall or neck. NOTE: You will typically see one of our providers 7-14 days after your procedure to discuss [place] the above activities.      DRIVING . Do not drive until you are seen for follow up and cleared by a provider. Generally, we ask patient to not drive for 1 week after their procedure. . If you have been told by your doctor in the past that you may not drive, you must talk with him/her before you begin driving again.   DRESSING . Groin site: you may leave the clear dressing over the site for up to one week or until it falls off.   HYGIENE . If you had a femoral (leg) procedure, you may take a shower when you return home. After the shower, pat the site dry. Do NOT use powder, oils or lotions in your groin area until the site has completely healed. . If you had a chest procedure, you may shower when you return home unless specifically instructed not to by your discharging practitioner.             - DO NOT scrub incision; pat dry with a towel.             - DO NOT apply any lotions, oils, powders to the incision.             - No tub baths / swimming for at least 2 weeks. . If you notice any fevers, chills, increased pain, swelling, bleeding or pus, please contact your doctor.   ADDITIONAL INFORMATION . If you are going to have an upcoming dental procedure, please contact our office as you will require antibiotics ahead of time to prevent infection on your heart valve.    If you have any  questions or concerns you can call the structural heart phone during normal business hours 8am-4pm. If you have an urgent need after hours or weekends please call [phone] to talk to the on call provider for general cardiology. If you have an emergency that requires immediate attention, please call 911.    After TAVR Checklist  Check  Test Description   Follow up appointment in 1-2 weeks  You will see our structural heart physician assistant, Nell Range. Your incision sites will be checked and you will be cleared to drive and resume all normal activities if you are doing well.     1 month echo and follow up  You will have an echo to check on your new heart valve and be seen back in the office by Nell Range. Many times the echo is not read by your appointment time, but Joellen Jersey will call you later that day or the following day to report your results.   Follow up with your primary cardiologist You will need to be seen by your primary cardiologist in the following 3-6 months after your 1 month appointment in the valve  clinic. Often times your Plavix or Aspirin will be discontinued during this time, but this is decided on a case by case basis.    1 year echo and follow up You will have another echo to check on your heart valve after 1 year and be seen back in the office by Nell Range. This your last structural heart visit.   Bacterial endocarditis prophylaxis  You will have to take antibiotics for the rest of your life before all dental procedures (even teeth cleanings) to protect your heart valve. Antibiotics are also required before some surgeries. Please check with your cardiologist before scheduling any surgeries. Also, please make sure to tell us if you have a penicillin allergy as you will require an alternative antibiotic.     After Your Pacemaker   ACTIVITY . Do not lift your arm above shoulder height for 1 week after your procedure. After 7 days, you may progress as below.      Wednesday April 23, 2020  Thursday April 24, 2020 Friday April 25, 2020 Saturday April 26, 2020   . Do not lift, push, pull, or carry anything over 10 pounds with the affected arm until 6 weeks (Wednesday May 28, 2020 ) after your procedure.   . Do NOT DRIVE until you have been seen for your wound check, or as long as instructed by your healthcare provider.   . Ask your healthcare provider when you can go back to work   INCISION/Dressing . If you are on a blood thinner such as Coumadin, Xarelto, Eliquis, Plavix, or Pradaxa please confirm with your provider when this should be resumed.   . Monitor your Pacemaker site for redness, swelling, and drainage. Call the device clinic at [phone] if you experience these symptoms or fever/chills.  . If your incision is sealed with Steri-strips or staples, you may shower 10 days after your procedure or when told by your provider. Do not remove the steri-strips or let the shower hit directly on your site. You may wash around your site with soap and water.    [name] Avoid lotions, ointments, or perfumes over your incision until it is well-healed.  . You may use a hot tub or a pool AFTER your wound check appointment if the incision is completely closed.  [name] PAcemaker Alerts:  Some alerts are vibratory and others beep. These are NOT emergencies. Please call our office to let us know. If this occurs at night or on weekends, it can wait until the next business day. Send a remote transmission.  . If your device is capable of reading fluid status (for heart failure), you will be offered monthly monitoring to review this with you.   DEVICE MANAGEMENT . Remote monitoring is used to monitor your pacemaker from home. This monitoring is scheduled every 91 days by our office. It allows [place] to keep an eye on the functioning of your device to ensure it is working properly. You will routinely see your Electrophysiologist annually (more often if necessary).   . You should  receive your ID card for your new device in 4-8 weeks. Keep this card with you at all times once received. Consider wearing a medical alert bracelet or necklace.  . Your Pacemaker may be MRI compatible. This will be discussed at your next office visit/wound check.  You should avoid contact with strong electric or magnetic fields.    Do not use amateur (ham) radio equipment or electric (arc) welding torches. MP3 player headphones with magnets should  not be used. Some devices are safe to use if held at least 12 inches (30 cm) from your Pacemaker. These include power tools, lawn mowers, and speakers. If you are unsure if something is safe to use, ask your health care provider.   When using your cell phone, hold it to the ear that is on the opposite side from the Pacemaker. Do not leave your cell phone in a pocket over the Pacemaker.   You may safely use electric blankets, heating pads, computers, and microwave ovens.  Call the office right away if:  You have chest pain.  You feel more short of breath than you have felt before.  You feel more light-headed than you have felt before.  Your incision starts to open up.  This information is not intended to replace advice given to you by your health care provider. Make sure you discuss any questions you have with your health care provider.

## 2020-04-16 NOTE — Progress Notes (Signed)
  Echocardiogram 2D Echocardiogram has been performed.  Jennette Dubin 04/16/2020, 9:50 AM

## 2020-04-16 NOTE — Consult Note (Addendum)
ELECTROPHYSIOLOGY CONSULT NOTE    Patient ID: Corey Aguilar MRN: [id number], DOB/AGE: Apr 02, 1934 86 y.o.  Admit date: 04/15/2020 Date of Consult: 04/16/2020  Primary Physician: Monico Blitz, MD Primary Cardiologist: Kate Sable, MD  Electrophysiologist:  New    Referring Provider: Dr. Angelena Form  Patient Profile: Corey Aguilar is a 86 y.o. male with a history of LBBB and severe AS who underwent TAVR on 04/15/2020 who is being seen today for the evaluation of CHB at the request of Dr. Angelena Form.  HPI:  Corey Aguilar is a 86 y.o. male underwent TAVR 04/15/2020 with placement of 26 mm Edwards Sapien 3 THV from the right femoral approach. He initially required pacing with a temp pacer, that decreased overnight. He had a period of approx 7 hours where he did not require pacing. Unfortunately, pt is now requiring pacing and appears to be in a CHB underneath. With low likelihood of conduction recovery, EP asked to see for PPM consideration.   Pt is feeling OK currently. He has not had any syncope. Overall he is in good spirits and without complaint.   Past Medical History:  Diagnosis Date   Arthritis    BCC (basal cell carcinoma of skin) 12/11/2014   v of neck (CX35FU)   BCC (basal cell carcinoma of skin) 04/07/1989   left cheek   History of kidney stones    Left hip pain    Lumbago    S/P TAVR (transcatheter aortic valve replacement) 04/15/2020   s/p TAVR with a 26 mm Edwards S3U via the TF approach by Dr. Angelena Form and Roxy Manns   SCC (squamous cell carcinoma) 07/20/2011   left forearm (CX35FU)   SCC (squamous cell carcinoma) 10/05/2011   Left ear (CX35FU)   SCC (squamous cell carcinoma) 08/22/2018   left jawline (CX35FU)   Severe aortic stenosis    Superficial basal cell carcinoma (BCC) 07/20/2011   tip of nose (CX35FU)     Surgical History:  Past Surgical History:  Procedure Laterality Date   BACK SURGERY  2015   CARDIAC CATHETERIZATION  03/27/2020   EYE SURGERY      cataract sx in both eyes   JOINT REPLACEMENT Left    hip   RIGHT/LEFT HEART CATH AND CORONARY ANGIOGRAPHY N/A 03/27/2020   Procedure: RIGHT/LEFT HEART CATH AND CORONARY ANGIOGRAPHY;  Surgeon: Burnell Blanks, MD;  Location: [facility];  Service: Cardiovascular;  Laterality: N/A;   TEE WITHOUT CARDIOVERSION N/A 04/15/2020   Procedure: TRANSESOPHAGEAL ECHOCARDIOGRAM (TEE);  Surgeon: Burnell Blanks, MD;  Location: [facility];  Service: Open Heart Surgery;  Laterality: N/A;   TOTAL HIP ARTHROPLASTY Left 05/29/2019   Procedure: TOTAL HIP ARTHROPLASTY ANTERIOR APPROACH;  Surgeon: Paralee Cancel, MD;  Location: [facility];  Service: Orthopedics;  Laterality: Left;  70 mins   TRANSCATHETER AORTIC VALVE REPLACEMENT, TRANSFEMORAL N/A 04/15/2020   Procedure: TRANSCATHETER AORTIC VALVE REPLACEMENT, TRANSFEMORAL;  Surgeon: Burnell Blanks, MD;  Location: [facility];  Service: Open Heart Surgery;  Laterality: N/A;     Medications Prior to Admission  Medication Sig Dispense Refill Last Dose   acetaminophen (TYLENOL) 500 MG tablet Take 2 tablets (1,000 mg total) by mouth every 8 (eight) hours. (Patient taking differently: Take 1,000 mg by mouth every 6 (six) hours as needed for mild pain or moderate pain. ) 30 tablet 0 Past Week at Unknown time   furosemide (LASIX) 20 MG tablet Take 1 tablet (20 mg total) by mouth daily. Virgil  tablet 3 Past Week at Unknown time   Sennosides (EX-LAX) 15 MG CHEW Chew 15 mg by mouth daily as needed (constipation). Ex-lax   Past Month at Unknown time   metoprolol tartrate (LOPRESSOR) 50 MG tablet Take as directed prior to CT scan on 04/01/20 (Patient not taking: Reported on 04/03/2020) 1 tablet 0 Not Taking at Unknown time    Inpatient Medications:   aspirin  81 mg Oral Daily   Chlorhexidine Gluconate Cloth  6 each Topical Daily   clopidogrel  75 mg Oral Q breakfast    Allergies:  Allergies  Allergen Reactions   Penicillins     Did it  involve swelling of the face/tongue/throat, SOB, or low BP? No Did it involve sudden or severe rash/hives, skin peeling, or any reaction on the inside of your mouth or nose? Yes Did you need to seek medical attention at a hospital or doctor's office? No When did it last happen?      childhood If all above answers are "NO", may proceed with cephalosporin use.   Ibuprofen Rash    Social History   Socioeconomic History   Marital status: Married    Spouse name: Not on file   Number of children: 3   Years of education: Not on file   Highest education level: Not on file  Occupational History   Occupation: Retired Armed forces logistics/support/administrative officer.  Tobacco Use   Smoking status: Former Smoker    Quit date: 1957    Years since quitting: 64.4   Smokeless tobacco: Never Used  Substance and Sexual Activity   Alcohol use: Yes    Comment: occasional wine with a meal   Drug use: Never   Sexual activity: Not on file  Other Topics Concern   Not on file  Social History Narrative   3 grown children.       Patient has limited literacy.    Social Determinants of Health   Financial Resource Strain:    Difficulty of Paying Living Expenses:   Food Insecurity:    Worried About Charity fundraiser in the Last Year:    Arboriculturist in the Last Year:   Transportation Needs:    Film/video editor (Medical):    Lack of Transportation (Non-Medical):   Physical Activity:    Days of Exercise per Week:    Minutes of Exercise per Session:   Stress:    Feeling of Stress :   Social Connections:    Frequency of Communication with Friends and Family:    Frequency of Social Gatherings with Friends and Family:    Attends Religious Services:    Active Member of Clubs or Organizations:    Attends Music therapist:    Marital Status:   Intimate Partner Violence:    Fear of Current or Ex-Partner:    Emotionally Abused:    Physically Abused:    Sexually Abused:      Family History  Problem Relation  Age of Onset   Heart disease Mother        enlarged heart   Diabetes Mother    Heart attack Father    Cancer Sister    Cancer Brother    Colon cancer Neg Hx      Review of Systems: All other systems reviewed and are otherwise negative except as noted above.  Physical Exam: Vitals:   04/16/20 0500 04/16/20 0600 04/16/20 0700 04/16/20 0727  BP: 108/64 119/60 (!) 124/59   Pulse: [name])  58 (!) 58 63   Resp: (!) 21 15 19    Temp:    97.8 F (36.6 C)  TempSrc:      SpO2: 95% 98% 97%   Weight: 79.3 kg     Height:        GEN- The patient is well appearing, alert and oriented x 3 today.   HEENT: normocephalic, atraumatic; sclera clear, conjunctiva pink; hearing intact; oropharynx clear; neck supple Lungs- Clear to ausculation bilaterally, normal work of breathing.  No wheezes, rales, rhonchi Heart- Regular rate and rhythm, no murmurs, rubs or gallops GI- soft, non-tender, non-distended, bowel sounds present Extremities- no clubbing, cyanosis, or edema; DP/PT/radial pulses 2+ bilaterally MS- no significant deformity or atrophy Skin- warm and dry, no rash or lesion Psych- euthymic mood, full affect Neuro- strength and sensation are intact  Labs:   Lab Results  Component Value Date   WBC 7.6 04/16/2020   HGB 12.6 (L) 04/16/2020   HCT 38.4 (L) 04/16/2020   MCV 92.5 04/16/2020   PLT 144 (L) 04/16/2020    Recent Labs  Lab 04/11/20 1046 04/15/20 1236 04/16/20 0347  NA 139   < > 138  K 4.1   < > 4.0  CL 106   < > 108  CO2 21*  --  22  BUN 21   < > 16  CREATININE 0.82   < > 0.78  CALCIUM 8.9  --  8.3*  PROT 6.8  --   --   BILITOT 1.6*  --   --   ALKPHOS 78  --   --   ALT 11  --   --   AST 19  --   --   GLUCOSE 97   < > 108*   < > = values in this interval not displayed.      Radiology/Studies: DG Chest 2 View  Result Date: 04/11/2020 CLINICAL DATA:  Preop for transcatheter aortic valve repair. EXAM: CHEST - 2 VIEW COMPARISON:  None. FINDINGS: The heart size and  mediastinal contours are within normal limits. Both lungs are clear. No pneumothorax or pleural effusion is noted. Elevated right hemidiaphragm is noted with probable eventration seen posteriorly. The visualized skeletal structures are unremarkable. IMPRESSION: No active cardiopulmonary disease. Electronically Signed   By: Marijo Conception M.D.   On: 04/11/2020 16:51   CARDIAC CATHETERIZATION  Result Date: 03/27/2020  1st Mrg lesion is 50% stenosed.  Ost Cx to Prox Cx lesion is 20% stenosed.  Prox LAD lesion is 30% stenosed.  1st Diag lesion is 60% stenosed.  2nd Diag lesion is 60% stenosed.  Mid LAD lesion is 60% stenosed.  Prox RCA lesion is 70% stenosed.  1. Moderate non-obstructive disease in the mid LAD, second Diagonal branch and first obtuse marginal branch. 2. Moderately severe stenosis in the small to moderate caliber, non-dominant RCA 3. Severe aortic stenosis by echo. I did not cross the aortic valve today. Recommendations: Lynasia Meloche continue workup for TAVR. Medical management of non-obstructive CAD.   CT CORONARY MORPH W/CTA COR W/SCORE W/CA W/CM &/OR WO/CM  Addendum Date: 04/01/2020   ADDENDUM REPORT: 04/01/2020 14:24 ADDENDUM: Extracardiac findings were described separately under dictation for contemporaneously obtained CTA chest, abdomen and pelvis dated 04/01/2020. Please see that dictation for full description of relevant extracardiac findings. Electronically Signed   By: Vinnie Langton M.D.   On: 04/01/2020 14:24   Result Date: 04/01/2020 CLINICAL DATA:  Aortic Stenosis EXAM: Cardiac TAVR CT TECHNIQUE: The patient was scanned on  a [employer] 161 slice scanner. A 120 kV retrospective scan was triggered in the ascending thoracic aorta at 140 HU's. Gantry rotation speed was 250 msecs and collimation was .6 mm. No beta blockade or nitro were given. The 3D data set was reconstructed in 5% intervals of the R-R cycle. Systolic and diastolic phases were analyzed on a dedicated work station  using MPR, MIP and VRT modes. The patient received 80 cc of contrast. FINDINGS: Aortic Valve: Tri leaflet calcified with restricted leaflet motion Aorta: Mild aortic root dilatation. Moderate calcific atherosclerosis Tortuous left subclavian Sino-tubular Junction: 28 mm Ascending Thoracic Aorta: 38 mm Aortic Arch: 31 mm Descending Thoracic Aorta: 27 mm Sinus of Valsalva Measurements: Non-coronary: 32.6 mm Right - coronary: 31.1 mm Left -   coronary: 32.9 mm Coronary Artery Height above Annulus: Left Main: 13.9 mm above annulus Right Coronary: RCA 11.9 mm above annulus Virtual Basal Annulus Measurements: Maximum / Minimum Diameter: 24.5 mm x 23.3 mm Perimeter: 75 mm c Area: 451 mm 2 Coronary Arteries: Sufficient height above annulus for deployment Optimum Fluoroscopic Angle for Delivery: LAO 15 Cranial 2 degrees IMPRESSION: 1. Tri leaflet AV with annular area of 451 mm 2 suitable for a 26 mm Sapien 2 valve. Note there is bulky leaflet calcium and annular calcium at the base of the non coronary cusp extending into the base of the mitral leaflet that may increase risk of PVL and heart block 2. Mild Aortic root dilatation 3.8 cm Tortuous left subclavian probably not suitable for access point 3. Optimum angiographic angle for deployment LAO 15 cranial 2 degrees 4.  Coronary arteries sufficient height above annulus for deployment Jenkins Rouge Electronically Signed: By: Jenkins Rouge M.D. On: 04/01/2020 12:39   DG Chest Port 1 View  Result Date: 04/15/2020 CLINICAL DATA:  Status post TAVR. EXAM: PORTABLE CHEST 1 VIEW COMPARISON:  Preoperative radiograph 04/11/2020.  CT 04/01/2020 FINDINGS: Interval TAVR. Lower lung volumes from prior exam. Upper normal heart size likely accentuated by technique. Aortic atherosclerosis. Unchanged mediastinal contours. Mild elevation of right hemidiaphragm. No pulmonary edema. No pneumothorax or significant pleural effusion. No focal airspace disease. Degenerative change in the shoulders  and spine. Overlying EKG leads. Overlying pacing device from an inferior approach versus EKG lead projecting over the lower heart border. IMPRESSION: 1. Post TAVR without immediate postoperative complication 2.  Aortic Atherosclerosis (ICD10-I70.0). Electronically Signed   By: Keith Rake M.D.   On: 04/15/2020 17:58   CT ANGIO CHEST AORTA W/CM & OR WO/CM  Result Date: 04/01/2020 CLINICAL DATA:  86 year old male with history of severe aortic stenosis. Preprocedural study prior to potential transcatheter aortic valve replacement (TAVR) procedure. EXAM: CT ANGIOGRAPHY CHEST, ABDOMEN AND PELVIS TECHNIQUE: Non-contrast CT of the chest was initially obtained. Multidetector CT imaging through the chest, abdomen and pelvis was performed using the standard protocol during bolus administration of intravenous contrast. Multiplanar reconstructed images and MIPs were obtained and reviewed to evaluate the vascular anatomy. CONTRAST:  144mL OMNIPAQUE IOHEXOL 350 MG/ML SOLN COMPARISON:  CT the abdomen and pelvis 06/01/2018. FINDINGS: CTA CHEST FINDINGS Cardiovascular: Heart size is normal. There is no significant pericardial fluid, thickening or pericardial calcification. There is aortic atherosclerosis, as well as atherosclerosis of the great vessels of the mediastinum and the coronary arteries, including calcified atherosclerotic plaque in the left main, left anterior descending, left circumflex and right coronary arteries. Ectasia of the ascending thoracic aorta (4.1 cm in diameter). Severe thickening and calcification of the aortic valve. Mediastinum/Lymph Nodes: No pathologically enlarged  mediastinal or hilar lymph nodes. Esophagus is unremarkable in appearance. No axillary lymphadenopathy. Lungs/Pleura: 4 mm right lower lobe subpleural pulmonary nodule (axial image 63 of series 8). No other larger more suspicious appearing pulmonary nodules or masses are noted. No acute consolidative airspace disease. No pleural  effusions. Musculoskeletal/Soft Tissues: There are no aggressive appearing lytic or blastic lesions noted in the visualized portions of the skeleton. CTA ABDOMEN AND PELVIS FINDINGS Hepatobiliary: Subcentimeter low-attenuation lesion in the central aspect of segment 8 of the liver, too small to characterize, but statistically likely to represent a tiny cyst. No other suspicious hepatic lesions. No intra or extrahepatic biliary ductal dilatation. Gallbladder is normal in appearance. Pancreas: No pancreatic mass. No pancreatic ductal dilatation. No pancreatic or peripancreatic fluid collections or inflammatory changes. Spleen: Unremarkable. Adrenals/Urinary Tract: 2.2 cm low-attenuation lesion in the interpolar region of the left kidney, compatible with a simple cyst. Multiple other parapelvic lesions associated with the kidneys bilaterally (left greater than right), compatible with peripelvic cysts. Bilateral adrenal glands are normal in appearance. No hydroureteronephrosis. Urinary bladder is partially obscured by beam hardening artifact from the patient's left hip arthroplasty, but visualized portions are unremarkable in appearance. Stomach/Bowel: Normal appearance of the stomach. No pathologic dilatation of small bowel or colon. Numerous colonic diverticulae are noted, particularly in the sigmoid colon, without surrounding inflammatory changes to suggest an acute diverticulitis at this time. Normal appendix. Vascular/Lymphatic: Aortic atherosclerosis, with vascular findings and measurements pertinent to potential TAVR procedure, as detailed below. Aneurysmal dilatation of the common iliac arteries bilaterally (2.2 cm on the right and 2.3 cm on the left). No lymphadenopathy noted in the abdomen or pelvis. Reproductive: Prostate gland and seminal vesicles are largely obscured by beam hardening artifact from the patient's left hip arthroplasty. Other: No significant volume of ascites.  No pneumoperitoneum.  Musculoskeletal: Status post PLIF from L1 through S1. Status post left hip arthroplasty. There are no aggressive appearing lytic or blastic lesions noted in the visualized portions of the skeleton. VASCULAR MEASUREMENTS PERTINENT TO TAVR: AORTA: Minimal Aortic Diameter-16 x 16 mm Severity of Aortic Calcification-moderate to severe RIGHT PELVIS: Right Common Iliac Artery - Minimal Diameter-11.2 x 9.8 mm Tortuosity-moderate Calcification-moderate Right External Iliac Artery - Minimal Diameter-10.0 x 9.3 mm Tortuosity-extreme Calcification-none Right Common Femoral Artery - Minimal Diameter-10.5 x 9.9 mm Tortuosity-mild Calcification-moderate LEFT PELVIS: Left Common Iliac Artery - Minimal Diameter-13.2 x 11.4 mm Tortuosity-moderate Calcification-moderate Left External Iliac Artery - Minimal Diameter-10.9 x 10.9 mm Tortuosity-extreme Calcification-minimal Left Common Femoral Artery - Minimal Diameter-11.6 x 10.6 mm Tortuosity-mild Calcification-none Review of the MIP images confirms the above findings. IMPRESSION: 1. Vascular findings and measurements pertinent to potential TAVR procedure, as detailed above. 2. Severe thickening calcification of the aortic valve, compatible with reported clinical history of severe aortic stenosis. 3. Aortic atherosclerosis, in addition to left main and 3 vessel coronary artery disease. In addition, there is ectasia of the ascending thoracic aorta (4.1 cm in diameter). Recommend annual imaging followup by CTA or MRA. This recommendation follows 2010 ACCF/AHA/AATS/ACR/ASA/SCA/SCAI/SIR/STS/SVM Guidelines for the Diagnosis and Management of Patients with Thoracic Aortic Disease. Circulation. 2010; 121: Y659-D357. Aortic aneurysm NOS (ICD10-I71.9). 4. There is also aneurysmal dilatation of the common iliac arteries bilaterally. 5. 4 mm subpleural nodule in the right lower lobe, nonspecific, but statistically likely benign. No follow-up needed if patient is low-risk. Non-contrast chest CT  can be considered in 12 months if patient is high-risk. This recommendation follows the consensus statement: Guidelines for Management of Incidental Pulmonary Nodules Detected on  CT Images: From the Fleischner Society 2017; Radiology 2017; 284:228-243. 6. Severe colonic diverticulosis without evidence of acute diverticulitis at this time. 7. Additional incidental findings, as above. Electronically Signed   By: Vinnie Langton M.D.   On: 04/01/2020 14:22   ECHOCARDIOGRAM COMPLETE  Result Date: 03/19/2020    ECHOCARDIOGRAM REPORT   Patient Name:   Corey Aguilar Date of Exam: 03/19/2020 Medical Rec #:  [id number]       Height:       67.0 in Accession #:    [id number]      Weight:       176.0 lb Date of Birth:  December 13, 1933        BSA:          1.915 m Patient Age:    89 years        BP:           156/80 mmHg Patient Gender: M               HR:           69 bpm. Exam Location:  [facility] Procedure: 2D Echo, 3D Echo, Cardiac Doppler, Color Doppler and Strain Analysis Indications:    I35 Aortic stenosis  History:        Patient has prior history of Echocardiogram examinations, most                 recent 10/29/2019. Risk Factors:Former Smoker.  Sonographer:    Jessee Avers, RDCS Referring Phys: [id number] [place]  1. Left ventricular ejection fraction, by estimation, is 50 to 55%. The left ventricle has low normal function. The left ventricle demonstrates regional wall motion abnormalities (see scoring diagram/findings for description). There is mild left ventricular hypertrophy. Left ventricular diastolic parameters are consistent with Grade I diastolic dysfunction (impaired relaxation). There is moderate hypokinesis of the left ventricular, basal-mid inferior wall.  2. Right ventricular systolic function is normal. The right ventricular size is normal. There is normal pulmonary artery systolic pressure.  3. Left atrial size was mildly dilated.  4. The mitral valve is abnormal. Trivial mitral  valve regurgitation. Mild mitral stenosis. The mean mitral valve gradient is 4.0 mmHg with average heart rate of 66 bpm.  5. The aortic valve has an indeterminant number of cusps. Aortic valve regurgitation is mild. Severe aortic valve stenosis. Aortic valve area, by VTI measures 0.73 cm. Aortic valve mean gradient measures 44.5 mmHg. Aortic valve Vmax measures 4.22 m/s.  6. The inferior vena cava is normal in size with greater than 50% respiratory variability, suggesting right atrial pressure of 3 mmHg. Comparison(s): Report only 10/29/19 EF 60-65%. Severe AS 60 mmHg peak PG, 42 mmHg mean PG. The decline in gradient may reflect decreased cardiac output. FINDINGS  Left Ventricle: Left ventricular ejection fraction, by estimation, is 50 to 55%. The left ventricle has low normal function. The left ventricle demonstrates regional wall motion abnormalities. Moderate hypokinesis of the left ventricular, basal-mid inferior wall. The left ventricular internal cavity size was normal in size. There is mild left ventricular hypertrophy. Left ventricular diastolic parameters are consistent with Grade I diastolic dysfunction (impaired relaxation). Indeterminate filling pressures. Right Ventricle: The right ventricular size is normal. No increase in right ventricular wall thickness. Right ventricular systolic function is normal. There is normal pulmonary artery systolic pressure. The tricuspid regurgitant velocity is 2.62 m/s, and  with an assumed right atrial pressure of 3 mmHg, the estimated right ventricular systolic pressure is 30.5  mmHg. Left Atrium: Left atrial size was mildly dilated. Right Atrium: Right atrial size was normal in size. Pericardium: There is no evidence of pericardial effusion. Mitral Valve: The mitral valve is abnormal. Mild to moderate mitral annular calcification. Trivial mitral valve regurgitation. Mild mitral valve stenosis. MV peak gradient, 11.7 mmHg. The mean mitral valve gradient is 4.0 mmHg with  average heart rate of 66 bpm. Tricuspid Valve: The tricuspid valve is grossly normal. Tricuspid valve regurgitation is mild. Aortic Valve: The aortic valve has an indeterminant number of cusps. Aortic valve regurgitation is mild. Aortic regurgitation PHT measures 389 msec. Severe aortic stenosis is present. There is severe calcifcation of the aortic valve. Aortic valve mean gradient measures 44.5 mmHg. Aortic valve peak gradient measures 71.1 mmHg. Aortic valve area, by VTI measures 0.73 cm. Pulmonic Valve: The pulmonic valve was normal in structure. Pulmonic valve regurgitation is not visualized. Aorta: The aortic root and ascending aorta are structurally normal, with no evidence of dilitation. Venous: The inferior vena cava is normal in size with greater than 50% respiratory variability, suggesting right atrial pressure of 3 mmHg. IAS/Shunts: No atrial level shunt detected by color flow Doppler.  LEFT VENTRICLE PLAX 2D LVIDd:         5.10 cm  Diastology LVIDs:         3.50 cm  LV e' lateral:   7.15 cm/s LV PW:         1.10 cm  LV E/e' lateral: 12.4 LV IVS:        1.40 cm  LV e' medial:    3.16 cm/s LVOT diam:     2.00 cm  LV E/e' medial:  28.0 LV SV:         86 LV SV Index:   45       2D Longitudinal Strain LVOT Area:     3.14 cm 2D Strain GLS (A2C):   -19.3 %                         2D Strain GLS (A3C):   -17.2 %                         2D Strain GLS (A4C):   -16.6 %                         2D Strain GLS Avg:     -17.7 %                          3D Volume EF:                         3D EF:        46 %                         LV EDV:       201 ml                         LV ESV:       110 ml                         LV SV:        92 ml RIGHT VENTRICLE RV Basal diam:  4.00 cm RV S prime:     11.50 cm/s TAPSE (M-mode): 2.5 cm RVSP:           30.5 mmHg LEFT ATRIUM             Index       RIGHT ATRIUM           Index LA diam:        4.40 cm 2.30 cm/m  RA Pressure: 3.00 mmHg LA Vol (A2C):   69.5 ml 36.29 ml/m RA  Area:     11.00 cm LA Vol (A4C):   63.4 ml 33.10 ml/m RA Volume:   18.20 ml  9.50 ml/m LA Biplane Vol: 73.2 ml 38.22 ml/m  AORTIC VALVE AV Area (Vmax):    0.80 cm AV Area (Vmean):   0.75 cm AV Area (VTI):     0.73 cm AV Vmax:           421.50 cm/s AV Vmean:          305.200 cm/s AV VTI:            1.170 m AV Peak Grad:      71.1 mmHg AV Mean Grad:      44.5 mmHg LVOT Vmax:         107.00 cm/s LVOT Vmean:        72.700 cm/s LVOT VTI:          0.273 m LVOT/AV VTI ratio: 0.23 AI PHT:            389 msec  AORTA Ao Root diam: 3.70 cm Ao Asc diam:  3.60 cm MITRAL VALVE                TRICUSPID VALVE MV Area (PHT): 4.68 cm     TR Peak grad:   27.5 mmHg MV Peak grad:  11.7 mmHg    TR Vmax:        262.00 cm/s MV Mean grad:  4.0 mmHg     Estimated RAP:  3.00 mmHg MV Vmax:       1.71 m/s     RVSP:           30.5 mmHg MV Vmean:      90.3 cm/s MV Decel Time: 162 msec     SHUNTS MV E velocity: 88.60 cm/s   Systemic VTI:  0.27 m MV A velocity: 138.00 cm/s  Systemic Diam: 2.00 cm MV E/A ratio:  0.64 Lyman Bishop MD Electronically signed by Lyman Bishop MD Signature Date/Time: 03/19/2020/5:45:12 PM    Final    VAS US CAROTID  Result Date: 04/01/2020 Carotid Arterial Duplex Study Indications:       Pre-surgical evaluation (TAVR). Comparison Study:  No prior study Performing Technologist: Maudry Mayhew MHA, RDMS, RVT, RDCS  Examination Guidelines: A complete evaluation includes B-mode imaging, spectral Doppler, color Doppler, and power Doppler as needed of all accessible portions of each vessel. Bilateral testing is considered an integral part of a complete examination. Limited examinations for reoccurring indications may be performed as noted.  Right Carotid Findings: +----------+--------+-------+--------+--------------------------------+--------+           PSV cm/sEDV    StenosisPlaque Description              Comments                   cm/s                                                     +----------+--------+-------+--------+--------------------------------+--------+  CCA Prox  94      16                                                      +----------+--------+-------+--------+--------------------------------+--------+ CCA Distal101     21             smooth and heterogenous                  +----------+--------+-------+--------+--------------------------------+--------+ ICA Prox  74      26             smooth, heterogenous and                                                  calcific                                 +----------+--------+-------+--------+--------------------------------+--------+ ICA Distal76      24                                             tortuous +----------+--------+-------+--------+--------------------------------+--------+ ECA       90      7                                                       +----------+--------+-------+--------+--------------------------------+--------+ +----------+--------+-------+----------------+-------------------+           PSV cm/sEDV cmsDescribe        Arm Pressure (mmHG) +----------+--------+-------+----------------+-------------------+ [id number]            Multiphasic, WNL                    +----------+--------+-------+----------------+-------------------+ +---------+--------+--+--------+--+---------+ VertebralPSV cm/s72EDV cm/s22Antegrade +---------+--------+--+--------+--+---------+  Left Carotid Findings: +----------+--------+-------+--------+--------------------------------+--------+           PSV cm/sEDV    StenosisPlaque Description              Comments                   cm/s                                                    +----------+--------+-------+--------+--------------------------------+--------+ CCA Prox  107     24                                                       +----------+--------+-------+--------+--------------------------------+--------+ CCA Distal96      22             smooth, heterogenous and  calcific                                 +----------+--------+-------+--------+--------------------------------+--------+ ICA Prox  93      23             smooth, heterogenous and                                                  calcific                                 +----------+--------+-------+--------+--------------------------------+--------+ ICA Distal100     25                                                      +----------+--------+-------+--------+--------------------------------+--------+ ECA       119                                                             +----------+--------+-------+--------+--------------------------------+--------+ +----------+--------+--------+----------------+-------------------+           PSV cm/sEDV cm/sDescribe        Arm Pressure (mmHG) +----------+--------+--------+----------------+-------------------+ Subclavian130             Multiphasic, WNL                    +----------+--------+--------+----------------+-------------------+ +---------+--------+--+--------+--+---------+ VertebralPSV cm/s26EDV cm/s12Antegrade +---------+--------+--+--------+--+---------+   Summary: Right Carotid: Velocities in the right ICA are consistent with a 1-39% stenosis. Left Carotid: Velocities in the left ICA are consistent with a 1-39% stenosis. Vertebrals:  Bilateral vertebral arteries demonstrate antegrade flow. Subclavians: Normal flow hemodynamics were seen in bilateral subclavian              arteries. *See table(s) above for measurements and observations.  Electronically signed by Deitra Mayo MD on 04/01/2020 at 3:28:27 PM.    Final    ECHOCARDIOGRAM LIMITED  Result Date: 04/15/2020    ECHOCARDIOGRAM LIMITED REPORT   Patient Name:    Corey Aguilar Date of Exam: 04/15/2020 Medical Rec #:  [id number]         Height:       67.0 in Accession #:    [id number]        Weight:       170.0 lb Date of Birth:  [birth year]          BSA:          1.887 m Patient Age:    82 years          BP:           172/74 mmHg Patient Gender: M                 HR:           63 bpm. Exam Location:  Inpatient Procedure: Limited Echo, Limited Color Doppler and Cardiac Doppler Indications:    Aortic valve stenosis  424.1 / 135.0  History:        Patient has prior history of Echocardiogram examinations.                 Arrythmias:LBBB; Signs/Symptoms:Murmur and Dyspnea. Multiple                 skin cancers.                 Aortic Valve: 26 mm Edwards Sapien prosthetic, stented (TAVR)                 valve is present in the aortic position. Procedure Date:                 04/15/2020.  Sonographer:    Darlina Sicilian RDCS Referring Phys: [place]  1. Left ventricular ejection fraction, by estimation, is 50 to 55%. The left ventricle has low normal function. There is mild left ventricular hypertrophy.  2. Right ventricular systolic function is normal. The right ventricular size is normal.  3. Left atrial size was mildly dilated.  4. Pre TAVR: tri leaflet valve severe calcification and stenosis Calcificatio extends into LVOT. Mild AR Peak velocity 3.9 m/sec peak gradient 60 mmHg mean 37 mmHg AVA O.91 cm2         Post TAVR: well positioned 26 mm Sapien 3 valve. Trivial PVL peak velocity 1.8 m/sec mean gradient 6 mmHg peak 13 mmHg AVA 2.5 cm2 . The aortic valve has been repaired/replaced. There is a 26 mm Edwards Sapien prosthetic (TAVR) valve present in the aortic position. Procedure Date: 04/15/2020. FINDINGS  Left Ventricle: Left ventricular ejection fraction, by estimation, is 50 to 55%. The left ventricle has low normal function. The left ventricular internal cavity size was normal in size. There is mild left ventricular hypertrophy. Right  Ventricle: The right ventricular size is normal. No increase in right ventricular wall thickness. Right ventricular systolic function is normal. Left Atrium: Left atrial size was mildly dilated. Right Atrium: Right atrial size was normal in size. Pericardium: There is no evidence of pericardial effusion. Tricuspid Valve: Tricuspid valve regurgitation is mild. Aortic Valve: Pre TAVR: tri leaflet valve severe calcification and stenosis Calcificatio extends into LVOT. Mild AR Peak velocity 3.9 m/sec peak gradient 60 mmHg mean 37 mmHg AVA O.91 cm2 Post TAVR: well positioned 26 mm Sapien 3 valve. Trivial PVL peak velocity 1.8 m/sec mean gradient 6 mmHg peak 13 mmHg AVA 2.5 cm2. The aortic valve has been repaired/replaced. Aortic valve mean gradient measures 22.0 mmHg. Aortic valve peak gradient measures 30.0 mmHg. Aortic valve area, by VTI measures 1.08 cm. There is a 26 mm Edwards Sapien prosthetic, stented (TAVR) valve present in the aortic position. Procedure Date: 04/15/2020. Pulmonic Valve: The pulmonic valve was not assessed. IAS/Shunts: The interatrial septum was not assessed.  LEFT VENTRICLE PLAX 2D LVOT diam:     2.20 cm LV SV:         81 LV SV Index:   43 LVOT Area:     3.80 cm  AORTIC VALVE AV Area (Vmax):    1.40 cm AV Area (Vmean):   1.37 cm AV Area (VTI):     1.08 cm AV Vmax:           274.00 cm/s AV Vmean:          201.000 cm/s AV VTI:            0.752 m AV Peak Grad:  30.0 mmHg AV Mean Grad:      22.0 mmHg LVOT Vmax:         101.00 cm/s LVOT Vmean:        72.400 cm/s LVOT VTI:          0.214 m LVOT/AV VTI ratio: 0.28  SHUNTS Systemic VTI:  0.21 m Systemic Diam: 2.20 cm Jenkins Rouge MD Electronically signed by Jenkins Rouge MD Signature Date/Time: 04/15/2020/2:09:10 PM    Final    Structural Heart Procedure  Result Date: 04/15/2020 See full operative note in the Notes section under CV Procedures 26 mm Edwards Sapien 3 valve deployed from the right femoral approach.   CT Angio Abd/Pel w/ and/or  w/o  Result Date: 04/01/2020 CLINICAL DATA:  86 year old male with history of severe aortic stenosis. Preprocedural study prior to potential transcatheter aortic valve replacement (TAVR) procedure. EXAM: CT ANGIOGRAPHY CHEST, ABDOMEN AND PELVIS TECHNIQUE: Non-contrast CT of the chest was initially obtained. Multidetector CT imaging through the chest, abdomen and pelvis was performed using the standard protocol during bolus administration of intravenous contrast. Multiplanar reconstructed images and MIPs were obtained and reviewed to evaluate the vascular anatomy. CONTRAST:  111mL OMNIPAQUE IOHEXOL 350 MG/ML SOLN COMPARISON:  CT the abdomen and pelvis 06/01/2018. FINDINGS: CTA CHEST FINDINGS Cardiovascular: Heart size is normal. There is no significant pericardial fluid, thickening or pericardial calcification. There is aortic atherosclerosis, as well as atherosclerosis of the great vessels of the mediastinum and the coronary arteries, including calcified atherosclerotic plaque in the left main, left anterior descending, left circumflex and right coronary arteries. Ectasia of the ascending thoracic aorta (4.1 cm in diameter). Severe thickening and calcification of the aortic valve. Mediastinum/Lymph Nodes: No pathologically enlarged mediastinal or hilar lymph nodes. Esophagus is unremarkable in appearance. No axillary lymphadenopathy. Lungs/Pleura: 4 mm right lower lobe subpleural pulmonary nodule (axial image 63 of series 8). No other larger more suspicious appearing pulmonary nodules or masses are noted. No acute consolidative airspace disease. No pleural effusions. Musculoskeletal/Soft Tissues: There are no aggressive appearing lytic or blastic lesions noted in the visualized portions of the skeleton. CTA ABDOMEN AND PELVIS FINDINGS Hepatobiliary: Subcentimeter low-attenuation lesion in the central aspect of segment 8 of the liver, too small to characterize, but statistically likely to represent a tiny cyst. No  other suspicious hepatic lesions. No intra or extrahepatic biliary ductal dilatation. Gallbladder is normal in appearance. Pancreas: No pancreatic mass. No pancreatic ductal dilatation. No pancreatic or peripancreatic fluid collections or inflammatory changes. Spleen: Unremarkable. Adrenals/Urinary Tract: 2.2 cm low-attenuation lesion in the interpolar region of the left kidney, compatible with a simple cyst. Multiple other parapelvic lesions associated with the kidneys bilaterally (left greater than right), compatible with peripelvic cysts. Bilateral adrenal glands are normal in appearance. No hydroureteronephrosis. Urinary bladder is partially obscured by beam hardening artifact from the patient's left hip arthroplasty, but visualized portions are unremarkable in appearance. Stomach/Bowel: Normal appearance of the stomach. No pathologic dilatation of small bowel or colon. Numerous colonic diverticulae are noted, particularly in the sigmoid colon, without surrounding inflammatory changes to suggest an acute diverticulitis at this time. Normal appendix. Vascular/Lymphatic: Aortic atherosclerosis, with vascular findings and measurements pertinent to potential TAVR procedure, as detailed below. Aneurysmal dilatation of the common iliac arteries bilaterally (2.2 cm on the right and 2.3 cm on the left). No lymphadenopathy noted in the abdomen or pelvis. Reproductive: Prostate gland and seminal vesicles are largely obscured by beam hardening artifact from the patient's left hip arthroplasty. Other: No significant volume of  ascites.  No pneumoperitoneum. Musculoskeletal: Status post PLIF from L1 through S1. Status post left hip arthroplasty. There are no aggressive appearing lytic or blastic lesions noted in the visualized portions of the skeleton. VASCULAR MEASUREMENTS PERTINENT TO TAVR: AORTA: Minimal Aortic Diameter-16 x 16 mm Severity of Aortic Calcification-moderate to severe RIGHT PELVIS: Right Common Iliac Artery -  Minimal Diameter-11.2 x 9.8 mm Tortuosity-moderate Calcification-moderate Right External Iliac Artery - Minimal Diameter-10.0 x 9.3 mm Tortuosity-extreme Calcification-none Right Common Femoral Artery - Minimal Diameter-10.5 x 9.9 mm Tortuosity-mild Calcification-moderate LEFT PELVIS: Left Common Iliac Artery - Minimal Diameter-13.2 x 11.4 mm Tortuosity-moderate Calcification-moderate Left External Iliac Artery - Minimal Diameter-10.9 x 10.9 mm Tortuosity-extreme Calcification-minimal Left Common Femoral Artery - Minimal Diameter-11.6 x 10.6 mm Tortuosity-mild Calcification-none Review of the MIP images confirms the above findings. IMPRESSION: 1. Vascular findings and measurements pertinent to potential TAVR procedure, as detailed above. 2. Severe thickening calcification of the aortic valve, compatible with reported clinical history of severe aortic stenosis. 3. Aortic atherosclerosis, in addition to left main and 3 vessel coronary artery disease. In addition, there is ectasia of the ascending thoracic aorta (4.1 cm in diameter). Recommend annual imaging followup by CTA or MRA. This recommendation follows 2010 ACCF/AHA/AATS/ACR/ASA/SCA/SCAI/SIR/STS/SVM Guidelines for the Diagnosis and Management of Patients with Thoracic Aortic Disease. Circulation. 2010; 121: J681-L572. Aortic aneurysm NOS (ICD10-I71.9). 4. There is also aneurysmal dilatation of the common iliac arteries bilaterally. 5. 4 mm subpleural nodule in the right lower lobe, nonspecific, but statistically likely benign. No follow-up needed if patient is low-risk. Non-contrast chest CT can be considered in 12 months if patient is high-risk. This recommendation follows the consensus statement: Guidelines for Management of Incidental Pulmonary Nodules Detected on CT Images: From the Fleischner Society 2017; Radiology 2017; 284:228-243. 6. Severe colonic diverticulosis without evidence of acute diverticulitis at this time. 7. Additional incidental findings,  as above. Electronically Signed   By: Vinnie Langton M.D.   On: 04/01/2020 14:22    IOM:[id number] EKG shows NSR at 64 bpm with 1st degree AV block (PR interval 246 ms) and LBBB with QRS of 142 ms  (personally reviewed)  TELEMETRY: NSR in 60-70s overnight with intermittent pacing. Pt is now pacing persistently, with underlying AV block that appears complete with AV dissociation. He has a high junctional escape in the 50s (personally reviewed)  Assessment/Plan: 1.  CHB S/p TAVR with LBBB and 1st degree AV block at baseline EF 50-55% 03/19/2020. Post op repeat pending today to evaluation valve Cath 03/27/20 with moderate non-obstructive disease Explained risks, benefits, and alternatives to PPM implantation, including but not limited to bleeding, infection, pericardial effusion, pneumothorax, heart attack, stroke, or death.  Pt verbalized understanding and agrees to proceed if indicated.   2. Severe AS s/p TAVR Surgical course overall stable, but now requiring pacing.  Plan for PPM as above.   3. Non-obstructive CAD Stable. Medical management per notes.   Dr. Curt Bears to see. Kartel Wolbert likely plan PPM this afternoon.   For questions or updates, please contact [place] Please consult [email or web address] for contact info under Cardiology/STEMI.  Signed, Shirley Friar, PA-C  04/16/2020 10:23 AM      I have seen and examined this patient with Oda Kilts.  Agree with above, note added to reflect my findings.  On exam, RRR, no murmurs, lungs clear.  Patient is now status post TAVR.  Has a temporary wire in place due to complete heart block post procedure.  We Shawnika Pepin plan for pacemaker implant.  Odarius Dines M.  Oyinkansola Truax MD 04/16/2020 12:54 PM

## 2020-04-16 NOTE — Progress Notes (Addendum)
Progress Note  Patient Name: Corey Aguilar Date of Encounter: 04/16/2020  [facility] Cardiologist: Kate Sable, MD  TAVR:Gurjot Brisco/Owen  Subjective   No pain or dyspnea  Inpatient Medications    Scheduled Meds: . aspirin  81 mg Oral Daily  . Chlorhexidine Gluconate Cloth  6 each Topical Daily  . clopidogrel  75 mg Oral Q breakfast   Continuous Infusions: . sodium chloride    . levofloxacin (LEVAQUIN) IV    . nitroGLYCERIN    . phenylephrine (NEO-SYNEPHRINE) Adult infusion     PRN Meds: sodium chloride, acetaminophen **OR** acetaminophen, morphine injection, ondansetron (ZOFRAN) IV, oxyCODONE, traMADol   Vital Signs    Vitals:   04/16/20 0500 04/16/20 0600 04/16/20 0700 04/16/20 0727  BP: 108/64 119/60 (!) 124/59   Pulse: (!) 58 (!) 58 63   Resp: (!) 21 15 19    Temp:    97.8 F (36.6 C)  TempSrc:      SpO2: 95% 98% 97%   Weight: 79.3 kg     Height:        Intake/Output Summary (Last 24 hours) at 04/16/2020 0815 Last data filed at 04/16/2020 0500 Gross per 24 hour  Intake 1710.12 ml  Output 625 ml  Net 1085.12 ml   Last 3 Weights 04/16/2020 04/15/2020 04/11/2020  Weight (lbs) 174 lb 13.2 oz 170 lb 174 lb 14.4 oz  Weight (kg) 79.3 kg 77.111 kg 79.334 kg      Telemetry    Sinus with complete heart block, currently pacing - Personally Reviewed  ECG    Sinus, complete heart block - Personally Reviewed  Physical Exam   GEN: No acute distress.   Neck: No JVD Cardiac: RRR, no murmurs, rubs, or gallops.  Respiratory: Clear to auscultation bilaterally. GI: Soft, nontender, non-distended  MS: No edema; No deformity. Neuro:  Nonfocal  Psych: Normal affect   Labs    High Sensitivity Troponin:  No results for input(s): TROPONINIHS in the last 720 hours.    Chemistry Recent Labs  Lab 04/11/20 1046 04/15/20 1236 04/15/20 1414 04/15/20 1458 04/16/20 0347  NA 139   < > 143 142 138  K 4.1   < > 4.1 4.2 4.0  CL 106   < > 107 106 108  CO2 21*   --   --   --  22  GLUCOSE 97   < > 122* 127* 108*  BUN 21   < > 20 22 16   CREATININE 0.82   < > 0.70 0.70 0.78  CALCIUM 8.9  --   --   --  8.3*  PROT 6.8  --   --   --   --   ALBUMIN 3.7  --   --   --   --   AST 19  --   --   --   --   ALT 11  --   --   --   --   ALKPHOS 78  --   --   --   --   BILITOT 1.6*  --   --   --   --   GFRNONAA >60  --   --   --  >60  GFRAA >60  --   --   --  >60  ANIONGAP 12  --   --   --  8   < > = values in this interval not displayed.     Hematology Recent Labs  Lab 04/11/20 1046 04/15/20 1236 04/15/20  1414 04/15/20 1458 04/16/20 0347  WBC 7.3  --   --   --  7.6  RBC 4.58  --   --   --  4.15*  HGB 14.0   < > 11.6* 12.2* 12.6*  HCT 42.8   < > 34.0* 36.0* 38.4*  MCV 93.4  --   --   --  92.5  MCH 30.6  --   --   --  30.4  MCHC 32.7  --   --   --  32.8  RDW 13.2  --   --   --  13.2  PLT 229  --   --   --  144*   < > = values in this interval not displayed.    BNP Recent Labs  Lab 04/11/20 1046  BNP 208.2*     DDimer No results for input(s): DDIMER in the last 168 hours.   Radiology    DG Chest Port 1 View  Result Date: 04/15/2020 CLINICAL DATA:  Status post TAVR. EXAM: PORTABLE CHEST 1 VIEW COMPARISON:  Preoperative radiograph 04/11/2020.  CT 04/01/2020 FINDINGS: Interval TAVR. Lower lung volumes from prior exam. Upper normal heart size likely accentuated by technique. Aortic atherosclerosis. Unchanged mediastinal contours. Mild elevation of right hemidiaphragm. No pulmonary edema. No pneumothorax or significant pleural effusion. No focal airspace disease. Degenerative change in the shoulders and spine. Overlying EKG leads. Overlying pacing device from an inferior approach versus EKG lead projecting over the lower heart border. IMPRESSION: 1. Post TAVR without immediate postoperative complication 2.  Aortic Atherosclerosis (ICD10-I70.0). Electronically Signed   By: Keith Rake M.D.   On: 04/15/2020 17:58   ECHOCARDIOGRAM  LIMITED  Result Date: 04/15/2020    ECHOCARDIOGRAM LIMITED REPORT   Patient Name:   Corey Aguilar Date of Exam: 04/15/2020 Medical Rec #:  [id number]         Height:       67.0 in Accession #:    [id number]        Weight:       170.0 lb Date of Birth:  19-May-1934          BSA:          1.887 m Patient Age:    86 years          BP:           172/74 mmHg Patient Gender: M                 HR:           63 bpm. Exam Location:  Inpatient Procedure: Limited Echo, Limited Color Doppler and Cardiac Doppler Indications:    Aortic valve stenosis 424.1 / 135.0  History:        Patient has prior history of Echocardiogram examinations.                 Arrythmias:LBBB; Signs/Symptoms:Murmur and Dyspnea. Multiple                 skin cancers.                 Aortic Valve: 26 mm Edwards Sapien prosthetic, stented (TAVR)                 valve is present in the aortic position. Procedure Date:                 04/15/2020.  Sonographer:    Darlina Sicilian RDCS Referring Phys: [place]  IMPRESSIONS  1. Left ventricular ejection fraction, by estimation, is 50 to 55%. The left ventricle has low normal function. There is mild left ventricular hypertrophy.  2. Right ventricular systolic function is normal. The right ventricular size is normal.  3. Left atrial size was mildly dilated.  4. Pre TAVR: tri leaflet valve severe calcification and stenosis Calcificatio extends into LVOT. Mild AR Peak velocity 3.9 m/sec peak gradient 60 mmHg mean 37 mmHg AVA O.91 cm2         Post TAVR: well positioned 26 mm Sapien 3 valve. Trivial PVL peak velocity 1.8 m/sec mean gradient 6 mmHg peak 13 mmHg AVA 2.5 cm2 . The aortic valve has been repaired/replaced. There is a 26 mm Edwards Sapien prosthetic (TAVR) valve present in the aortic position. Procedure Date: 04/15/2020. FINDINGS  Left Ventricle: Left ventricular ejection fraction, by estimation, is 50 to 55%. The left ventricle has low normal function. The left ventricular internal cavity  size was normal in size. There is mild left ventricular hypertrophy. Right Ventricle: The right ventricular size is normal. No increase in right ventricular wall thickness. Right ventricular systolic function is normal. Left Atrium: Left atrial size was mildly dilated. Right Atrium: Right atrial size was normal in size. Pericardium: There is no evidence of pericardial effusion. Tricuspid Valve: Tricuspid valve regurgitation is mild. Aortic Valve: Pre TAVR: tri leaflet valve severe calcification and stenosis Calcificatio extends into LVOT. Mild AR Peak velocity 3.9 m/sec peak gradient 60 mmHg mean 37 mmHg AVA O.91 cm2 Post TAVR: well positioned 26 mm Sapien 3 valve. Trivial PVL peak velocity 1.8 m/sec mean gradient 6 mmHg peak 13 mmHg AVA 2.5 cm2. The aortic valve has been repaired/replaced. Aortic valve mean gradient measures 22.0 mmHg. Aortic valve peak gradient measures 30.0 mmHg. Aortic valve area, by VTI measures 1.08 cm. There is a 26 mm Edwards Sapien prosthetic, stented (TAVR) valve present in the aortic position. Procedure Date: 04/15/2020. Pulmonic Valve: The pulmonic valve was not assessed. IAS/Shunts: The interatrial septum was not assessed.  LEFT VENTRICLE PLAX 2D LVOT diam:     2.20 cm LV SV:         81 LV SV Index:   43 LVOT Area:     3.80 cm  AORTIC VALVE AV Area (Vmax):    1.40 cm AV Area (Vmean):   1.37 cm AV Area (VTI):     1.08 cm AV Vmax:           274.00 cm/s AV Vmean:          201.000 cm/s AV VTI:            0.752 m AV Peak Grad:      30.0 mmHg AV Mean Grad:      22.0 mmHg LVOT Vmax:         101.00 cm/s LVOT Vmean:        72.400 cm/s LVOT VTI:          0.214 m LVOT/AV VTI ratio: 0.28  SHUNTS Systemic VTI:  0.21 m Systemic Diam: 2.20 cm Jenkins Rouge MD Electronically signed by Jenkins Rouge MD Signature Date/Time: 04/15/2020/2:09:10 PM    Final    Structural Heart Procedure  Result Date: 04/15/2020 See full operative note in the Notes section under CV Procedures 26 mm Edwards Sapien 3 valve  deployed from the right femoral approach.    Cardiac Studies     Patient Profile     86 y.o. male with baseline LBBB and severe AS who  underwent TAVR on 04/15/20   Assessment & Plan    1. Severe AS: s/p TAVR with placement of a 26 mm Edwards Sapien 3 THV from the right femoral approach. Groins stable. Continue ASA and Plavix. Echo today to assess valve.   2. Complete heart block: Currently pacing. Will ask our EP team to see him today to discuss a pacemaker. NPO now. He did have breakfast.   For questions or updates, please contact [place] Please consult [email or web address] for contact info under        Signed, Lauree Chandler, MD  04/16/2020, 8:15 AM

## 2020-04-16 NOTE — H&P (Signed)
Corey Aguilar has presented today for surgery, with the diagnosis of complete AV block.  The various methods of treatment have been discussed with the patient and family. After consideration of risks, benefits and other options for treatment, the patient has consented to  Procedure(s): Pacemaker implant as a surgical intervention .  Risks include but not limited to bleeding, tamponade, infection, pneumothorax, among others. The patient's history has been reviewed, patient examined, no change in status, stable for surgery.  I have reviewed the patient's chart and labs.  Questions were answered to the patient's satisfaction.    Kasai Beltran Curt Bears, MD 04/16/2020 4:17 PM

## 2020-04-17 ENCOUNTER — Inpatient Hospital Stay (HOSPITAL_COMMUNITY): Payer: PPO

## 2020-04-17 ENCOUNTER — Encounter (HOSPITAL_COMMUNITY): Payer: Self-pay | Admitting: Cardiology

## 2020-04-17 DIAGNOSIS — I5033 Acute on chronic diastolic (congestive) heart failure: Secondary | ICD-10-CM

## 2020-04-17 DIAGNOSIS — Z950 Presence of cardiac pacemaker: Secondary | ICD-10-CM | POA: Diagnosis present

## 2020-04-17 LAB — BASIC METABOLIC PANEL
Anion gap: 9 (ref 5–15)
BUN: 15 mg/dL (ref 8–23)
CO2: 23 mmol/L (ref 22–32)
Calcium: 8.5 mg/dL — ABNORMAL LOW (ref 8.9–10.3)
Chloride: 105 mmol/L (ref 98–111)
Creatinine, Ser: 0.83 mg/dL (ref 0.61–1.24)
GFR calc Af Amer: >60 mL/min (ref >60)
GFR calc non Af Amer: >60 mL/min (ref >60)
Glucose, Bld: 119 mg/dL — ABNORMAL HIGH (ref 70–99)
Potassium: 3.9 mmol/L (ref 3.5–5.1)
Sodium: 137 mmol/L (ref 135–145)

## 2020-04-17 LAB — CBC
HCT: 38.2 % — ABNORMAL LOW (ref 39.0–52.0)
HCT: 38.6 % — ABNORMAL LOW (ref 39.0–52.0)
Hemoglobin: 12.6 g/dL — ABNORMAL LOW (ref 13.0–17.0)
Hemoglobin: 12.9 g/dL — ABNORMAL LOW (ref 13.0–17.0)
MCH: 30.5 pg (ref 26.0–34.0)
MCH: 30.7 pg (ref 26.0–34.0)
MCHC: 33 g/dL (ref 30.0–36.0)
MCHC: 33.4 g/dL (ref 30.0–36.0)
MCV: 91.9 fL (ref 80.0–100.0)
MCV: 92.5 fL (ref 80.0–100.0)
Platelets: 127 10*3/uL — ABNORMAL LOW (ref 150–400)
Platelets: 127 10*3/uL — ABNORMAL LOW (ref 150–400)
RBC: 4.13 MIL/uL — ABNORMAL LOW (ref 4.22–5.81)
RBC: 4.2 MIL/uL — ABNORMAL LOW (ref 4.22–5.81)
RDW: 13 % (ref 11.5–15.5)
RDW: 13.1 % (ref 11.5–15.5)
WBC: 9.3 10*3/uL (ref 4.0–10.5)
WBC: 9.7 10*3/uL (ref 4.0–10.5)
nRBC: 0 % (ref 0.0–0.2)
nRBC: 0 % (ref 0.0–0.2)

## 2020-04-17 LAB — ECHOCARDIOGRAM LIMITED
Height: 67 in
Weight: 2797.2 oz

## 2020-04-17 MED ORDER — CLOPIDOGREL BISULFATE 75 MG PO TABS: 75.0000 mg | ORAL_TABLET | Freq: Every day | ORAL | 1 refills | Status: DC

## 2020-04-17 MED ORDER — ASPIRIN 81 MG PO CHEW: 81.0000 mg | CHEWABLE_TABLET | Freq: Every day | ORAL | Status: DC

## 2020-04-17 MED FILL — Gentamicin Sulfate Inj 40 MG/ML: INTRAMUSCULAR | Qty: 80 | Status: AC

## 2020-04-17 NOTE — Progress Notes (Addendum)
Electrophysiology Rounding Note  Patient Name: Corey Aguilar Date of Encounter: 04/17/2020  Primary Cardiologist: Bronson Ing Electrophysiologist: Curt Bears   Subjective   The patient is doing well today.  At this time, the patient denies chest pain, shortness of breath, or any new concerns.  Inpatient Medications    Scheduled Meds:  aspirin  81 mg Oral Daily   Chlorhexidine Gluconate Cloth  6 each Topical Daily   clopidogrel  75 mg Oral Q breakfast   Continuous Infusions:  sodium chloride     nitroGLYCERIN     phenylephrine (NEO-SYNEPHRINE) Adult infusion     PRN Meds: sodium chloride, acetaminophen **OR** acetaminophen, morphine injection, ondansetron (ZOFRAN) IV, oxyCODONE, traMADol   Vital Signs    Vitals:   04/16/20 1909 04/16/20 1943 04/17/20 0420 04/17/20 0429  BP: 138/87 (!) 142/81 (!) 145/88   Pulse: (!) 0 84 83   Resp: 10 (!) 23 19   Temp:  98.3 F (36.8 C) 98.5 F (36.9 C)   TempSrc:  Oral Oral   SpO2: 91% 92% 93%   Weight:    79.2 kg  Height:        Intake/Output Summary (Last 24 hours) at 04/17/2020 0821 Last data filed at 04/17/2020 0532 Gross per 24 hour  Intake 360 ml  Output 1400 ml  Net -1040 ml   Filed Weights   04/15/20 1027 04/16/20 0500 04/17/20 0429  Weight: 77.1 kg 79.3 kg 79.2 kg    Physical Exam    GEN- The patient is elderly appearing, alert and oriented x 3 today.   Head- normocephalic, atraumatic Eyes-  Sclera clear, conjunctiva pink Ears- hearing intact Oropharynx- clear Neck- supple Lungs- Clear to ausculation bilaterally, normal work of breathing Heart- Regular rate and rhythm (paced) GI- soft, NT, ND, + BS Extremities- no clubbing, cyanosis, or edema Skin- no rash or lesion Psych- euthymic mood, full affect Neuro- strength and sensation are intact  Labs    CBC Recent Labs    04/16/20 2338 04/17/20 0352  WBC 9.3 9.7  HGB 12.9* 12.6*  HCT 38.6* 38.2*  MCV 91.9 92.5  PLT 127* 132*   Basic Metabolic  Panel Recent Labs    04/16/20 0347 04/17/20 0352  NA 138 137  K 4.0 3.9  CL 108 105  CO2 22 23  GLUCOSE 108* 119*  BUN 16 15  CREATININE 0.78 0.83  CALCIUM 8.3* 8.5*  MG 1.7  --      Telemetry    SR with V pacing (personally reviewed)  Radiology    EP PPM/ICD IMPLANT  Result Date: 04/16/2020 SURGEON:  Allegra Lai, MD   PREPROCEDURE DIAGNOSIS:  Complete AV block   POSTPROCEDURE DIAGNOSIS:  Complete AV block    PROCEDURES:  1. Pacemaker implantation.  2. Left upper extremity venography.   INTRODUCTION:  AYAN Aguilar is a 86 y.o. male with a history of bradycardia who presents today for pacemaker implantation.  The patient reports intermittent episodes of dizziness over the past few months.  No reversible causes have been identified.  The patient therefore presents today for pacemaker implantation.   DESCRIPTION OF PROCEDURE:  Informed written consent was obtained, and  the patient was brought to the electrophysiology lab in a fasting state.  The patient required no sedation for the procedure today.  The patients left chest was prepped and draped in the usual sterile fashion by the EP lab staff. The skin overlying the left deltopectoral region was infiltrated with lidocaine for local analgesia.  A 4-cm incision was made over the left deltopectoral region.  A left subcutaneous pacemaker pocket was fashioned using a combination of sharp and blunt dissection. Electrocautery was required to assure hemostasis.  Left Upper Extremity Venography: A venogram of the left upper extremity was performed, which revealed a large left cephalic vein, which emptied into a large left subclavian vein.  The left axillary vein was moderate in size.  RA/RV Lead Placement: The left axillary vein was therefore cannulated.  Through the left axillary vein, a [employer] model Tendril MRI [id number] (serial number  [id number]) right atrial lead and a [employer] model Tendril MRI [id number] (serial number   [id number]) right ventricular lead were advanced with fluoroscopic visualization into the right atrial appendage and right ventricular apex positions respectively.  Initial atrial lead P- waves measured 3.2 mV with impedance of 597 ohms and a threshold of 0.9 V at 0.5 msec.  Right ventricular lead R-waves measured 7.3 mV with an impedance of 772 ohms and a threshold of 0.9 V at 0.5 msec.  Both leads were secured to the pectoralis fascia using #2-0 silk over the suture sleeves. Device Placement:  The leads were then connected to a [place] MRI  model [id number] (serial number  [id number] ) pacemaker.  The pocket was irrigated with copious gentamicin solution.  The pacemaker was then placed into the pocket.  The pocket was then closed in 3 layers with 2.0 Vicryl suture for the 3.0 Vicryl suture subcutaneous and subcuticular layers.  Steri-  Strips and a sterile dressing were then applied. EBL<13ml.  There were no early apparent complications.   CONCLUSIONS:  1. Successful implantation of a [employer] Medical Assurity MRI dual-chamber pacemaker for symptomatic bradycardia  2. No early apparent complications.       Allegra Lai, MD 04/16/2020 7:00 PM  DG Chest Port 1 View  Result Date: 04/15/2020 CLINICAL DATA:  Status post TAVR. EXAM: PORTABLE CHEST 1 VIEW COMPARISON:  Preoperative radiograph 04/11/2020.  CT 04/01/2020 FINDINGS: Interval TAVR. Lower lung volumes from prior exam. Upper normal heart size likely accentuated by technique. Aortic atherosclerosis. Unchanged mediastinal contours. Mild elevation of right hemidiaphragm. No pulmonary edema. No pneumothorax or significant pleural effusion. No focal airspace disease. Degenerative change in the shoulders and spine. Overlying EKG leads. Overlying pacing device from an inferior approach versus EKG lead projecting over the lower heart border. IMPRESSION: 1. Post TAVR without immediate postoperative complication 2.  Aortic Atherosclerosis (ICD10-I70.0).  Electronically Signed   By: Keith Rake M.D.   On: 04/15/2020 17:58   ECHOCARDIOGRAM LIMITED  Result Date: 04/15/2020    ECHOCARDIOGRAM LIMITED REPORT   Patient Name:   Corey Aguilar Date of Exam: 04/15/2020 Medical Rec #:  [id number]         Height:       67.0 in Accession #:    [id number]        Weight:       170.0 lb Date of Birth:  1934/04/11          BSA:          1.887 m Patient Age:    79 years          BP:           172/74 mmHg Patient Gender: M                 HR:           63 bpm. Exam Location:  Inpatient Procedure: Limited Echo, Limited Color Doppler and Cardiac Doppler Indications:    Aortic valve stenosis 424.1 / 135.0  History:        Patient has prior history of Echocardiogram examinations.                 Arrythmias:LBBB; Signs/Symptoms:Murmur and Dyspnea. Multiple                 skin cancers.                 Aortic Valve: 26 mm Edwards Sapien prosthetic, stented (TAVR)                 valve is present in the aortic position. Procedure Date:                 04/15/2020.  Sonographer:    Darlina Sicilian RDCS Referring Phys: [place]  1. Left ventricular ejection fraction, by estimation, is 50 to 55%. The left ventricle has low normal function. There is mild left ventricular hypertrophy.  2. Right ventricular systolic function is normal. The right ventricular size is normal.  3. Left atrial size was mildly dilated.  4. Pre TAVR: tri leaflet valve severe calcification and stenosis Calcificatio extends into LVOT. Mild AR Peak velocity 3.9 m/sec peak gradient 60 mmHg mean 37 mmHg AVA O.91 cm2         Post TAVR: well positioned 26 mm Sapien 3 valve. Trivial PVL peak velocity 1.8 m/sec mean gradient 6 mmHg peak 13 mmHg AVA 2.5 cm2 . The aortic valve has been repaired/replaced. There is a 26 mm Edwards Sapien prosthetic (TAVR) valve present in the aortic position. Procedure Date: 04/15/2020. FINDINGS  Left Ventricle: Left ventricular ejection fraction, by estimation, is  50 to 55%. The left ventricle has low normal function. The left ventricular internal cavity size was normal in size. There is mild left ventricular hypertrophy. Right Ventricle: The right ventricular size is normal. No increase in right ventricular wall thickness. Right ventricular systolic function is normal. Left Atrium: Left atrial size was mildly dilated. Right Atrium: Right atrial size was normal in size. Pericardium: There is no evidence of pericardial effusion. Tricuspid Valve: Tricuspid valve regurgitation is mild. Aortic Valve: Pre TAVR: tri leaflet valve severe calcification and stenosis Calcificatio extends into LVOT. Mild AR Peak velocity 3.9 m/sec peak gradient 60 mmHg mean 37 mmHg AVA O.91 cm2 Post TAVR: well positioned 26 mm Sapien 3 valve. Trivial PVL peak velocity 1.8 m/sec mean gradient 6 mmHg peak 13 mmHg AVA 2.5 cm2. The aortic valve has been repaired/replaced. Aortic valve mean gradient measures 22.0 mmHg. Aortic valve peak gradient measures 30.0 mmHg. Aortic valve area, by VTI measures 1.08 cm. There is a 26 mm Edwards Sapien prosthetic, stented (TAVR) valve present in the aortic position. Procedure Date: 04/15/2020. Pulmonic Valve: The pulmonic valve was not assessed. IAS/Shunts: The interatrial septum was not assessed.  LEFT VENTRICLE PLAX 2D LVOT diam:     2.20 cm LV SV:         81 LV SV Index:   43 LVOT Area:     3.80 cm  AORTIC VALVE AV Area (Vmax):    1.40 cm AV Area (Vmean):   1.37 cm AV Area (VTI):     1.08 cm AV Vmax:           274.00 cm/s AV Vmean:          201.000 cm/s AV VTI:  0.752 m AV Peak Grad:      30.0 mmHg AV Mean Grad:      22.0 mmHg LVOT Vmax:         101.00 cm/s LVOT Vmean:        72.400 cm/s LVOT VTI:          0.214 m LVOT/AV VTI ratio: 0.28  SHUNTS Systemic VTI:  0.21 m Systemic Diam: 2.20 cm Jenkins Rouge MD Electronically signed by Jenkins Rouge MD Signature Date/Time: 04/15/2020/2:09:10 PM    Final    Structural Heart Procedure  Result Date:  04/15/2020 See full operative note in the Notes section under CV Procedures 26 mm Edwards Sapien 3 valve deployed from the right femoral approach.     Assessment & Plan    1.  Complete heart block Doing well s/p PPM Normal device function CXR with leads in stable position, no obvious ptx Routine wound care and follow up  2.  Severe AS s/p TAVR Per primary team   For questions or updates, please contact [place] Please consult [email or web address] for contact info under Cardiology/STEMI.  Signed, Chanetta Marshall, NP  04/17/2020, 8:21 AM   I have seen and examined this patient with Chanetta Marshall.  Agree with above, note added to reflect my findings.  On exam, RRR, no murmurs, lungs clear.  She is now status post [employer] dual-chamber pacemaker for 2-1 AV block post TAVR.  Device functioning appropriately.  Chest x-ray and interrogation without issue.  Acceptable for discharge today with follow-up in device clinic.  Giulliana Mcroberts M. Lenvil Swaim MD 04/17/2020 8:59 AM

## 2020-04-17 NOTE — Progress Notes (Signed)
CARDIAC REHAB PHASE I   PRE:  Rate/Rhythm: 86 paced  BP:  Sitting: 112/79      SaO2: 96 RA  MODE:  Ambulation: 450 ft   POST:  Rate/Rhythm: 107 ?paced  BP:  Sitting: 151/87    SaO2: 94 RA  Pt ambulated 446ft in hallway assist of one with front wheel walker. Pt denies pain, SOB, or dizziness. Pt returned to recliner. Reviewed site care, and restrictions. Encouraged ambulation as able. Pt states has has a walker for home use if needed. Pt declining CRP II at this time.  [id number] Rufina Falco, RN BSN 04/17/2020 11:26 AM

## 2020-04-17 NOTE — Discharge Summary (Addendum)
[facility] VALVE TEAM  Discharge Summary    Patient ID: Corey Aguilar MRN: [id number]; DOB: Jan 02, 1934  Admit date: 04/15/2020 Discharge date: 04/17/2020  Primary Care Provider: Monico Blitz, MD  Primary Cardiologist: Kate Sable, MD / Dr. Buena Irish & Dr. Roxy Manns (TAVR)  Discharge Diagnoses    Principal Problem:   S/P TAVR (transcatheter aortic valve replacement) Active Problems:   Overweight (BMI 25.0-29.9)   Severe aortic stenosis   LBBB (left bundle branch block)   History of kidney stones   Acute on chronic diastolic heart failure (HCC)   S/P placement of cardiac pacemaker   Allergies Allergies  Allergen Reactions  . Penicillins     Did it involve swelling of the face/tongue/throat, SOB, or low BP? No Did it involve sudden or severe rash/hives, skin peeling, or any reaction on the inside of your mouth or nose? Yes Did you need to seek medical attention at a hospital or doctor's office? No When did it last happen?childhood If all above answers are "NO", may proceed with cephalosporin use.  . Ibuprofen Rash    Diagnostic Studies/Procedures    TAVR OPERATIVE NOTE   Date of Procedure:                04/15/2020  Preoperative Diagnosis:      Severe Aortic Stenosis   Postoperative Diagnosis:    Same   Procedure:        Transcatheter Aortic Valve Replacement - Percutaneous Right Transfemoral Approach             Edwards Sapien 3 Ultra THV (size 26 mm, model # [id number], serial # [id number])              Co-Surgeons:                        Lauree Chandler, MD and Valentina Gu. Roxy Manns, MD  Anesthesiologist:                  Roberts Gaudy, MD  Echocardiographer:              Jenkins Rouge, MD  Pre-operative Echo Findings: ? Severe aortic stenosis ? Normal left ventricular systolic function  Post-operative Echo Findings: ? Mild paravalvular leak ? Normal left ventricular systolic  function  _____________   Echo 04/16/20: pending formal read.   History of Present Illness     Corey Aguilar is a 86 y.o. male with a history of multiple skin cancers, nephrolithiasis,LBBB, 1st degree AV blockand severe aortic stenosis who presented to [facility] on 04/16/20 for planned TAVR.   He had an echocardiogram in outside facility in December 2020 showing severe aortic stenosis and mild aortic insufficiency with normal left ventricular ejection fraction.  He was referred to Dr. Angelena Form but denied any symptoms and plans were made for follow-up echocardiogram in 3 months.  His wife called the office on 03/18/2020 reporting that he has been having more symptoms with dizziness and feeling like he might pass out.  She felt like he may be having some chest discomfort and shortness of breath when lying down. He had a follow-up echocardiogram on 03/19/2020 which showed severe aortic stenosis with a mean gradient of 44.5 mmHg and a peak gradient of 71.1 mmHg.  Aortic valve area was 0.73 cm.  Aortic insufficiency was mild with a pressure half-time of 389 ms.  There was mild mitral stenosis with a peak gradient of 11.7 mmHg.  The mean gradient was 4 mmHg. There is trivial mitral regurgitation.  Left ventricular ejection fraction was 50 to 55%.  He subsequently underwent cardiac catheterization on 03/27/2020 which showed moderate nonobstructive disease in the mid LAD, second Diagonal branch and first obtuse marginal branch. Moderately severe stenosis in the small to moderate caliber, non-dominant RCA. Medical therapy was recommended.   The patient has been evaluated by the multidisciplinary valve team and felt to have severe, symptomatic aortic stenosis and to be a suitable candidate for TAVR, which was set up for 04/15/20.   Hospital Course     Consultants: electrophysiology   Severe AS: s/p successful TAVR with a 26 mm Edwards Sapien 3 THV Ultra via the TF approach on 04/16/20. Post operative echo completed  but pending formal read. Groin sites are stable. Post op course c/b CHB requiring pacemaker placement. Started on DAPT with Asprin and Plavix. Plan for discharge home today with follow up in the office for wound check and visit with me 6/24.  CHB: patient had a baseline LBBB and 1st degree AV block. He developed CHB shortly after valve deployment. His temp wire was left in place. He initially regained back intrinsic rhythm but eventually had recurrent CHB. He underwent successful PPM with a [place] [facility] on 04/16/20 with Dr. Curt Bears.   Acute on chronic diastolic CHF: as evidenced by an elevated BNP on pre admission lab work and progressive symptoms of dyspnea. This has been treated with TAVR. He has been resumed on home lasix 20mg  daily.   TAA: pre TAVR CT showed ectasia of the ascending thoracic aorta (4.1 cm in diameter). Recommend annual imaging followup by CTA or MRA. This can be followed over time.   Pulmonary nodule: pre TAVT CT showed a 4 mm subpleural nodule in the right lower lobe, nonspecific, but statistically likely benign. No follow-up needed if patient is low-risk. Non-contrast chest CT can be considered in 12 months if patient is high-risk. This will be discussed in the outpatient setting.   _____________  Discharge Vitals Blood pressure 113/74, pulse 86, temperature (!) 97.4 F (36.3 C), temperature source Oral, resp. rate (!) 22, height 5\' 7"  (1.702 m), weight 79.2 kg, SpO2 93 %.  Filed Weights   04/15/20 1027 04/16/20 0500 04/17/20 0429  Weight: 77.1 kg 79.3 kg 79.2 kg    GEN: Well nourished, well developed, in no acute distress HEENT: normal Neck: no JVD or masses Cardiac: RRR; no murmurs, rubs, or gallops,no edema  Respiratory:  clear to auscultation bilaterally, normal work of breathing GI: soft, nontender, nondistended, + BS MS: no deformity or atrophy Skin: warm and dry, no rash  Groin sites clear without hematoma or ecchymosis. Pacemaker site with mild  hematoma.  Neuro:  Alert and Oriented x 3, Strength and sensation are intact Psych: euthymic mood, full affect    Labs & Radiologic Studies    CBC Recent Labs    04/16/20 2338 04/17/20 0352  WBC 9.3 9.7  HGB 12.9* 12.6*  HCT 38.6* 38.2*  MCV 91.9 92.5  PLT 127* 381*   Basic Metabolic Panel Recent Labs    04/16/20 0347 04/17/20 0352  NA 138 137  K 4.0 3.9  CL 108 105  CO2 22 23  GLUCOSE 108* 119*  BUN 16 15  CREATININE 0.78 0.83  CALCIUM 8.3* 8.5*  MG 1.7  --    Liver Function Tests No results for input(s): AST, ALT, ALKPHOS, BILITOT, PROT, ALBUMIN in the last 72 hours. No  results for input(s): LIPASE, AMYLASE in the last 72 hours. Cardiac Enzymes No results for input(s): CKTOTAL, CKMB, CKMBINDEX, TROPONINI in the last 72 hours. BNP Invalid input(s): POCBNP D-Dimer No results for input(s): DDIMER in the last 72 hours. Hemoglobin A1C No results for input(s): HGBA1C in the last 72 hours. Fasting Lipid Panel No results for input(s): CHOL, HDL, LDLCALC, TRIG, CHOLHDL, LDLDIRECT in the last 72 hours. Thyroid Function Tests No results for input(s): TSH, T4TOTAL, T3FREE, THYROIDAB in the last 72 hours.  Invalid input(s): FREET3 _____________  DG Chest 2 View  Result Date: 04/11/2020 CLINICAL DATA:  Preop for transcatheter aortic valve repair. EXAM: CHEST - 2 VIEW COMPARISON:  None. FINDINGS: The heart size and mediastinal contours are within normal limits. Both lungs are clear. No pneumothorax or pleural effusion is noted. Elevated right hemidiaphragm is noted with probable eventration seen posteriorly. The visualized skeletal structures are unremarkable. IMPRESSION: No active cardiopulmonary disease. Electronically Signed   By: Marijo Conception M.D.   On: 04/11/2020 16:51   CARDIAC CATHETERIZATION  Result Date: 03/27/2020  1st Mrg lesion is 50% stenosed.  Ost Cx to Prox Cx lesion is 20% stenosed.  Prox LAD lesion is 30% stenosed.  1st Diag lesion is 60% stenosed.   2nd Diag lesion is 60% stenosed.  Mid LAD lesion is 60% stenosed.  Prox RCA lesion is 70% stenosed.  1. Moderate non-obstructive disease in the mid LAD, second Diagonal branch and first obtuse marginal branch. 2. Moderately severe stenosis in the small to moderate caliber, non-dominant RCA 3. Severe aortic stenosis by echo. I did not cross the aortic valve today. Recommendations: Will continue workup for TAVR. Medical management of non-obstructive CAD.   EP PPM/ICD IMPLANT  Result Date: 04/16/2020 SURGEON:  Allegra Lai, MD   PREPROCEDURE DIAGNOSIS:  Complete AV block   POSTPROCEDURE DIAGNOSIS:  Complete AV block    PROCEDURES:  1. Pacemaker implantation.  2. Left upper extremity venography.   INTRODUCTION:  Corey Aguilar is a 85 y.o. male with a history of bradycardia who presents today for pacemaker implantation.  The patient reports intermittent episodes of dizziness over the past few months.  No reversible causes have been identified.  The patient therefore presents today for pacemaker implantation.   DESCRIPTION OF PROCEDURE:  Informed written consent was obtained, and  the patient was brought to the electrophysiology lab in a fasting state.  The patient required no sedation for the procedure today.  The patients left chest was prepped and draped in the usual sterile fashion by the EP lab staff. The skin overlying the left deltopectoral region was infiltrated with lidocaine for local analgesia.  A 4-cm incision was made over the left deltopectoral region.  A left subcutaneous pacemaker pocket was fashioned using a combination of sharp and blunt dissection. Electrocautery was required to assure hemostasis.  Left Upper Extremity Venography: A venogram of the left upper extremity was performed, which revealed a large left cephalic vein, which emptied into a large left subclavian vein.  The left axillary vein was moderate in size.  RA/RV Lead Placement: The left axillary vein was therefore cannulated.   Through the left axillary vein, a [employer] model Tendril MRI [id number] (serial number  [id number]) right atrial lead and a [employer] model Tendril MRI [id number] (serial number  [id number]) right ventricular lead were advanced with fluoroscopic visualization into the right atrial appendage and right ventricular apex positions respectively.  Initial atrial lead P- waves measured 3.2 mV with impedance  of 597 ohms and a threshold of 0.9 V at 0.5 msec.  Right ventricular lead R-waves measured 7.3 mV with an impedance of 772 ohms and a threshold of 0.9 V at 0.5 msec.  Both leads were secured to the pectoralis fascia using #2-0 silk over the suture sleeves. Device Placement:  The leads were then connected to a [employer] MRI  model [id number] (serial number  [id number] ) pacemaker.  The pocket was irrigated with copious gentamicin solution.  The pacemaker was then placed into the pocket.  The pocket was then closed in 3 layers with 2.0 Vicryl suture for the 3.0 Vicryl suture subcutaneous and subcuticular layers.  Steri-  Strips and a sterile dressing were then applied. EBL<22ml.  There were no early apparent complications.   CONCLUSIONS:  1. Successful implantation of a [employer] Medical Assurity MRI dual-chamber pacemaker for symptomatic bradycardia  2. No early apparent complications.       Will Curt Bears, MD 04/16/2020 7:00 PM  CT CORONARY MORPH W/CTA COR W/SCORE W/CA W/CM &/OR WO/CM  Addendum Date: 04/01/2020   ADDENDUM REPORT: 04/01/2020 14:24 ADDENDUM: Extracardiac findings were described separately under dictation for contemporaneously obtained CTA chest, abdomen and pelvis dated 04/01/2020. Please see that dictation for full description of relevant extracardiac findings. Electronically Signed   By: Vinnie Langton M.D.   On: 04/01/2020 14:24   Result Date: 04/01/2020 CLINICAL DATA:  Aortic Stenosis EXAM: Cardiac TAVR CT TECHNIQUE: The patient was scanned on a Siemens Force 354 slice scanner. A 120 kV  retrospective scan was triggered in the ascending thoracic aorta at 140 HU's. Gantry rotation speed was 250 msecs and collimation was .6 mm. No beta blockade or nitro were given. The 3D data set was reconstructed in 5% intervals of the R-R cycle. Systolic and diastolic phases were analyzed on a dedicated work station using MPR, MIP and VRT modes. The patient received 80 cc of contrast. FINDINGS: Aortic Valve: Tri leaflet calcified with restricted leaflet motion Aorta: Mild aortic root dilatation. Moderate calcific atherosclerosis Tortuous left subclavian Sino-tubular Junction: 28 mm Ascending Thoracic Aorta: 38 mm Aortic Arch: 31 mm Descending Thoracic Aorta: 27 mm Sinus of Valsalva Measurements: Non-coronary: 32.6 mm Right - coronary: 31.1 mm Left -   coronary: 32.9 mm Coronary Artery Height above Annulus: Left Main: 13.9 mm above annulus Right Coronary: RCA 11.9 mm above annulus Virtual Basal Annulus Measurements: Maximum / Minimum Diameter: 24.5 mm x 23.3 mm Perimeter: 75 mm c Area: 451 mm 2 Coronary Arteries: Sufficient height above annulus for deployment Optimum Fluoroscopic Angle for Delivery: LAO 15 Cranial 2 degrees IMPRESSION: 1. Tri leaflet AV with annular area of 451 mm 2 suitable for a 26 mm Sapien 2 valve. Note there is bulky leaflet calcium and annular calcium at the base of the non coronary cusp extending into the base of the mitral leaflet that may increase risk of PVL and heart block 2. Mild Aortic root dilatation 3.8 cm Tortuous left subclavian probably not suitable for access point 3. Optimum angiographic angle for deployment LAO 15 cranial 2 degrees 4.  Coronary arteries sufficient height above annulus for deployment Jenkins Rouge Electronically Signed: By: Jenkins Rouge M.D. On: 04/01/2020 12:39   DG Chest Port 1 View  Result Date: 04/15/2020 CLINICAL DATA:  Status post TAVR. EXAM: PORTABLE CHEST 1 VIEW COMPARISON:  Preoperative radiograph 04/11/2020.  CT 04/01/2020 FINDINGS: Interval TAVR.  Lower lung volumes from prior exam. Upper normal heart size likely accentuated by technique. Aortic atherosclerosis.  Unchanged mediastinal contours. Mild elevation of right hemidiaphragm. No pulmonary edema. No pneumothorax or significant pleural effusion. No focal airspace disease. Degenerative change in the shoulders and spine. Overlying EKG leads. Overlying pacing device from an inferior approach versus EKG lead projecting over the lower heart border. IMPRESSION: 1. Post TAVR without immediate postoperative complication 2.  Aortic Atherosclerosis (ICD10-I70.0). Electronically Signed   By: Keith Rake M.D.   On: 04/15/2020 17:58   CT ANGIO CHEST AORTA W/CM & OR WO/CM  Result Date: 04/01/2020 CLINICAL DATA:  86 year old male with history of severe aortic stenosis. Preprocedural study prior to potential transcatheter aortic valve replacement (TAVR) procedure. EXAM: CT ANGIOGRAPHY CHEST, ABDOMEN AND PELVIS TECHNIQUE: Non-contrast CT of the chest was initially obtained. Multidetector CT imaging through the chest, abdomen and pelvis was performed using the standard protocol during bolus administration of intravenous contrast. Multiplanar reconstructed images and MIPs were obtained and reviewed to evaluate the vascular anatomy. CONTRAST:  123mL OMNIPAQUE IOHEXOL 350 MG/ML SOLN COMPARISON:  CT the abdomen and pelvis 06/01/2018. FINDINGS: CTA CHEST FINDINGS Cardiovascular: Heart size is normal. There is no significant pericardial fluid, thickening or pericardial calcification. There is aortic atherosclerosis, as well as atherosclerosis of the great vessels of the mediastinum and the coronary arteries, including calcified atherosclerotic plaque in the left main, left anterior descending, left circumflex and right coronary arteries. Ectasia of the ascending thoracic aorta (4.1 cm in diameter). Severe thickening and calcification of the aortic valve. Mediastinum/Lymph Nodes: No pathologically enlarged mediastinal or  hilar lymph nodes. Esophagus is unremarkable in appearance. No axillary lymphadenopathy. Lungs/Pleura: 4 mm right lower lobe subpleural pulmonary nodule (axial image 63 of series 8). No other larger more suspicious appearing pulmonary nodules or masses are noted. No acute consolidative airspace disease. No pleural effusions. Musculoskeletal/Soft Tissues: There are no aggressive appearing lytic or blastic lesions noted in the visualized portions of the skeleton. CTA ABDOMEN AND PELVIS FINDINGS Hepatobiliary: Subcentimeter low-attenuation lesion in the central aspect of segment 8 of the liver, too small to characterize, but statistically likely to represent a tiny cyst. No other suspicious hepatic lesions. No intra or extrahepatic biliary ductal dilatation. Gallbladder is normal in appearance. Pancreas: No pancreatic mass. No pancreatic ductal dilatation. No pancreatic or peripancreatic fluid collections or inflammatory changes. Spleen: Unremarkable. Adrenals/Urinary Tract: 2.2 cm low-attenuation lesion in the interpolar region of the left kidney, compatible with a simple cyst. Multiple other parapelvic lesions associated with the kidneys bilaterally (left greater than right), compatible with peripelvic cysts. Bilateral adrenal glands are normal in appearance. No hydroureteronephrosis. Urinary bladder is partially obscured by beam hardening artifact from the patient's left hip arthroplasty, but visualized portions are unremarkable in appearance. Stomach/Bowel: Normal appearance of the stomach. No pathologic dilatation of small bowel or colon. Numerous colonic diverticulae are noted, particularly in the sigmoid colon, without surrounding inflammatory changes to suggest an acute diverticulitis at this time. Normal appendix. Vascular/Lymphatic: Aortic atherosclerosis, with vascular findings and measurements pertinent to potential TAVR procedure, as detailed below. Aneurysmal dilatation of the common iliac arteries  bilaterally (2.2 cm on the right and 2.3 cm on the left). No lymphadenopathy noted in the abdomen or pelvis. Reproductive: Prostate gland and seminal vesicles are largely obscured by beam hardening artifact from the patient's left hip arthroplasty. Other: No significant volume of ascites.  No pneumoperitoneum. Musculoskeletal: Status post PLIF from L1 through S1. Status post left hip arthroplasty. There are no aggressive appearing lytic or blastic lesions noted in the visualized portions of the skeleton. VASCULAR MEASUREMENTS PERTINENT  TO TAVR: AORTA: Minimal Aortic Diameter-16 x 16 mm Severity of Aortic Calcification-moderate to severe RIGHT PELVIS: Right Common Iliac Artery - Minimal Diameter-11.2 x 9.8 mm Tortuosity-moderate Calcification-moderate Right External Iliac Artery - Minimal Diameter-10.0 x 9.3 mm Tortuosity-extreme Calcification-none Right Common Femoral Artery - Minimal Diameter-10.5 x 9.9 mm Tortuosity-mild Calcification-moderate LEFT PELVIS: Left Common Iliac Artery - Minimal Diameter-13.2 x 11.4 mm Tortuosity-moderate Calcification-moderate Left External Iliac Artery - Minimal Diameter-10.9 x 10.9 mm Tortuosity-extreme Calcification-minimal Left Common Femoral Artery - Minimal Diameter-11.6 x 10.6 mm Tortuosity-mild Calcification-none Review of the MIP images confirms the above findings. IMPRESSION: 1. Vascular findings and measurements pertinent to potential TAVR procedure, as detailed above. 2. Severe thickening calcification of the aortic valve, compatible with reported clinical history of severe aortic stenosis. 3. Aortic atherosclerosis, in addition to left main and 3 vessel coronary artery disease. In addition, there is ectasia of the ascending thoracic aorta (4.1 cm in diameter). Recommend annual imaging followup by CTA or MRA. This recommendation follows 2010 ACCF/AHA/AATS/ACR/ASA/SCA/SCAI/SIR/STS/SVM Guidelines for the Diagnosis and Management of Patients with Thoracic Aortic Disease.  Circulation. 2010; 121: D532-D924. Aortic aneurysm NOS (ICD10-I71.9). 4. There is also aneurysmal dilatation of the common iliac arteries bilaterally. 5. 4 mm subpleural nodule in the right lower lobe, nonspecific, but statistically likely benign. No follow-up needed if patient is low-risk. Non-contrast chest CT can be considered in 12 months if patient is high-risk. This recommendation follows the consensus statement: Guidelines for Management of Incidental Pulmonary Nodules Detected on CT Images: From the Fleischner Society 2017; Radiology 2017; 284:228-243. 6. Severe colonic diverticulosis without evidence of acute diverticulitis at this time. 7. Additional incidental findings, as above. Electronically Signed   By: Vinnie Langton M.D.   On: 04/01/2020 14:22   ECHOCARDIOGRAM COMPLETE  Result Date: 03/19/2020    ECHOCARDIOGRAM REPORT   Patient Name:   Corey Aguilar Date of Exam: 03/19/2020 Medical Rec #:  [id number]       Height:       67.0 in Accession #:    [id number]      Weight:       176.0 lb Date of Birth:  23-Jan-1934        BSA:          1.915 m Patient Age:    71 years        BP:           156/80 mmHg Patient Gender: M               HR:           69 bpm. Exam Location:  [place] Procedure: 2D Echo, 3D Echo, Cardiac Doppler, Color Doppler and Strain Analysis Indications:    I35 Aortic stenosis  History:        Patient has prior history of Echocardiogram examinations, most                 recent 10/29/2019. Risk Factors:Former Smoker.  Sonographer:    Jessee Avers, RDCS Referring Phys: [id number] [place]  1. Left ventricular ejection fraction, by estimation, is 50 to 55%. The left ventricle has low normal function. The left ventricle demonstrates regional wall motion abnormalities (see scoring diagram/findings for description). There is mild left ventricular hypertrophy. Left ventricular diastolic parameters are consistent with Grade I diastolic dysfunction (impaired  relaxation). There is moderate hypokinesis of the left ventricular, basal-mid inferior wall.  2. Right ventricular systolic function is normal. The right ventricular size is normal. There is  normal pulmonary artery systolic pressure.  3. Left atrial size was mildly dilated.  4. The mitral valve is abnormal. Trivial mitral valve regurgitation. Mild mitral stenosis. The mean mitral valve gradient is 4.0 mmHg with average heart rate of 66 bpm.  5. The aortic valve has an indeterminant number of cusps. Aortic valve regurgitation is mild. Severe aortic valve stenosis. Aortic valve area, by VTI measures 0.73 cm. Aortic valve mean gradient measures 44.5 mmHg. Aortic valve Vmax measures 4.22 m/s.  6. The inferior vena cava is normal in size with greater than 50% respiratory variability, suggesting right atrial pressure of 3 mmHg. Comparison(s): Report only 10/29/19 EF 60-65%. Severe AS 60 mmHg peak PG, 42 mmHg mean PG. The decline in gradient may reflect decreased cardiac output. FINDINGS  Left Ventricle: Left ventricular ejection fraction, by estimation, is 50 to 55%. The left ventricle has low normal function. The left ventricle demonstrates regional wall motion abnormalities. Moderate hypokinesis of the left ventricular, basal-mid inferior wall. The left ventricular internal cavity size was normal in size. There is mild left ventricular hypertrophy. Left ventricular diastolic parameters are consistent with Grade I diastolic dysfunction (impaired relaxation). Indeterminate filling pressures. Right Ventricle: The right ventricular size is normal. No increase in right ventricular wall thickness. Right ventricular systolic function is normal. There is normal pulmonary artery systolic pressure. The tricuspid regurgitant velocity is 2.62 m/s, and  with an assumed right atrial pressure of 3 mmHg, the estimated right ventricular systolic pressure is 40.9 mmHg. Left Atrium: Left atrial size was mildly dilated. Right Atrium:  Right atrial size was normal in size. Pericardium: There is no evidence of pericardial effusion. Mitral Valve: The mitral valve is abnormal. Mild to moderate mitral annular calcification. Trivial mitral valve regurgitation. Mild mitral valve stenosis. MV peak gradient, 11.7 mmHg. The mean mitral valve gradient is 4.0 mmHg with average heart rate of 66 bpm. Tricuspid Valve: The tricuspid valve is grossly normal. Tricuspid valve regurgitation is mild. Aortic Valve: The aortic valve has an indeterminant number of cusps. Aortic valve regurgitation is mild. Aortic regurgitation PHT measures 389 msec. Severe aortic stenosis is present. There is severe calcifcation of the aortic valve. Aortic valve mean gradient measures 44.5 mmHg. Aortic valve peak gradient measures 71.1 mmHg. Aortic valve area, by VTI measures 0.73 cm. Pulmonic Valve: The pulmonic valve was normal in structure. Pulmonic valve regurgitation is not visualized. Aorta: The aortic root and ascending aorta are structurally normal, with no evidence of dilitation. Venous: The inferior vena cava is normal in size with greater than 50% respiratory variability, suggesting right atrial pressure of 3 mmHg. IAS/Shunts: No atrial level shunt detected by color flow Doppler.  LEFT VENTRICLE PLAX 2D LVIDd:         5.10 cm  Diastology LVIDs:         3.50 cm  LV e' lateral:   7.15 cm/s LV PW:         1.10 cm  LV E/e' lateral: 12.4 LV IVS:        1.40 cm  LV e' medial:    3.16 cm/s LVOT diam:     2.00 cm  LV E/e' medial:  28.0 LV SV:         86 LV SV Index:   45       2D Longitudinal Strain LVOT Area:     3.14 cm 2D Strain GLS (A2C):   -19.3 %  2D Strain GLS (A3C):   -17.2 %                         2D Strain GLS (A4C):   -16.6 %                         2D Strain GLS Avg:     -17.7 %                          3D Volume EF:                         3D EF:        46 %                         LV EDV:       201 ml                         LV ESV:       110  ml                         LV SV:        92 ml RIGHT VENTRICLE RV Basal diam:  4.00 cm RV S prime:     11.50 cm/s TAPSE (M-mode): 2.5 cm RVSP:           30.5 mmHg LEFT ATRIUM             Index       RIGHT ATRIUM           Index LA diam:        4.40 cm 2.30 cm/m  RA Pressure: 3.00 mmHg LA Vol (A2C):   69.5 ml 36.29 ml/m RA Area:     11.00 cm LA Vol (A4C):   63.4 ml 33.10 ml/m RA Volume:   18.20 ml  9.50 ml/m LA Biplane Vol: 73.2 ml 38.22 ml/m  AORTIC VALVE AV Area (Vmax):    0.80 cm AV Area (Vmean):   0.75 cm AV Area (VTI):     0.73 cm AV Vmax:           421.50 cm/s AV Vmean:          305.200 cm/s AV VTI:            1.170 m AV Peak Grad:      71.1 mmHg AV Mean Grad:      44.5 mmHg LVOT Vmax:         107.00 cm/s LVOT Vmean:        72.700 cm/s LVOT VTI:          0.273 m LVOT/AV VTI ratio: 0.23 AI PHT:            389 msec  AORTA Ao Root diam: 3.70 cm Ao Asc diam:  3.60 cm MITRAL VALVE                TRICUSPID VALVE MV Area (PHT): 4.68 cm     TR Peak grad:   27.5 mmHg MV Peak grad:  11.7 mmHg    TR Vmax:        262.00 cm/s MV Mean grad:  4.0 mmHg     Estimated RAP:  3.00 mmHg MV Vmax:  1.71 m/s     RVSP:           30.5 mmHg MV Vmean:      90.3 cm/s MV Decel Time: 162 msec     SHUNTS MV E velocity: 88.60 cm/s   Systemic VTI:  0.27 m MV A velocity: 138.00 cm/s  Systemic Diam: 2.00 cm MV E/A ratio:  0.64 Lyman Bishop MD Electronically signed by Lyman Bishop MD Signature Date/Time: 03/19/2020/5:45:12 PM    Final    VAS US CAROTID  Result Date: 04/01/2020 Carotid Arterial Duplex Study Indications:       Pre-surgical evaluation (TAVR). Comparison Study:  No prior study Performing Technologist: Maudry Mayhew MHA, RDMS, RVT, RDCS  Examination Guidelines: A complete evaluation includes B-mode imaging, spectral Doppler, color Doppler, and power Doppler as needed of all accessible portions of each vessel. Bilateral testing is considered an integral part of a complete examination. Limited examinations for  reoccurring indications may be performed as noted.  Right Carotid Findings: +----------+--------+-------+--------+--------------------------------+--------+           PSV cm/sEDV    StenosisPlaque Description              Comments                   cm/s                                                    +----------+--------+-------+--------+--------------------------------+--------+ CCA Prox  94      16                                                      +----------+--------+-------+--------+--------------------------------+--------+ CCA Distal101     21             smooth and heterogenous                  +----------+--------+-------+--------+--------------------------------+--------+ ICA Prox  74      26             smooth, heterogenous and                                                  calcific                                 +----------+--------+-------+--------+--------------------------------+--------+ ICA Distal76      24                                             tortuous +----------+--------+-------+--------+--------------------------------+--------+ ECA       90      7                                                       +----------+--------+-------+--------+--------------------------------+--------+ +----------+--------+-------+----------------+-------------------+  PSV cm/sEDV cmsDescribe        Arm Pressure (mmHG) +----------+--------+-------+----------------+-------------------+ [id number]            Multiphasic, WNL                    +----------+--------+-------+----------------+-------------------+ +---------+--------+--+--------+--+---------+ VertebralPSV cm/s72EDV cm/s22Antegrade +---------+--------+--+--------+--+---------+  Left Carotid Findings: +----------+--------+-------+--------+--------------------------------+--------+           PSV cm/sEDV    StenosisPlaque Description              Comments                    cm/s                                                    +----------+--------+-------+--------+--------------------------------+--------+ CCA Prox  107     24                                                      +----------+--------+-------+--------+--------------------------------+--------+ CCA Distal96      22             smooth, heterogenous and                                                  calcific                                 +----------+--------+-------+--------+--------------------------------+--------+ ICA Prox  93      23             smooth, heterogenous and                                                  calcific                                 +----------+--------+-------+--------+--------------------------------+--------+ ICA Distal100     25                                                      +----------+--------+-------+--------+--------------------------------+--------+ ECA       119                                                             +----------+--------+-------+--------+--------------------------------+--------+ +----------+--------+--------+----------------+-------------------+           PSV cm/sEDV cm/sDescribe        Arm Pressure (mmHG) +----------+--------+--------+----------------+-------------------+ Subclavian130  Multiphasic, WNL                    +----------+--------+--------+----------------+-------------------+ +---------+--------+--+--------+--+---------+ VertebralPSV cm/s26EDV cm/s12Antegrade +---------+--------+--+--------+--+---------+   Summary: Right Carotid: Velocities in the right ICA are consistent with a 1-39% stenosis. Left Carotid: Velocities in the left ICA are consistent with a 1-39% stenosis. Vertebrals:  Bilateral vertebral arteries demonstrate antegrade flow. Subclavians: Normal flow hemodynamics were seen in bilateral subclavian              arteries.  *See table(s) above for measurements and observations.  Electronically signed by Deitra Mayo MD on 04/01/2020 at 3:28:27 PM.    Final    ECHOCARDIOGRAM LIMITED  Result Date: 04/15/2020    ECHOCARDIOGRAM LIMITED REPORT   Patient Name:   Corey Aguilar Date of Exam: 04/15/2020 Medical Rec #:  [id number]         Height:       67.0 in Accession #:    [id number]        Weight:       170.0 lb Date of Birth:  03/03/1934          BSA:          1.887 m Patient Age:    69 years          BP:           172/74 mmHg Patient Gender: M                 HR:           63 bpm. Exam Location:  Inpatient Procedure: Limited Echo, Limited Color Doppler and Cardiac Doppler Indications:    Aortic valve stenosis 424.1 / 135.0  History:        Patient has prior history of Echocardiogram examinations.                 Arrythmias:LBBB; Signs/Symptoms:Murmur and Dyspnea. Multiple                 skin cancers.                 Aortic Valve: 26 mm Edwards Sapien prosthetic, stented (TAVR)                 valve is present in the aortic position. Procedure Date:                 04/15/2020.  Sonographer:    Darlina Sicilian RDCS Referring Phys: [place]  1. Left ventricular ejection fraction, by estimation, is 50 to 55%. The left ventricle has low normal function. There is mild left ventricular hypertrophy.  2. Right ventricular systolic function is normal. The right ventricular size is normal.  3. Left atrial size was mildly dilated.  4. Pre TAVR: tri leaflet valve severe calcification and stenosis Calcificatio extends into LVOT. Mild AR Peak velocity 3.9 m/sec peak gradient 60 mmHg mean 37 mmHg AVA O.91 cm2         Post TAVR: well positioned 26 mm Sapien 3 valve. Trivial PVL peak velocity 1.8 m/sec mean gradient 6 mmHg peak 13 mmHg AVA 2.5 cm2 . The aortic valve has been repaired/replaced. There is a 26 mm Edwards Sapien prosthetic (TAVR) valve present in the aortic position. Procedure Date: 04/15/2020. FINDINGS   Left Ventricle: Left ventricular ejection fraction, by estimation, is 50 to 55%. The left ventricle has low normal function. The left ventricular internal cavity size was normal in size. There is  mild left ventricular hypertrophy. Right Ventricle: The right ventricular size is normal. No increase in right ventricular wall thickness. Right ventricular systolic function is normal. Left Atrium: Left atrial size was mildly dilated. Right Atrium: Right atrial size was normal in size. Pericardium: There is no evidence of pericardial effusion. Tricuspid Valve: Tricuspid valve regurgitation is mild. Aortic Valve: Pre TAVR: tri leaflet valve severe calcification and stenosis Calcificatio extends into LVOT. Mild AR Peak velocity 3.9 m/sec peak gradient 60 mmHg mean 37 mmHg AVA O.91 cm2 Post TAVR: well positioned 26 mm Sapien 3 valve. Trivial PVL peak velocity 1.8 m/sec mean gradient 6 mmHg peak 13 mmHg AVA 2.5 cm2. The aortic valve has been repaired/replaced. Aortic valve mean gradient measures 22.0 mmHg. Aortic valve peak gradient measures 30.0 mmHg. Aortic valve area, by VTI measures 1.08 cm. There is a 26 mm Edwards Sapien prosthetic, stented (TAVR) valve present in the aortic position. Procedure Date: 04/15/2020. Pulmonic Valve: The pulmonic valve was not assessed. IAS/Shunts: The interatrial septum was not assessed.  LEFT VENTRICLE PLAX 2D LVOT diam:     2.20 cm LV SV:         81 LV SV Index:   43 LVOT Area:     3.80 cm  AORTIC VALVE AV Area (Vmax):    1.40 cm AV Area (Vmean):   1.37 cm AV Area (VTI):     1.08 cm AV Vmax:           274.00 cm/s AV Vmean:          201.000 cm/s AV VTI:            0.752 m AV Peak Grad:      30.0 mmHg AV Mean Grad:      22.0 mmHg LVOT Vmax:         101.00 cm/s LVOT Vmean:        72.400 cm/s LVOT VTI:          0.214 m LVOT/AV VTI ratio: 0.28  SHUNTS Systemic VTI:  0.21 m Systemic Diam: 2.20 cm Jenkins Rouge MD Electronically signed by Jenkins Rouge MD Signature Date/Time: 04/15/2020/2:09:10  PM    Final    Structural Heart Procedure  Result Date: 04/15/2020 See full operative note in the Notes section under CV Procedures 26 mm Edwards Sapien 3 valve deployed from the right femoral approach.   CT Angio Abd/Pel w/ and/or w/o  Result Date: 04/01/2020 CLINICAL DATA:  85 year old male with history of severe aortic stenosis. Preprocedural study prior to potential transcatheter aortic valve replacement (TAVR) procedure. EXAM: CT ANGIOGRAPHY CHEST, ABDOMEN AND PELVIS TECHNIQUE: Non-contrast CT of the chest was initially obtained. Multidetector CT imaging through the chest, abdomen and pelvis was performed using the standard protocol during bolus administration of intravenous contrast. Multiplanar reconstructed images and MIPs were obtained and reviewed to evaluate the vascular anatomy. CONTRAST:  143mL OMNIPAQUE IOHEXOL 350 MG/ML SOLN COMPARISON:  CT the abdomen and pelvis 06/01/2018. FINDINGS: CTA CHEST FINDINGS Cardiovascular: Heart size is normal. There is no significant pericardial fluid, thickening or pericardial calcification. There is aortic atherosclerosis, as well as atherosclerosis of the great vessels of the mediastinum and the coronary arteries, including calcified atherosclerotic plaque in the left main, left anterior descending, left circumflex and right coronary arteries. Ectasia of the ascending thoracic aorta (4.1 cm in diameter). Severe thickening and calcification of the aortic valve. Mediastinum/Lymph Nodes: No pathologically enlarged mediastinal or hilar lymph nodes. Esophagus is unremarkable in appearance. No axillary lymphadenopathy. Lungs/Pleura: 4 mm right  lower lobe subpleural pulmonary nodule (axial image 63 of series 8). No other larger more suspicious appearing pulmonary nodules or masses are noted. No acute consolidative airspace disease. No pleural effusions. Musculoskeletal/Soft Tissues: There are no aggressive appearing lytic or blastic lesions noted in the visualized  portions of the skeleton. CTA ABDOMEN AND PELVIS FINDINGS Hepatobiliary: Subcentimeter low-attenuation lesion in the central aspect of segment 8 of the liver, too small to characterize, but statistically likely to represent a tiny cyst. No other suspicious hepatic lesions. No intra or extrahepatic biliary ductal dilatation. Gallbladder is normal in appearance. Pancreas: No pancreatic mass. No pancreatic ductal dilatation. No pancreatic or peripancreatic fluid collections or inflammatory changes. Spleen: Unremarkable. Adrenals/Urinary Tract: 2.2 cm low-attenuation lesion in the interpolar region of the left kidney, compatible with a simple cyst. Multiple other parapelvic lesions associated with the kidneys bilaterally (left greater than right), compatible with peripelvic cysts. Bilateral adrenal glands are normal in appearance. No hydroureteronephrosis. Urinary bladder is partially obscured by beam hardening artifact from the patient's left hip arthroplasty, but visualized portions are unremarkable in appearance. Stomach/Bowel: Normal appearance of the stomach. No pathologic dilatation of small bowel or colon. Numerous colonic diverticulae are noted, particularly in the sigmoid colon, without surrounding inflammatory changes to suggest an acute diverticulitis at this time. Normal appendix. Vascular/Lymphatic: Aortic atherosclerosis, with vascular findings and measurements pertinent to potential TAVR procedure, as detailed below. Aneurysmal dilatation of the common iliac arteries bilaterally (2.2 cm on the right and 2.3 cm on the left). No lymphadenopathy noted in the abdomen or pelvis. Reproductive: Prostate gland and seminal vesicles are largely obscured by beam hardening artifact from the patient's left hip arthroplasty. Other: No significant volume of ascites.  No pneumoperitoneum. Musculoskeletal: Status post PLIF from L1 through S1. Status post left hip arthroplasty. There are no aggressive appearing lytic or  blastic lesions noted in the visualized portions of the skeleton. VASCULAR MEASUREMENTS PERTINENT TO TAVR: AORTA: Minimal Aortic Diameter-16 x 16 mm Severity of Aortic Calcification-moderate to severe RIGHT PELVIS: Right Common Iliac Artery - Minimal Diameter-11.2 x 9.8 mm Tortuosity-moderate Calcification-moderate Right External Iliac Artery - Minimal Diameter-10.0 x 9.3 mm Tortuosity-extreme Calcification-none Right Common Femoral Artery - Minimal Diameter-10.5 x 9.9 mm Tortuosity-mild Calcification-moderate LEFT PELVIS: Left Common Iliac Artery - Minimal Diameter-13.2 x 11.4 mm Tortuosity-moderate Calcification-moderate Left External Iliac Artery - Minimal Diameter-10.9 x 10.9 mm Tortuosity-extreme Calcification-minimal Left Common Femoral Artery - Minimal Diameter-11.6 x 10.6 mm Tortuosity-mild Calcification-none Review of the MIP images confirms the above findings. IMPRESSION: 1. Vascular findings and measurements pertinent to potential TAVR procedure, as detailed above. 2. Severe thickening calcification of the aortic valve, compatible with reported clinical history of severe aortic stenosis. 3. Aortic atherosclerosis, in addition to left main and 3 vessel coronary artery disease. In addition, there is ectasia of the ascending thoracic aorta (4.1 cm in diameter). Recommend annual imaging followup by CTA or MRA. This recommendation follows 2010 ACCF/AHA/AATS/ACR/ASA/SCA/SCAI/SIR/STS/SVM Guidelines for the Diagnosis and Management of Patients with Thoracic Aortic Disease. Circulation. 2010; 121: O277-A128. Aortic aneurysm NOS (ICD10-I71.9). 4. There is also aneurysmal dilatation of the common iliac arteries bilaterally. 5. 4 mm subpleural nodule in the right lower lobe, nonspecific, but statistically likely benign. No follow-up needed if patient is low-risk. Non-contrast chest CT can be considered in 12 months if patient is high-risk. This recommendation follows the consensus statement: Guidelines for Management  of Incidental Pulmonary Nodules Detected on CT Images: From the Fleischner Society 2017; Radiology 2017; 284:228-243. 6. Severe colonic diverticulosis without evidence of  acute diverticulitis at this time. 7. Additional incidental findings, as above. Electronically Signed   By: Vinnie Langton M.D.   On: 04/01/2020 14:22   Disposition   Pt is being discharged home today in good condition.  Follow-up Plans & Appointments     Follow-up Information    Eileen Stanford, PA-C. Go on 05/01/2020.   Specialties: Cardiology, Radiology Why: @ 3:30pm, please arrive at least 10 minutes early.  Contact information: [address] [place] [id number] [phone]        Constance Haw, MD Follow up on 07/22/2020.   Specialty: Cardiology Why: at 230 pm for 91 day pacemaker check Contact information: [address] [place] [address] [phone]        [facility] Follow up on 05/01/2020.   Why: at 3:30 pm for post pacemaker wound check.  Contact information: [place] [address] [phone]               Discharge Medications   Allergies as of 04/17/2020      Reactions   Penicillins    Did it involve swelling of the face/tongue/throat, SOB, or low BP? No Did it involve sudden or severe rash/hives, skin peeling, or any reaction on the inside of your mouth or nose? Yes Did you need to seek medical attention at a hospital or doctor's office? No When did it last happen?childhood If all above answers are "NO", may proceed with cephalosporin use.   Ibuprofen Rash      Medication List    STOP taking these medications   metoprolol tartrate 50 MG tablet Commonly known as: LOPRESSOR     TAKE these medications   acetaminophen 500 MG tablet Commonly known as: TYLENOL Take 2 tablets (1,000 mg total) by mouth every 8 (eight) hours. What changed:    when to take this  reasons to take this   aspirin 81 MG chewable tablet Chew 1 tablet (81 mg total) by mouth daily. Start taking on: April 18, 2020   clopidogrel 75 MG tablet Commonly known as: PLAVIX Take 1 tablet (75 mg total) by mouth daily with breakfast. Start taking on: April 18, 2020   Ex-Lax 15 MG Chew Generic drug: Sennosides Chew 15 mg by mouth daily as needed (constipation). Ex-lax   furosemide 20 MG tablet Commonly known as: LASIX Take 1 tablet (20 mg total) by mouth daily.        Outstanding Labs/Studies   none  Duration of Discharge Encounter   Greater than 30 minutes including physician time.  Signed, Angelena Form, PA-C 04/17/2020, 9:15 AM [phone]  I have personally seen and examined this patient. I agree with the assessment and plan as outlined above.  He is doing well today post PPM. Valve is working well on echo. BP stable. Paced on tele.  No dyspnea.  Discharge home today on ASA and Plavix.   Lauree Chandler 04/17/2020 9:54 AM

## 2020-04-18 ENCOUNTER — Telehealth: Payer: Self-pay

## 2020-04-18 NOTE — Telephone Encounter (Signed)
Patient contacted regarding discharge from [facility] on 04/17/2020.  Patient understands to follow up with provider Nell Range PA-C on 05/01/2020 at 3:30 PM at [facility] location. Patient understands discharge instructions? yes Patient understands medications and regiment? yes Patient understands to bring all medications to this visit? Yes  The pt is feeling well today with no complaints.

## 2020-04-23 ENCOUNTER — Other Ambulatory Visit: Payer: PPO

## 2020-04-23 ENCOUNTER — Ambulatory Visit: Payer: PPO | Admitting: Physician Assistant

## 2020-04-24 ENCOUNTER — Ambulatory Visit: Payer: PPO | Admitting: Physician Assistant

## 2020-04-28 DIAGNOSIS — Z299 Encounter for prophylactic measures, unspecified: Secondary | ICD-10-CM | POA: Diagnosis not present

## 2020-04-28 DIAGNOSIS — I350 Nonrheumatic aortic (valve) stenosis: Secondary | ICD-10-CM | POA: Diagnosis not present

## 2020-04-28 DIAGNOSIS — I10 Essential (primary) hypertension: Secondary | ICD-10-CM | POA: Diagnosis not present

## 2020-04-28 DIAGNOSIS — I5189 Other ill-defined heart diseases: Secondary | ICD-10-CM | POA: Diagnosis not present

## 2020-04-29 ENCOUNTER — Ambulatory Visit: Payer: PPO

## 2020-04-30 ENCOUNTER — Ambulatory Visit: Payer: PPO | Admitting: Cardiovascular Disease

## 2020-05-01 ENCOUNTER — Encounter: Payer: Self-pay | Admitting: Physician Assistant

## 2020-05-01 ENCOUNTER — Other Ambulatory Visit: Payer: Self-pay

## 2020-05-01 ENCOUNTER — Ambulatory Visit (INDEPENDENT_AMBULATORY_CARE_PROVIDER_SITE_OTHER): Payer: PPO | Admitting: Emergency Medicine

## 2020-05-01 ENCOUNTER — Ambulatory Visit (INDEPENDENT_AMBULATORY_CARE_PROVIDER_SITE_OTHER): Payer: PPO | Admitting: Physician Assistant

## 2020-05-01 VITALS — BP 138/76 | HR 65 | Ht 67.0 in | Wt 175.4 lb

## 2020-05-01 DIAGNOSIS — I5032 Chronic diastolic (congestive) heart failure: Secondary | ICD-10-CM

## 2020-05-01 DIAGNOSIS — Z950 Presence of cardiac pacemaker: Secondary | ICD-10-CM

## 2020-05-01 DIAGNOSIS — I442 Atrioventricular block, complete: Secondary | ICD-10-CM

## 2020-05-01 DIAGNOSIS — Z952 Presence of prosthetic heart valve: Secondary | ICD-10-CM

## 2020-05-01 NOTE — Patient Instructions (Signed)
Medication Instructions:  Your provider recommends that you continue on your current medications as directed. Please refer to the Current Medication list given to you today.   *If you need a refill on your cardiac medications before your next appointment, please call your pharmacy*  Follow-Up: Please keep your appointments as scheduled!

## 2020-05-01 NOTE — Progress Notes (Signed)
Corey Aguilar                                       Cardiology Office Note    Date:  05/02/2020   ID:  Corey Aguilar, DOB 1934-09-12, MRN [id number]  PCP:  Monico Blitz, MD  Cardiologist:  Dr. Bronson Ing / Dr. Angelena Form & Dr. Roxy Manns (TAVR)  CC: [facility] s/p TAVR, wound check for pacemaker.   History of Present Illness:  Corey Aguilar is a 86 y.o. male with a history of multiple skin cancers, nephrolithiasis,LBBB, 1st degree AV blockand severe aortic stenosis s/p TAVR (04/15/20) c/b CHB s/p PPM (04/16/20) who presents to clinic for follow up.  He had an echocardiogram in outside facility in 10/2019 showing severe aortic stenosis and mild aortic insufficiency with normal left ventricular ejection fraction. He was referred to Dr. Angelena Form but denied any symptoms and plans were made for follow-up echocardiogram in 3 months. His wife called the office on 03/18/2020 reporting that he had been having more symptoms with dizziness and feeling like he might pass out. She felt like he may be having some chest discomfort and shortness of breath when lying down. He had a follow-up echocardiogram on 03/19/2020 which showed severe aortic stenosis with a mean gradient of 44.5 mmHg and a peak gradient of 71.1 mmHg. Aortic valve area was 0.73 cm. Aortic insufficiency was mild with a pressure half-time of 389 ms. Left ventricular ejection fraction was 50 to 55%. He subsequently underwent cardiac catheterization on 03/27/2020 which showed moderate nonobstructive disease in the mid LAD, second Diagonal branch and first obtuse marginal branch. Moderately severe stenosis in the small to moderate caliber, non-dominant RCA. Medical therapy was recommended.   He was evaluated by the multidisciplinary valve team and underwent successful TAVR with a 26 mm Edwards Sapien 3 THV Ultra via the TF approach on 04/16/20. Post operative echo showed EF 50%, normally functioning TAVR  with a mean gradient of  mmHg and 15 mm hg and no PVL. Post op course complicated by CHB and he underwent successful PPM with a [place] MRI on 04/16/20 with Dr. Curt Bears. He was discharged on aspirin and plavix.   Today he presents to clinic for follow up. Doing well. Here with wife. No CP or SOB. No LE edema, orthopnea or PND. No dizziness or syncope. No blood in stool or urine. No palpitations. Has some mild tenderness down left thigh where there is ecchymosis.     Past Medical History:  Diagnosis Date  . Arthritis   . BCC (basal cell carcinoma of skin) 12/11/2014   v of neck (CX35FU)  . BCC (basal cell carcinoma of skin) 04/07/1989   left cheek  . History of kidney stones   . Left hip pain   . Lumbago   . S/P placement of cardiac pacemaker 04/16/2020   placed after CHB after TAVR   . S/P TAVR (transcatheter aortic valve replacement) 04/15/2020   s/p TAVR with a 26 mm Edwards S3U via the TF approach by Dr. Angelena Form and Roxy Manns  . SCC (squamous cell carcinoma) 07/20/2011   left forearm (CX35FU)  . SCC (squamous cell carcinoma) 10/05/2011   Left ear (CX35FU)  . SCC (squamous cell carcinoma) 08/22/2018   left jawline (CX35FU)  . Severe aortic stenosis   . Superficial basal cell carcinoma (BCC) 07/20/2011  tip of nose (CX35FU)    Past Surgical History:  Procedure Laterality Date  . BACK SURGERY  2015  . CARDIAC CATHETERIZATION  03/27/2020  . EYE SURGERY     cataract sx in both eyes  . JOINT REPLACEMENT Left    hip  . PACEMAKER IMPLANT N/A 04/16/2020   Procedure: PACEMAKER IMPLANT;  Surgeon: Constance Haw, MD;  Location: [facility];  Service: Cardiovascular;  Laterality: N/A;  . RIGHT/LEFT HEART CATH AND CORONARY ANGIOGRAPHY N/A 03/27/2020   Procedure: RIGHT/LEFT HEART CATH AND CORONARY ANGIOGRAPHY;  Surgeon: Burnell Blanks, MD;  Location: [facility];  Service: Cardiovascular;  Laterality: N/A;  . TEE WITHOUT CARDIOVERSION N/A 04/15/2020     Procedure: TRANSESOPHAGEAL ECHOCARDIOGRAM (TEE);  Surgeon: Burnell Blanks, MD;  Location: [facility];  Service: Open Heart Surgery;  Laterality: N/A;  . TOTAL HIP ARTHROPLASTY Left 05/29/2019   Procedure: TOTAL HIP ARTHROPLASTY ANTERIOR APPROACH;  Surgeon: Paralee Cancel, MD;  Location: [facility];  Service: Orthopedics;  Laterality: Left;  70 mins  . TRANSCATHETER AORTIC VALVE REPLACEMENT, TRANSFEMORAL N/A 04/15/2020   Procedure: TRANSCATHETER AORTIC VALVE REPLACEMENT, TRANSFEMORAL;  Surgeon: Burnell Blanks, MD;  Location: [facility];  Service: Open Heart Surgery;  Laterality: N/A;    Current Medications: Outpatient Medications Prior to Visit  Medication Sig Dispense Refill  . acetaminophen (TYLENOL) 500 MG tablet Take 2 tablets (1,000 mg total) by mouth every 8 (eight) hours. (Patient taking differently: Take 1,000 mg by mouth every 6 (six) hours as needed for mild pain or moderate pain. ) 30 tablet 0  . aspirin 81 MG chewable tablet Chew 1 tablet (81 mg total) by mouth daily.    . clopidogrel (PLAVIX) 75 MG tablet Take 1 tablet (75 mg total) by mouth daily with breakfast. 90 tablet 1  . furosemide (LASIX) 20 MG tablet Take 1 tablet (20 mg total) by mouth daily. 90 tablet 3  . Sennosides (EX-LAX) 15 MG CHEW Chew 15 mg by mouth daily as needed (constipation). Ex-lax     No facility-administered medications prior to visit.     Allergies:   Penicillins and Ibuprofen   Social History   Socioeconomic History  . Marital status: Married    Spouse name: Not on file  . Number of children: 3  . Years of education: Not on file  . Highest education level: Not on file  Occupational History  . Occupation: Retired Armed forces logistics/support/administrative officer.  Tobacco Use  . Smoking status: Former Smoker    Quit date: 1957    Years since quitting: 64.5  . Smokeless tobacco: Never Used  Vaping Use  . Vaping Use: Never used  Substance and Sexual Activity  . Alcohol use: Yes    Comment:  occasional wine with a meal  . Drug use: Never  . Sexual activity: Not on file  Other Topics Concern  . Not on file  Social History Narrative   3 grown children.       Patient has limited literacy.    Social Determinants of Health   Financial Resource Strain:   . Difficulty of Paying Living Expenses:   Food Insecurity:   . Worried About Charity fundraiser in the Last Year:   . Arboriculturist in the Last Year:   Transportation Needs:   . Film/video editor (Medical):   [name] Lack of Transportation (Non-Medical):   Physical Activity:   . Days of Exercise per Week:   .  Minutes of Exercise per Session:   Stress:   . Feeling of Stress :   Social Connections:   . Frequency of Communication with Friends and Family:   . Frequency of Social Gatherings with Friends and Family:   . Attends Religious Services:   . Active Member of Clubs or Organizations:   . Attends Archivist Meetings:   [name] Marital Status:      Family History:  The patient's family history includes Cancer in his brother and sister; Diabetes in his mother; Heart attack in his father; Heart disease in his mother.     ROS:   Please see the history of present illness.    ROS All other systems reviewed and are negative.   PHYSICAL EXAM:   VS:  BP 138/76   Pulse 65   Ht 5\' 7"  (1.702 m)   Wt 175 lb 6.4 oz (79.6 kg)   SpO2 95%   BMI 27.47 kg/m    GEN: Well nourished, well developed, in no acute distress HEENT: normal Neck: no JVD or masses Cardiac: RRR; no murmurs, rubs, or gallops,no edema  Respiratory:  clear to auscultation bilaterally, normal work of breathing GI: soft, nontender, nondistended, + BS MS: no deformity or atrophy Skin: warm and dry, no rash.  Groin sites clear without hematoma. Some ecchymosis down left thigh Neuro:  Alert and Oriented x 3, Strength and sensation are intact Psych: euthymic mood, full affect   Wt Readings from Last 3 Encounters:  05/01/20 175 lb 6.4 oz (79.6 kg)   04/17/20 174 lb 9.7 oz (79.2 kg)  04/11/20 174 lb 14.4 oz (79.3 kg)      Studies/Labs Reviewed:   EKG:  EKG is NOT ordered today.    Recent Labs: 04/11/2020: ALT 11; B Natriuretic Peptide 208.2 04/16/2020: Magnesium 1.7 04/17/2020: BUN 15; Creatinine, Ser 0.83; Hemoglobin 12.6; Platelets 127; Potassium 3.9; Sodium 137   Lipid Panel No results found for: CHOL, TRIG, HDL, CHOLHDL, VLDL, LDLCALC, LDLDIRECT  Additional studies/ records that were reviewed today include:  TAVR OPERATIVE NOTE   Date of Procedure:04/15/2020  Preoperative Diagnosis:Severe Aortic Stenosis   Postoperative Diagnosis:Same   Procedure:   Transcatheter Aortic Valve Replacement - PercutaneousRightTransfemoral Approach Edwards Sapien 3 Ultra THV (size 17mm, model # 9750TFX, serial # [id number])  Co-Surgeons:Christopher Angelena Form, MD andClarence H. Roxy Manns, MD  Anesthesiologist:Tallan Linna Caprice, MD  Echocardiographer:Peter Johnsie Cancel, MD  Pre-operative Echo Findings: ? Severe aortic stenosis ? Normalleft ventricular systolic function  Post-operative Echo Findings: ? Mildparavalvular leak ? Normalleft ventricular systolic function  _____________   Echo 04/16/20: IMPRESSIONS 1. Abnormal septal motion . Left ventricular ejection fraction, by  estimation, is 50 to 55%. The left ventricle has low normal function. The  left ventricle has no regional wall motion abnormalities. Left ventricular  diastolic parameters are consistent  with Grade I diastolic dysfunction (impaired relaxation).  2. Right ventricular systolic function is normal. The right ventricular  size is normal. There is normal pulmonary artery systolic pressure.  3. The mitral valve is normal in structure. No evidence of mitral valve  regurgitation. No evidence of mitral stenosis.  4. Post TAVR with 26 mm Sapien 3 valve No  PVL Peak velocity 2.4 m/sec  peak gradient 24 mmHg peak 15 mmHg AVA 2.7 cm2 DVI 0.51 . The aortic valve  has been repaired/replaced. Aortic valve regurgitation is not visualized.  No aortic stenosis is present.  There is a 26 mm Edwards Ultra, stented (TAVR) valve present in the aortic  position. Procedure  Date: 04/15/2020.  5. The inferior vena cava is normal in size with greater than 50%  respiratory variability, suggesting right atrial pressure of 3 mmHg.   ______________   04/16/20 PACEMAKER IMPLANT  Conclusion SURGEON:  Will Camnitz, MD     PREPROCEDURE DIAGNOSIS:  Complete AV block    POSTPROCEDURE DIAGNOSIS:  Complete AV block     PROCEDURES:   1. Pacemaker implantation.  2. Left upper extremity venography.     ASSESSMENT & PLAN:   Severe AS s/p TAVR: doing great. Some mild tenderness down left thigh where he has some ecchymosis. Otherwise, doing quite well and groin sites healing well. Has pacemaker wound check today. SBE prophylaxis discussed; the patient is edentulous and does not go to the dentist. Continue aspirin and plavix. I will see him back in a couple weeks for 1 month follow up and echo.  CHB s/p PPM: wound check today   Chronic diastolic CHF: continue on lasix. Pt wondered if he could come off this. Given borderline BP, I have asked him to stay on it.     Medication Adjustments/Labs and Tests Ordered: Current medicines are reviewed at length with the patient today.  Concerns regarding medicines are outlined above.  Medication changes, Labs and Tests ordered today are listed in the Patient Instructions below. Patient Instructions  Medication Instructions:  Your provider recommends that you continue on your current medications as directed. Please refer to the Current Medication list given to you today.   *If you need a refill on your cardiac medications before your next appointment, please call your pharmacy*  Follow-Up: Please keep your appointments as  scheduled!    Signed, Angelena Form, PA-C  05/02/2020 2:17 PM    [facility] [place], [place], [place]  [address] Phone: [phone]; Fax: [phone]

## 2020-05-09 ENCOUNTER — Telehealth: Payer: Self-pay | Admitting: Cardiovascular Disease

## 2020-05-09 NOTE — Telephone Encounter (Signed)
Pt c/o medication issue:  1. Name of Medication: clopidogrel (PLAVIX) 75 MG tablet and aspirin 81 MG chewable tablet  2. How are you currently taking this medication (dosage and times per day)?  3. Are you having a reaction (difficulty breathing--STAT)?   4. What is your medication issue? Wife states that whenever he bumps his arms and he gets a cut it's difficult to get the bleeding to stop.  She wants to know if he has to continue taking both the plavix and aspirin.

## 2020-05-09 NOTE — Telephone Encounter (Signed)
Informed wife that pt will most likely need to remain on both. Aware will forward to Dr. Angelena Form for advisement. Aware office will call w/ recommendation once he advises. Wife verbalized understanding and agreeable to plan.

## 2020-05-13 NOTE — Telephone Encounter (Signed)
He can stop ASA and continue Plavix. Corey Aguilar

## 2020-05-13 NOTE — Telephone Encounter (Signed)
Called and made patient aware that he may stop ASA and continue plavix. Patient verbalized understanding and thanked me for the call.

## 2020-05-14 LAB — CUP PACEART INCLINIC DEVICE CHECK
Battery Remaining Longevity: 130 mo
Battery Voltage: 3.07 V
Brady Statistic RA Percent Paced: 2.3 %
Brady Statistic RV Percent Paced: 99.47 %
Date Time Interrogation Session: 20210624164100
Implantable Lead Implant Date: 20210609
Implantable Lead Implant Date: 20210609
Implantable Lead Location: 753859
Implantable Lead Location: 753860
Implantable Pulse Generator Implant Date: 20210609
Lead Channel Impedance Value: 475 Ohm
Lead Channel Impedance Value: 587.5 Ohm
Lead Channel Pacing Threshold Amplitude: 0.5 V
Lead Channel Pacing Threshold Amplitude: 0.625 V
Lead Channel Pacing Threshold Pulse Width: 0.4 ms
Lead Channel Pacing Threshold Pulse Width: 0.5 ms
Lead Channel Sensing Intrinsic Amplitude: 5 mV
Lead Channel Setting Pacing Amplitude: 0.875
Lead Channel Setting Pacing Amplitude: 3.5 V
Lead Channel Setting Pacing Pulse Width: 0.5 ms
Lead Channel Setting Sensing Sensitivity: 2 mV
Pulse Gen Model: 2272
Pulse Gen Serial Number: 3829143

## 2020-05-14 NOTE — Progress Notes (Signed)
Wound check appointment. Steri-strips removed. Wound without redness or edema. Incision edges approximated, wound well healed. Normal device function. Thresholds, sensing, and impedances consistent with implant measurements. Device programmed at 3.5V/auto capture programmed on for extra safety margin until 3 month visit. Histogram distribution appropriate for patient and level of activity. No mode switches or high ventricular rates noted. Patient educated about wound care, arm mobility, lifting restrictions. ROV with Dr Curt Bears 07/22/20. Enrolled in remote follow ups and next remote scheduled for 07/17/20.

## 2020-05-20 NOTE — Progress Notes (Addendum)
[facility]                                       Cardiology Office Note    Date:  05/23/2020   ID:  Corey Aguilar, DOB [birth year], MRN [id number]  PCP:  Monico Blitz, MD  Cardiologist:  Dr. Bronson Ing / Dr. Angelena Form & Dr. Roxy Manns (TAVR)  CC: 1 month s/p TAVR  History of Present Illness:  Corey Aguilar is a 86 y.o. male with a history of multiple skin cancers, nephrolithiasis,LBBB, 1st degree AV blockand severe aortic stenosis s/p TAVR (04/15/20) c/b CHB s/p PPM (04/16/20) who presents to clinic for follow up.  He had an echocardiogram in outside facility in 10/2019 showing severe aortic stenosis and mild aortic insufficiency with normal left ventricular ejection fraction. He was referred to Dr. Angelena Form but denied any symptoms and plans were made for follow-up echocardiogram in 3 months. His wife called the office on 03/18/2020 reporting that he had been having more symptoms with dizziness and feeling like he might pass out. She felt like he may be having some chest discomfort and shortness of breath when lying down. He had a follow-up echocardiogram on 03/19/2020 which showed severe aortic stenosis with a mean gradient of 44.5 mmHg and a peak gradient of 71.1 mmHg. Aortic valve area was 0.73 cm. Aortic insufficiency was mild with a pressure half-time of 389 ms. Left ventricular ejection fraction was 50 to 55%. He subsequently underwent cardiac catheterization on 03/27/2020 which showed moderate nonobstructive disease in the mid LAD, second Diagonal branch and first obtuse marginal branch. Moderately severe stenosis in the small to moderate caliber, non-dominant RCA. Medical therapy was recommended.   He was evaluated by the multidisciplinary valve team and underwent successful TAVR with a 26 mm Edwards Sapien 3 THV Ultra via the TF approach on 04/16/20. Post operative echo showed EF 50%, normally functioning TAVR with a mean gradient of   mmHg and 15 mm hg and no PVL. Post op course complicated by CHB and he underwent successful PPM with a Delta MRI on 04/16/20 with Dr. Curt Bears. He was discharged on aspirin and plavix.   He later called into the office complaining of excessive bruising and aspirin was dropped.  Today he presents to clinic for follow up. He is doing okay. No big complaints. Says his shortness of breath may be a little worse since TAVR. He can do most of what he wants but disappointed he cannot use a chainsaw with his pacemaker. No CP.[name] No LE edema, orthopnea or PND. No dizziness or syncope. No blood in stool or urine. No palpitations.     Past Medical History:  Diagnosis Date  . Arthritis   . BCC (basal cell carcinoma of skin) 12/11/2014   v of neck (CX35FU)  . BCC (basal cell carcinoma of skin) 04/07/1989   left cheek  . History of kidney stones   . Left hip pain   . Lumbago   . S/P placement of cardiac pacemaker 04/16/2020   placed after CHB after TAVR   . S/P TAVR (transcatheter aortic valve replacement) 04/15/2020   s/p TAVR with a 26 mm Edwards S3U via the TF approach by Dr. Angelena Form and Roxy Manns  . SCC (squamous cell carcinoma) 07/20/2011   left forearm (CX35FU)  . SCC (squamous cell carcinoma) 10/05/2011  Left ear (CX35FU)  . SCC (squamous cell carcinoma) 08/22/2018   left jawline (CX35FU)  . Severe aortic stenosis   . Superficial basal cell carcinoma (BCC) 07/20/2011   tip of nose (CX35FU)    Past Surgical History:  Procedure Laterality Date  . BACK SURGERY  2015  . CARDIAC CATHETERIZATION  03/27/2020  . EYE SURGERY     cataract sx in both eyes  . JOINT REPLACEMENT Left    hip  . PACEMAKER IMPLANT N/A 04/16/2020   Procedure: PACEMAKER IMPLANT;  Surgeon: Constance Haw, MD;  Location: [facility];  Service: Cardiovascular;  Laterality: N/A;  . RIGHT/LEFT HEART CATH AND CORONARY ANGIOGRAPHY N/A 03/27/2020   Procedure: RIGHT/LEFT HEART CATH AND CORONARY  ANGIOGRAPHY;  Surgeon: Burnell Blanks, MD;  Location: [facility];  Service: Cardiovascular;  Laterality: N/A;  . TEE WITHOUT CARDIOVERSION N/A 04/15/2020   Procedure: TRANSESOPHAGEAL ECHOCARDIOGRAM (TEE);  Surgeon: Burnell Blanks, MD;  Location: [facility];  Service: Open Heart Surgery;  Laterality: N/A;  . TOTAL HIP ARTHROPLASTY Left 05/29/2019   Procedure: TOTAL HIP ARTHROPLASTY ANTERIOR APPROACH;  Surgeon: Paralee Cancel, MD;  Location: [facility];  Service: Orthopedics;  Laterality: Left;  70 mins  . TRANSCATHETER AORTIC VALVE REPLACEMENT, TRANSFEMORAL N/A 04/15/2020   Procedure: TRANSCATHETER AORTIC VALVE REPLACEMENT, TRANSFEMORAL;  Surgeon: Burnell Blanks, MD;  Location: [facility];  Service: Open Heart Surgery;  Laterality: N/A;    Current Medications: Outpatient Medications Prior to Visit  Medication Sig Dispense Refill  . clopidogrel (PLAVIX) 75 MG tablet Take 1 tablet (75 mg total) by mouth daily with breakfast. 90 tablet 1  . furosemide (LASIX) 20 MG tablet Take 1 tablet (20 mg total) by mouth daily. 90 tablet 3  . Sennosides (EX-LAX) 15 MG CHEW Chew 15 mg by mouth daily as needed (constipation). Ex-lax    . acetaminophen (TYLENOL) 500 MG tablet Take 2 tablets (1,000 mg total) by mouth every 8 (eight) hours. (Patient not taking: Reported on 05/22/2020) 30 tablet 0   No facility-administered medications prior to visit.     Allergies:   Penicillins and Ibuprofen   Social History   Socioeconomic History  . Marital status: Married    Spouse name: Not on file  . Number of children: 3  . Years of education: Not on file  . Highest education level: Not on file  Occupational History  . Occupation: Retired Armed forces logistics/support/administrative officer.  Tobacco Use  . Smoking status: Former Smoker    Quit date: 1957    Years since quitting: 64.5  . Smokeless tobacco: Never Used  Vaping Use  . Vaping Use: Never used  Substance and Sexual Activity  . Alcohol use: Yes     Comment: occasional wine with a meal  . Drug use: Never  . Sexual activity: Not on file  Other Topics Concern  . Not on file  Social History Narrative   3 grown children.       Patient has limited literacy.    Social Determinants of Health   Financial Resource Strain:   . Difficulty of Paying Living Expenses:   Food Insecurity:   . Worried About Charity fundraiser in the Last Year:   . Arboriculturist in the Last Year:   Transportation Needs:   . Film/video editor (Medical):   [name] Lack of Transportation (Non-Medical):   Physical Activity:   . Days of Exercise per Week:   . Minutes  of Exercise per Session:   Stress:   . Feeling of Stress :   Social Connections:   . Frequency of Communication with Friends and Family:   . Frequency of Social Gatherings with Friends and Family:   . Attends Religious Services:   . Active Member of Clubs or Organizations:   . Attends Archivist Meetings:   [name] Marital Status:      Family History:  The patient's family history includes Cancer in his brother and sister; Diabetes in his mother; Heart attack in his father; Heart disease in his mother.     ROS:   Please see the history of present illness.    ROS All other systems reviewed and are negative.   PHYSICAL EXAM:   VS:  BP (!) 146/90   Pulse 65   Ht 5\' 7"  (1.702 m)   Wt 178 lb 9.6 oz (81 kg)   SpO2 97%   BMI 27.97 kg/m    GEN: Well nourished, well developed, in no acute distress HEENT: normal Neck: no JVD or masses Cardiac: RRR; soft flow murmur,. No rubs, or gallops,no edema  Respiratory:  clear to auscultation bilaterally, normal work of breathing GI: soft, nontender, nondistended, + BS MS: no deformity or atrophy Skin: warm and dry, no rash.  Neuro:  Alert and Oriented x 3, Strength and sensation are intact Psych: euthymic mood, full affect   Wt Readings from Last 3 Encounters:  05/22/20 178 lb 9.6 oz (81 kg)  05/01/20 175 lb 6.4 oz (79.6 kg)  04/17/20 174  lb 9.7 oz (79.2 kg)      Studies/Labs Reviewed:   EKG:  EKG is NOT ordered today.    Recent Labs: 04/11/2020: ALT 11; B Natriuretic Peptide 208.2 04/16/2020: Magnesium 1.7 04/17/2020: BUN 15; Creatinine, Ser 0.83; Hemoglobin 12.6; Platelets 127; Potassium 3.9; Sodium 137   Lipid Panel No results found for: CHOL, TRIG, HDL, CHOLHDL, VLDL, LDLCALC, LDLDIRECT  Additional studies/ records that were reviewed today include:  TAVR OPERATIVE NOTE   Date of Procedure:04/15/2020  Preoperative Diagnosis:Severe Aortic Stenosis   Postoperative Diagnosis:Same   Procedure:   Transcatheter Aortic Valve Replacement - PercutaneousRightTransfemoral Approach Edwards Sapien 3 Ultra THV (size 62mm, model # 9750TFX, serial # [id number])  Co-Surgeons:Christopher Angelena Form, MD andClarence H. Roxy Manns, MD  Anesthesiologist:Johncarlos Linna Caprice, MD  Echocardiographer:Peter Johnsie Cancel, MD  Pre-operative Echo Findings: ? Severe aortic stenosis ? Normalleft ventricular systolic function  Post-operative Echo Findings: ? Mildparavalvular leak ? Normalleft ventricular systolic function  _____________   Echo 04/16/20: IMPRESSIONS 1. Abnormal septal motion . Left ventricular ejection fraction, by  estimation, is 50 to 55%. The left ventricle has low normal function. The  left ventricle has no regional wall motion abnormalities. Left ventricular  diastolic parameters are consistent  with Grade I diastolic dysfunction (impaired relaxation).  2. Right ventricular systolic function is normal. The right ventricular  size is normal. There is normal pulmonary artery systolic pressure.  3. The mitral valve is normal in structure. No evidence of mitral valve  regurgitation. No evidence of mitral stenosis.  4. Post TAVR with 26 mm Sapien 3 valve No PVL Peak velocity 2.4 m/sec  peak gradient 24 mmHg  peak 15 mmHg AVA 2.7 cm2 DVI 0.51 . The aortic valve  has been repaired/replaced. Aortic valve regurgitation is not visualized.  No aortic stenosis is present.  There is a 26 mm Edwards Ultra, stented (TAVR) valve present in the aortic  position. Procedure Date: 04/15/2020.  5. The inferior vena cava  is normal in size with greater than 50%  respiratory variability, suggesting right atrial pressure of 3 mmHg.   ______________   04/16/20 PACEMAKER IMPLANT  Conclusion SURGEON:  Will Camnitz, MD     PREPROCEDURE DIAGNOSIS:  Complete AV block    POSTPROCEDURE DIAGNOSIS:  Complete AV block     PROCEDURES:   1. Pacemaker implantation.  2. Left upper extremity venography.   _________________  Echo 05/22/20 IMPRESSIONS    1. Left ventricular ejection fraction, by estimation, is 50 to 55%. The  left ventricle has low normal function. The left ventricle demonstrates  regional wall motion abnormalities (see scoring diagram/findings for  description). Left ventricular diastolic  parameters are consistent with Grade I diastolic dysfunction (impaired  relaxation). There is moderate hypokinesis of the left ventricular,  basal-mid inferior wall.  2. Right ventricular systolic function is low normal. The right  ventricular size is normal. There is normal pulmonary artery systolic  pressure.  3. The aortic valve has been repaired/replaced. There is a 26 mm Ultra,  stented (TAVR) valve present in the aortic position. Procedure Date:  04/15/20. Echo findings are consistent with trivial perivalvular leak of the  aortic prosthesis. Possible  restrictive VSD of the perimembranous septum, below the valve prosthesis.  May be TAVR related.  4. Left atrial size was moderately dilated.  5. Tricuspid valve regurgitation is moderate.  6. The mitral valve is abnormal. Trivial mitral valve regurgitation.   Comparison(s): 04/16/20 EF 50-55%. PA pressure 31mmHg, severe AS.    Conclusion(s)/Recommendation(s): Small perimembranous VSD is suspected, ?  if this is procedure related - consider further imaging.  _________________  [facility] Cardiomyopathy Questionnaire  KCCQ-12 05/22/2020 03/19/2020  1 a. Ability to shower/bathe Not at all limited Not at all limited  1 b. Ability to walk 1 block Slightly limited Slightly limited  1 c. Ability to hurry/jog Moderately limited Other, Did not do  2. Edema feet/ankles/legs Never over the past 2 weeks Every morning  3. Limited by fatigue At least once a day Never over the past 2 weeks  4. Limited by dyspnea Several times a day Never over the past 2 weeks  5. Sitting up / on 3+ pillows Never over the past 2 weeks Every night  6. Limited enjoyment of life Moderately limited Slightly limited  7. Rest of life w/ symptoms Mostly satisfied Mostly satisfied  8 a. Participation in hobbies Slightly limited Did not limit at all  8 b. Participation in chores Moderately limited Did not limit at all  8 c. Visiting family/friends Slightly limited Did not limit at all      ASSESSMENT & PLAN:   Severe AS s/p TAVR: Echo today shows EF 50-55%, normally functioning TAVR with a mean gradient of 16.5 mmHg and trivial PVL. There is a small perimembranous VSD. Question if this is procedure related. Reviewed with Dr. Burt Knack who was in the office today. Given small shunt, will plan to repeat echo in 6 months. He is clinically stable with NYHA class II symptoms. SBE prophylaxis discussed; the patient is edentulous and does not go to the dentist. He is currently on monotherapy with plavix due to excessive bruising. When completes 6 months of plavix, he can switch to aspirin alone.   CHB s/p PPM: followed by Dr. Curt Bears. He is disappointed he cannot use a chain saw with his pacemaker. I have asked him to clarify this with Dr. Curt Bears .  Chronic diastolic CHF: appears euvolemic. No changes made.   Elevated BP:  elevated initially, but BP 128/78  on my personal recheck  TAA: pre TAVR CT showed ectasia of the ascending thoracic aorta (4.1 cm in diameter). Recommend annual imaging followup by CTA or MRA. Will follow over time.   Pulmonary nodule: pre TAVR CT showed a 4 mm subpleural nodule in the right lower lobe, nonspecific, but statistically likely benign. No follow-up needed if patient is low-risk. Non-contrast chest CT can be considered in 12 months if patient is high-risk. Former smoker. Will get CT scan set up at 1 year  Medication Adjustments/Labs and Tests Ordered: Current medicines are reviewed at length with the patient today.  Concerns regarding medicines are outlined above.  Medication changes, Labs and Tests ordered today are listed in the Patient Instructions below. Patient Instructions  Medication Instructions:  Your physician has recommended you make the following change in your medication:  1.) When you finish the Plavix prescription in December - stop and begin  aspirin 81 mg daily  *If you need a refill on your cardiac medications before your next appointment, please call your pharmacy*   Lab Work: none If you have labs (blood work) drawn today and your tests are completely normal, you will receive your results only by: [name] MyChart Message (if you have MyChart) OR . A paper copy in the mail If you have any lab test that is abnormal or we need to change your treatment, we will call you to review the results.   Testing/Procedures: none   Follow-Up: As planned with your primary cardiology office.  Dr. Domenic Polite or Katina Dung, APP will see you in the [facility] office.  Other Instructions      Signed, Angelena Form, PA-C  05/23/2020 11:57 AM    [facility] [address], [place], [place]  [address] Phone: [phone]; Fax: [phone]

## 2020-05-22 ENCOUNTER — Ambulatory Visit: Payer: PPO | Admitting: Physician Assistant

## 2020-05-22 ENCOUNTER — Encounter: Payer: Self-pay | Admitting: Physician Assistant

## 2020-05-22 ENCOUNTER — Ambulatory Visit (HOSPITAL_COMMUNITY): Payer: PPO | Attending: Internal Medicine

## 2020-05-22 ENCOUNTER — Other Ambulatory Visit: Payer: Self-pay

## 2020-05-22 ENCOUNTER — Other Ambulatory Visit: Payer: Self-pay | Admitting: Physician Assistant

## 2020-05-22 VITALS — BP 146/90 | HR 65 | Ht 67.0 in | Wt 178.6 lb

## 2020-05-22 DIAGNOSIS — Z952 Presence of prosthetic heart valve: Secondary | ICD-10-CM | POA: Diagnosis not present

## 2020-05-22 DIAGNOSIS — I5032 Chronic diastolic (congestive) heart failure: Secondary | ICD-10-CM

## 2020-05-22 DIAGNOSIS — Z950 Presence of cardiac pacemaker: Secondary | ICD-10-CM

## 2020-05-22 DIAGNOSIS — I712 Thoracic aortic aneurysm, without rupture, unspecified: Secondary | ICD-10-CM

## 2020-05-22 DIAGNOSIS — R911 Solitary pulmonary nodule: Secondary | ICD-10-CM

## 2020-05-22 LAB — ECHOCARDIOGRAM COMPLETE
AR max vel: 1.95 cm2
AV Area VTI: 1.93 cm2
AV Area mean vel: 1.89 cm2
AV Mean grad: 16.5 mmHg
AV Peak grad: 26.9 mmHg
Ao pk vel: 2.6 m/s
Area-P 1/2: 3.65 cm2
Height: 67 in
S' Lateral: 3.7 cm
Weight: 2857.6 oz

## 2020-05-22 NOTE — Patient Instructions (Signed)
Medication Instructions:  Your physician has recommended you make the following change in your medication:  1.) When you finish the Plavix prescription in December - stop and begin  aspirin 81 mg daily  *If you need a refill on your cardiac medications before your next appointment, please call your pharmacy*   Lab Work: none If you have labs (blood work) drawn today and your tests are completely normal, you will receive your results only by: [name] MyChart Message (if you have MyChart) OR . A paper copy in the mail If you have any lab test that is abnormal or we need to change your treatment, we will call you to review the results.   Testing/Procedures: none   Follow-Up: As planned with your primary cardiology office.  Dr. Domenic Polite or Katina Dung, APP will see you in the [facility] office.  Other Instructions

## 2020-05-23 ENCOUNTER — Other Ambulatory Visit: Payer: Self-pay | Admitting: Physician Assistant

## 2020-05-23 DIAGNOSIS — Q210 Ventricular septal defect: Secondary | ICD-10-CM

## 2020-06-23 ENCOUNTER — Ambulatory Visit (HOSPITAL_COMMUNITY): Admission: RE | Admit: 2020-06-23 | Payer: PPO | Source: Ambulatory Visit

## 2020-07-17 ENCOUNTER — Ambulatory Visit (INDEPENDENT_AMBULATORY_CARE_PROVIDER_SITE_OTHER): Payer: PPO | Admitting: *Deleted

## 2020-07-17 DIAGNOSIS — I442 Atrioventricular block, complete: Secondary | ICD-10-CM | POA: Diagnosis not present

## 2020-07-17 LAB — CUP PACEART REMOTE DEVICE CHECK
Battery Remaining Longevity: 109 mo
Battery Remaining Percentage: 95.5 %
Battery Voltage: 3.02 V
Brady Statistic AP VP Percent: 7.9 %
Brady Statistic AP VS Percent: 1 %
Brady Statistic AS VP Percent: 91 %
Brady Statistic AS VS Percent: 1 %
Brady Statistic RA Percent Paced: 7.2 %
Brady Statistic RV Percent Paced: 99 %
Date Time Interrogation Session: 20210909020020
Implantable Lead Implant Date: 20210609
Implantable Lead Implant Date: 20210609
Implantable Lead Location: 753859
Implantable Lead Location: 753860
Implantable Pulse Generator Implant Date: 20210609
Lead Channel Impedance Value: 450 Ohm
Lead Channel Impedance Value: 660 Ohm
Lead Channel Pacing Threshold Amplitude: 0.5 V
Lead Channel Pacing Threshold Amplitude: 0.625 V
Lead Channel Pacing Threshold Pulse Width: 0.4 ms
Lead Channel Pacing Threshold Pulse Width: 0.5 ms
Lead Channel Sensing Intrinsic Amplitude: 5 mV
Lead Channel Sensing Intrinsic Amplitude: 7.6 mV
Lead Channel Setting Pacing Amplitude: 0.875
Lead Channel Setting Pacing Amplitude: 3.5 V
Lead Channel Setting Pacing Pulse Width: 0.5 ms
Lead Channel Setting Sensing Sensitivity: 2 mV
Pulse Gen Model: 2272
Pulse Gen Serial Number: 3829143

## 2020-07-18 NOTE — Progress Notes (Signed)
Remote pacemaker transmission.   

## 2020-07-22 ENCOUNTER — Other Ambulatory Visit: Payer: Self-pay

## 2020-07-22 ENCOUNTER — Encounter: Payer: Self-pay | Admitting: Cardiology

## 2020-07-22 ENCOUNTER — Ambulatory Visit: Payer: PPO | Admitting: Cardiology

## 2020-07-22 VITALS — BP 112/74 | HR 75 | Ht 67.0 in | Wt 178.0 lb

## 2020-07-22 DIAGNOSIS — I442 Atrioventricular block, complete: Secondary | ICD-10-CM

## 2020-07-22 DIAGNOSIS — Z950 Presence of cardiac pacemaker: Secondary | ICD-10-CM | POA: Diagnosis not present

## 2020-07-22 NOTE — Patient Instructions (Signed)
Medication Instructions:  Your physician recommends that you continue on your current medications as directed. Please refer to the Current Medication list given to you today.  *If you need a refill on your cardiac medications before your next appointment, please call your pharmacy*   Lab Work: None ordered   Testing/Procedures: None ordered   Follow-Up: Remote monitoring is used to monitor your Pacemaker of ICD from home. This monitoring reduces the number of office visits required to check your device to one time per year. It allows [place] to keep an eye on the functioning of your device to ensure it is working properly. You are scheduled for a device check from home on 10/16/2020. You may send your transmission at any time that day. If you have a wireless device, the transmission will be sent automatically. After your physician reviews your transmission, you will receive a postcard with your next transmission date.  At [facility], you and your health needs are our priority.  As part of our continuing mission to provide you with exceptional heart care, we have created designated Provider Care Teams.  These Care Teams include your primary Cardiologist (physician) and Advanced Practice Providers (APPs -  Physician Assistants and Nurse Practitioners) who all work together to provide you with the care you need, when you need it.  We recommend signing up for the patient portal called "MyChart".  Sign up information is provided on this After Visit Summary.  MyChart is used to connect with patients for Virtual Visits (Telemedicine).  Patients are able to view lab/test results, encounter notes, upcoming appointments, etc.  Non-urgent messages can be sent to your provider as well.   To learn more about what you can do with MyChart, go to [email or web address].    Your next appointment:   9 month(s)  The format for your next appointment:   In Person  Provider:   Allegra Lai, MD   Thank you  for choosing [facility]!!   Trinidad Curet, RN [phone]    Other Instructions

## 2020-07-22 NOTE — Progress Notes (Signed)
Electrophysiology Office Note   Date:  07/22/2020   ID:  Corey Aguilar, DOB 01-28-1934, MRN [id number]  PCP:  Monico Blitz, MD  Cardiologist:  Angelena Form Primary Electrophysiologist:  Dakarai Mcglocklin Meredith Leeds, MD    Chief Complaint: pacemaker   History of Present Illness: Corey Aguilar is a 86 y.o. male who is being seen today for the evaluation of pacemaker at the request of Monico Blitz, MD. Presenting today for electrophysiology evaluation.  He has a history of left bundle branch block and severe AS. He underwent TAVR 04/15/2020. After his TAVR, he was noted to have complete heart block. He is now status post [employer] dual-chamber pacemaker implanted 04/16/2020.  Today, he denies symptoms of palpitations, chest pain, shortness of breath, orthopnea, PND, lower extremity edema, claudication, dizziness, presyncope, syncope, bleeding, or neurologic sequela. The patient is tolerating medications without difficulties.  Since his TAVR valve was implanted, he is unfortunately continued to have shortness of breath.  He is having no issues with his pacemaker.  He was diagnosed with a VSD and has a small perivalvular leak.  He has plans for repeat echocardiogram upcoming.  Aside from that, he is doing well without complaint.   Past Medical History:  Diagnosis Date  . Arthritis   . BCC (basal cell carcinoma of skin) 12/11/2014   v of neck (CX35FU)  . BCC (basal cell carcinoma of skin) 04/07/1989   left cheek  . History of kidney stones   . Left hip pain   . Lumbago   . S/P placement of cardiac pacemaker 04/16/2020   placed after CHB after TAVR   . S/P TAVR (transcatheter aortic valve replacement) 04/15/2020   s/p TAVR with a 26 mm Edwards S3U via the TF approach by Dr. Angelena Form and Roxy Manns  . SCC (squamous cell carcinoma) 07/20/2011   left forearm (CX35FU)  . SCC (squamous cell carcinoma) 10/05/2011   Left ear (CX35FU)  . SCC (squamous cell carcinoma) 08/22/2018   left jawline (CX35FU)    . Severe aortic stenosis   . Superficial basal cell carcinoma (BCC) 07/20/2011   tip of nose (CX35FU)   Past Surgical History:  Procedure Laterality Date  . BACK SURGERY  2015  . CARDIAC CATHETERIZATION  03/27/2020  . EYE SURGERY     cataract sx in both eyes  . JOINT REPLACEMENT Left    hip  . PACEMAKER IMPLANT N/A 04/16/2020   Procedure: PACEMAKER IMPLANT;  Surgeon: Constance Haw, MD;  Location: [place] [facility];  Service: Cardiovascular;  Laterality: N/A;  . RIGHT/LEFT HEART CATH AND CORONARY ANGIOGRAPHY N/A 03/27/2020   Procedure: RIGHT/LEFT HEART CATH AND CORONARY ANGIOGRAPHY;  Surgeon: Burnell Blanks, MD;  Location: [place] [facility];  Service: Cardiovascular;  Laterality: N/A;  . TEE WITHOUT CARDIOVERSION N/A 04/15/2020   Procedure: TRANSESOPHAGEAL ECHOCARDIOGRAM (TEE);  Surgeon: Burnell Blanks, MD;  Location: [facility];  Service: Open Heart Surgery;  Laterality: N/A;  . TOTAL HIP ARTHROPLASTY Left 05/29/2019   Procedure: TOTAL HIP ARTHROPLASTY ANTERIOR APPROACH;  Surgeon: Paralee Cancel, MD;  Location: [facility];  Service: Orthopedics;  Laterality: Left;  70 mins  . TRANSCATHETER AORTIC VALVE REPLACEMENT, TRANSFEMORAL N/A 04/15/2020   Procedure: TRANSCATHETER AORTIC VALVE REPLACEMENT, TRANSFEMORAL;  Surgeon: Burnell Blanks, MD;  Location: [place] [facility];  Service: Open Heart Surgery;  Laterality: N/A;     Current Outpatient Medications  Medication Sig Dispense Refill  . clopidogrel (PLAVIX) 75 MG tablet Take 1 tablet (75  mg total) by mouth daily with breakfast. 90 tablet 1  . furosemide (LASIX) 20 MG tablet Take 1 tablet (20 mg total) by mouth daily. 90 tablet 3  . Sennosides (EX-LAX) 15 MG CHEW Chew 15 mg by mouth daily as needed (constipation). Ex-lax     No current facility-administered medications for this visit.    Allergies:   Penicillins and Ibuprofen   Social History:  The patient  reports that he quit smoking about 64 years  ago. He has never used smokeless tobacco. He reports current alcohol use. He reports that he does not use drugs.   Family History:  The patient's family history includes Cancer in his brother and sister; Diabetes in his mother; Heart attack in his father; Heart disease in his mother.    ROS:  Please see the history of present illness.   Otherwise, review of systems is positive for none.   All other systems are reviewed and negative.    PHYSICAL EXAM: VS:  BP 112/74   Pulse 75   Ht 5\' 7"  (1.702 m)   Wt 178 lb (80.7 kg)   SpO2 94%   BMI 27.88 kg/m  , BMI Body mass index is 27.88 kg/m. GEN: Well nourished, well developed, in no acute distress  HEENT: normal  Neck: no JVD, carotid bruits, or masses Cardiac: RRR; no murmurs, rubs, or gallops,no edema  Respiratory:  clear to auscultation bilaterally, normal work of breathing GI: soft, nontender, nondistended, + BS MS: no deformity or atrophy  Skin: warm and dry, device pocket is well healed Neuro:  Strength and sensation are intact Psych: euthymic mood, full affect  EKG:  EKG is ordered today. Personal review of the ekg ordered shows sinus rhythm, regular paced, rate 75  Device interrogation is reviewed today in detail.  See PaceArt for details.   Recent Labs: 04/11/2020: ALT 11; B Natriuretic Peptide 208.2 04/16/2020: Magnesium 1.7 04/17/2020: BUN 15; Creatinine, Ser 0.83; Hemoglobin 12.6; Platelets 127; Potassium 3.9; Sodium 137    Lipid Panel  No results found for: CHOL, TRIG, HDL, CHOLHDL, VLDL, LDLCALC, LDLDIRECT   Wt Readings from Last 3 Encounters:  07/22/20 178 lb (80.7 kg)  05/22/20 178 lb 9.6 oz (81 kg)  05/01/20 175 lb 6.4 oz (79.6 kg)      Other studies Reviewed: Additional studies/ records that were reviewed today include: TTE 05/22/20  Review of the above records today demonstrates:  1. Left ventricular ejection fraction, by estimation, is 50 to 55%. The  left ventricle has low normal function. The left  ventricle demonstrates  regional wall motion abnormalities (see scoring diagram/findings for  description). Left ventricular diastolic  parameters are consistent with Grade I diastolic dysfunction (impaired  relaxation). There is moderate hypokinesis of the left ventricular,  basal-mid inferior wall.  2. Right ventricular systolic function is low normal. The right  ventricular size is normal. There is normal pulmonary artery systolic  pressure.  3. The aortic valve has been repaired/replaced. There is a 26 mm Ultra,  stented (TAVR) valve present in the aortic position. Procedure Date:  04/15/20. Echo findings are consistent with trivial perivalvular leak of the  aortic prosthesis. Possible  restrictive VSD of the perimembranous septum, below the valve prosthesis.  May be TAVR related.  4. Left atrial size was moderately dilated.  5. Tricuspid valve regurgitation is moderate.  6. The mitral valve is abnormal. Trivial mitral valve regurgitation.    ASSESSMENT AND PLAN:  1. Complete heart block: Occurred post TAVR. Patient  had a pre-existing left bundle branch block. He is now status post [employer] dual-chamber pacemaker implanted 04/16/2020. Device functioning appropriately. No changes at this time.  2. Severe aortic stenosis: Status post TAVR. Repeat echo shows stable TAVR valve with small perimembranous VSD. Plan for repeat echo per primary team.  3. Nonobstructive coronary artery disease: Stable without chest pain.  Current medicines are reviewed at length with the patient today.   The patient does not have concerns regarding his medicines.  The following changes were made today:  none  Labs/ tests ordered today include:  Orders Placed This Encounter  Procedures  . EKG 12-Lead     Disposition:   FU with Orlean Holtrop 9 months  Signed, Laurinda Carreno Meredith Leeds, MD  07/22/2020 2:53 PM     [facility] [address] [place] [address] [phone]  (office) [phone] (fax)

## 2020-08-01 ENCOUNTER — Telehealth: Payer: Self-pay | Admitting: Cardiovascular Disease

## 2020-08-01 NOTE — Telephone Encounter (Signed)
Will send to primary card for BP issue

## 2020-08-01 NOTE — Telephone Encounter (Signed)
Pt c/o BP issue: STAT if pt c/o blurred vision, one-sided weakness or slurred speech  1. What are your last 5 BP readings? 111/72 this morning  2. Are you having any other symptoms (ex. Dizziness, headache, blurred vision, passed out)? No   3. What is your BP issue? Think BP might be low

## 2020-08-04 NOTE — Telephone Encounter (Signed)
Wife advised that 111/72 BP is great. Patient denies dizziness, lightheadedness, fatigue or any other symptoms related to low BP. Verbalized understanding.

## 2020-08-13 ENCOUNTER — Ambulatory Visit: Payer: PPO | Admitting: Dermatology

## 2020-08-13 ENCOUNTER — Encounter: Payer: Self-pay | Admitting: Dermatology

## 2020-08-13 ENCOUNTER — Other Ambulatory Visit: Payer: Self-pay

## 2020-08-13 DIAGNOSIS — D489 Neoplasm of uncertain behavior, unspecified: Secondary | ICD-10-CM

## 2020-08-13 DIAGNOSIS — C44319 Basal cell carcinoma of skin of other parts of face: Secondary | ICD-10-CM | POA: Diagnosis not present

## 2020-08-13 DIAGNOSIS — L570 Actinic keratosis: Secondary | ICD-10-CM

## 2020-08-13 DIAGNOSIS — Z1283 Encounter for screening for malignant neoplasm of skin: Secondary | ICD-10-CM | POA: Diagnosis not present

## 2020-08-13 DIAGNOSIS — C4441 Basal cell carcinoma of skin of scalp and neck: Secondary | ICD-10-CM | POA: Diagnosis not present

## 2020-08-13 DIAGNOSIS — D485 Neoplasm of uncertain behavior of skin: Secondary | ICD-10-CM

## 2020-08-13 MED ORDER — TOLAK 4 % EX CREA
TOPICAL_CREAM | CUTANEOUS | 1 refills | Status: DC
Start: 2020-08-13 — End: 2021-05-22

## 2020-08-13 NOTE — Patient Instructions (Signed)

## 2020-08-18 ENCOUNTER — Telehealth: Payer: Self-pay | Admitting: *Deleted

## 2020-08-18 NOTE — Telephone Encounter (Signed)
Path to patients wife. Made him an appointment with Dr. Denna Haggard and told her that he will probably need another appointment to treat the other spot but we will make that at the time of the surgery.

## 2020-08-18 NOTE — Telephone Encounter (Signed)
-----   Message from Lavonna Monarch, MD sent at 08/16/2020 12:33 PM EDT ----- Dr. Darene Lamer will need to 56minute procedure times for these 2 lesions unless the patient expresses a preference to have Mohs surgery done.

## 2020-09-20 ENCOUNTER — Encounter: Payer: Self-pay | Admitting: Dermatology

## 2020-09-20 NOTE — Progress Notes (Signed)
   Follow-Up Visit   Subjective  Corey Aguilar is a 86 y.o. male who presents for the following: Skin Problem (left jawline, left post ear, left cheek- all sore no bleed).  New growths left ear and left jawline Location:  Duration:  Quality:  Associated Signs/Symptoms: Modifying Factors:  Severity:  Timing: Context: History of previous nonmelanoma skin cancer  Objective  Well appearing patient in no apparent distress; mood and affect are within normal limits.  All skin waist up examined.   Assessment & Plan    Neoplasm of uncertain behavior (2) Left Postauricular Sulcus  Skin / nail biopsy Type of biopsy: tangential   Informed consent: discussed and consent obtained   Timeout: patient name, date of birth, surgical site, and procedure verified   Anesthesia: the lesion was anesthetized in a standard fashion   Anesthetic:  1% lidocaine w/ epinephrine 1-100,000 local infiltration Instrument used: flexible razor blade   Hemostasis achieved with: ferric subsulfate   Outcome: patient tolerated procedure well   Post-procedure details: sterile dressing applied and wound care instructions given   Dressing type: bandage and petrolatum    Specimen 1 - Surgical pathology Differential Diagnosis: bcc vs scc Check Margins: No  Left Posterior Mandible  Skin / nail biopsy Type of biopsy: tangential   Informed consent: discussed and consent obtained   Timeout: patient name, date of birth, surgical site, and procedure verified   Anesthesia: the lesion was anesthetized in a standard fashion   Anesthetic:  1% lidocaine w/ epinephrine 1-100,000 local infiltration Instrument used: flexible razor blade   Hemostasis achieved with: ferric subsulfate   Outcome: patient tolerated procedure well   Post-procedure details: sterile dressing applied and wound care instructions given   Dressing type: bandage and petrolatum    Specimen 2 - Surgical pathology Differential Diagnosis: bcc vs  scc Check Margins: No  AK (actinic keratosis) (2) Mid Forehead; Left Buccal Cheek   Consider Tolak or PDT in the winter.  Encounter for screening for malignant neoplasm of skin Mid Back  Annual skin examination.     I, Lavonna Monarch, MD, have reviewed all documentation for this visit.  The documentation on 09/20/20 for the exam, diagnosis, procedures, and orders are all accurate and complete.

## 2020-10-16 ENCOUNTER — Ambulatory Visit: Payer: PPO

## 2020-10-16 ENCOUNTER — Ambulatory Visit (INDEPENDENT_AMBULATORY_CARE_PROVIDER_SITE_OTHER): Payer: PPO

## 2020-10-16 DIAGNOSIS — I442 Atrioventricular block, complete: Secondary | ICD-10-CM

## 2020-10-16 LAB — CUP PACEART REMOTE DEVICE CHECK
Battery Remaining Longevity: 125 mo
Battery Remaining Percentage: 95.5 %
Battery Voltage: 3.02 V
Brady Statistic AP VP Percent: 4.2 %
Brady Statistic AP VS Percent: 1 %
Brady Statistic AS VP Percent: 95 %
Brady Statistic AS VS Percent: 1 %
Brady Statistic RA Percent Paced: 3.5 %
Brady Statistic RV Percent Paced: 99 %
Date Time Interrogation Session: 20211209053131
Implantable Lead Implant Date: 20210609
Implantable Lead Implant Date: 20210609
Implantable Lead Location: 753859
Implantable Lead Location: 753860
Implantable Pulse Generator Implant Date: 20210609
Lead Channel Impedance Value: 440 Ohm
Lead Channel Impedance Value: 650 Ohm
Lead Channel Pacing Threshold Amplitude: 0.5 V
Lead Channel Pacing Threshold Amplitude: 0.625 V
Lead Channel Pacing Threshold Pulse Width: 0.4 ms
Lead Channel Pacing Threshold Pulse Width: 0.5 ms
Lead Channel Sensing Intrinsic Amplitude: 12 mV
Lead Channel Sensing Intrinsic Amplitude: 5 mV
Lead Channel Setting Pacing Amplitude: 0.875
Lead Channel Setting Pacing Amplitude: 2 V
Lead Channel Setting Pacing Pulse Width: 0.5 ms
Lead Channel Setting Sensing Sensitivity: 2 mV
Pulse Gen Model: 2272
Pulse Gen Serial Number: 3829143

## 2020-10-17 LAB — CUP PACEART REMOTE DEVICE CHECK
Battery Remaining Longevity: 125 mo
Battery Remaining Percentage: 95.5 %
Battery Voltage: 3.02 V
Brady Statistic AP VP Percent: 4.2 %
Brady Statistic AP VS Percent: 1 %
Brady Statistic AS VP Percent: 95 %
Brady Statistic AS VS Percent: 1 %
Brady Statistic RA Percent Paced: 3.5 %
Brady Statistic RV Percent Paced: 99 %
Date Time Interrogation Session: 20211209053131
Implantable Lead Implant Date: 20210609
Implantable Lead Implant Date: 20210609
Implantable Lead Location: 753859
Implantable Lead Location: 753860
Implantable Pulse Generator Implant Date: 20210609
Lead Channel Impedance Value: 440 Ohm
Lead Channel Impedance Value: 650 Ohm
Lead Channel Pacing Threshold Amplitude: 0.5 V
Lead Channel Pacing Threshold Amplitude: 0.625 V
Lead Channel Pacing Threshold Pulse Width: 0.4 ms
Lead Channel Pacing Threshold Pulse Width: 0.5 ms
Lead Channel Sensing Intrinsic Amplitude: 12 mV
Lead Channel Sensing Intrinsic Amplitude: 5 mV
Lead Channel Setting Pacing Amplitude: 0.875
Lead Channel Setting Pacing Amplitude: 2 V
Lead Channel Setting Pacing Pulse Width: 0.5 ms
Lead Channel Setting Sensing Sensitivity: 2 mV
Pulse Gen Model: 2272
Pulse Gen Serial Number: 3829143

## 2020-10-23 ENCOUNTER — Other Ambulatory Visit: Payer: Self-pay

## 2020-10-23 ENCOUNTER — Encounter: Payer: Self-pay | Admitting: Dermatology

## 2020-10-23 ENCOUNTER — Ambulatory Visit (INDEPENDENT_AMBULATORY_CARE_PROVIDER_SITE_OTHER): Payer: PPO | Admitting: Dermatology

## 2020-10-23 DIAGNOSIS — C44319 Basal cell carcinoma of skin of other parts of face: Secondary | ICD-10-CM | POA: Diagnosis not present

## 2020-10-23 DIAGNOSIS — C4491 Basal cell carcinoma of skin, unspecified: Secondary | ICD-10-CM

## 2020-10-23 NOTE — Progress Notes (Signed)
   Follow-Up Visit   Subjective  Corey Aguilar is a 86 y.o. male who presents for the following: Procedure.  Skin cancers Location: Left mandible and back of left ear Duration:  Quality:  Associated Signs/Symptoms: Modifying Factors:  Severity:  Timing: Context: We will treat jawline lesion today.  Objective  Well appearing patient in no apparent distress; mood and affect are within normal limits. Objective  Left Posterior Mandible: Biopsy site identified by nurse, patient, and me.   A focused examination was performed including Head and neck.. Relevant physical exam findings are noted in the Assessment and Plan.   Assessment & Plan    Basal cell carcinoma (BCC), unspecified site Left Posterior Mandible  Destruction of lesion Complexity: simple   Destruction method: electrodesiccation and curettage   Informed consent: discussed and consent obtained   Timeout:  patient name, date of birth, surgical site, and procedure verified Anesthesia: the lesion was anesthetized in a standard fashion   Anesthetic:  1% lidocaine w/ epinephrine 1-100,000 local infiltration Curettage performed in three different directions: Yes   Electrodesiccation performed over the curetted area: Yes   Curettage cycles:  3 Lesion length (cm):  2.1 Lesion width (cm):  1.2 Margin per side (cm):  0.1 Final wound size (cm):  2.3 Hemostasis achieved with:  suture and ferric subsulfate Outcome: patient tolerated procedure well with no complications   Additional details:  Wound innoculated with 5 fluorouracil solution.  Skin excision  Lesion length (cm):  2.1 Lesion width (cm):  1.2 Margin per side (cm):  0.1 Total excision diameter (cm):  2.3 Informed consent: discussed and consent obtained   Timeout: patient name, date of birth, surgical site, and procedure verified   Anesthesia: the lesion was anesthetized in a standard fashion   Anesthetic:  1% lidocaine w/ epinephrine 1-100,000 local  infiltration Instrument used: #15 blade   Hemostasis achieved with: suture, pressure and electrodesiccation   Outcome: patient tolerated procedure well with no complications   Post-procedure details: sterile dressing applied and wound care instructions given   Dressing type: bandage, petrolatum and pressure dressing    Skin excision  Specimen 1 - Surgical pathology Differential Diagnosis: bcc [id number] Check Margins: No Superior Margin stained Vicryl x 0 4-0 Ethilion x 3   Curettage x3 showed lesion to be focally deep as well as moderately wide.  After electrocautery and recuretting of the base, the base was treated with parenteral 5-fluorouracil and the deep area excised with narrow margin and simple closure.  He requested that his niece be able to remove the stitches which she can do when 6 to 10 days.  He knows he can call me if there is any issues with the wound.  Follow up in 3 months.     I, Lavonna Monarch, MD, have reviewed all documentation for this visit.  The documentation on 10/23/20 for the exam, diagnosis, procedures, and orders are all accurate and complete.

## 2020-10-28 NOTE — Addendum Note (Signed)
Addended by: Lavonna Monarch on: 10/28/2020 05:56 AM   Modules accepted: Orders

## 2020-10-28 NOTE — Progress Notes (Signed)
Remote pacemaker transmission.   

## 2020-10-29 ENCOUNTER — Telehealth: Payer: Self-pay | Admitting: *Deleted

## 2020-10-29 NOTE — Telephone Encounter (Signed)
Path report to patients wife. Patient has a second surgery appointment is February to have another lesion treated.

## 2020-10-29 NOTE — Telephone Encounter (Signed)
-----   Message from Lavonna Monarch, MD sent at 10/28/2020  5:54 AM EST ----- Excision showed clear, narrow margins and this lesion can be watched.  He should be scheduled for surgery for a second lesion behind the ear.

## 2020-10-30 ENCOUNTER — Other Ambulatory Visit: Payer: Self-pay

## 2020-10-30 ENCOUNTER — Ambulatory Visit (INDEPENDENT_AMBULATORY_CARE_PROVIDER_SITE_OTHER): Payer: PPO

## 2020-10-30 DIAGNOSIS — Z4802 Encounter for removal of sutures: Secondary | ICD-10-CM

## 2020-10-30 NOTE — Progress Notes (Signed)
Patient here today for nurse to see for suture removal x 3 sutures removed healing well.

## 2020-11-09 ENCOUNTER — Encounter: Payer: Self-pay | Admitting: Cardiology

## 2020-11-09 NOTE — Progress Notes (Signed)
Cardiology Office Note  Date: 11/10/2020   ID: Corey Aguilar, DOB Dec 27, 1933, MRN [id number]  PCP:  Kirstie Peri, MD  Cardiologist:  Nona Dell, MD Electrophysiologist:  Regan Lemming, MD   Chief Complaint  Patient presents with  . Cardiac follow-up    History of Present Illness: Corey Aguilar is an 87 y.o. male former patient of Dr. Purvis Sheffield now presenting to establish follow-up with me.  I reviewed his records and updated the chart.  He presents for a routine visit.  Reports stable dyspnea on exertion, NYHA class II-III, no chest pain.  He is limited by chronic leg pain, has foot drop on the right, uses a cane.  He does not report any recent falls.  He had follow-up with Dr. Elberta Fortis in September 2021, history of complete heart block following TAVR in July 2021, status post placement of [employer]. Jude pacemaker.  Recent device check indicated normal function.    He had prior follow-up in the valve clinic back in July 2021.  I reviewed his echocardiogram results from 12/10/2019, follow-up study is pending for later this month.  I reviewed his medications which are fairly minimal.  He states that he is not taking anything for his lipids, currently not on an antihypertensive.  I asked him to check his blood pressure with automatic cuff at home.  Today's measurement was elevated.  Past Medical History:  Diagnosis Date  . Aortic stenosis   . Arthritis   . BCC (basal cell carcinoma of skin) 12/11/2014   V of neck (CX35FU)  . BCC (basal cell carcinoma of skin) 04/07/1989   Left cheek  . CAD (coronary artery disease)    Moderate, multivessel disease by cardiac catheterization May 2021  . History of kidney stones   . Left hip pain   . Lumbago   . S/P placement of cardiac pacemaker 04/16/2020   Complete heart block, [employer]. Jude device  . S/P TAVR (transcatheter aortic valve replacement) 04/15/2020   s/p TAVR with a 26 mm Edwards S3U via the TF approach by Dr. Clifton James and  Cornelius Moras  . SCC (squamous cell carcinoma) 07/20/2011   Left forearm (CX35FU)  . SCC (squamous cell carcinoma) 10/05/2011   Left ear (CX35FU)  . SCC (squamous cell carcinoma) 08/22/2018   Left jawline (CX35FU)  . Superficial basal cell carcinoma (BCC) 07/20/2011   Tip of nose (CX35FU)    Past Surgical History:  Procedure Laterality Date  . BACK SURGERY  2015  . CARDIAC CATHETERIZATION  03/27/2020  . EYE SURGERY     cataract sx in both eyes  . JOINT REPLACEMENT Left    hip  . PACEMAKER IMPLANT N/A 04/16/2020   Procedure: PACEMAKER IMPLANT;  Surgeon: Regan Lemming, MD;  Location: [facility];  Service: Cardiovascular;  Laterality: N/A;  . RIGHT/LEFT HEART CATH AND CORONARY ANGIOGRAPHY N/A 03/27/2020   Procedure: RIGHT/LEFT HEART CATH AND CORONARY ANGIOGRAPHY;  Surgeon: Kathleene Hazel, MD;  Location: [facility];  Service: Cardiovascular;  Laterality: N/A;  . TEE WITHOUT CARDIOVERSION N/A 04/15/2020   Procedure: TRANSESOPHAGEAL ECHOCARDIOGRAM (TEE);  Surgeon: Kathleene Hazel, MD;  [facility];  Service: Open Heart Surgery;  Laterality: N/A;  . TOTAL HIP ARTHROPLASTY Left 05/29/2019   Procedure: TOTAL HIP ARTHROPLASTY ANTERIOR APPROACH;  Surgeon: Durene Romans, MD;  Location: [facility];  Service: Orthopedics;  Laterality: Left;  70 mins  . TRANSCATHETER AORTIC VALVE REPLACEMENT, TRANSFEMORAL N/A 04/15/2020   Procedure: TRANSCATHETER  AORTIC VALVE REPLACEMENT, TRANSFEMORAL;  Surgeon: Kathleene Hazel, MD;  Location: [facility];  Service: Open Heart Surgery;  Laterality: N/A;    Current Outpatient Medications  Medication Sig Dispense Refill  . Fluorouracil (TOLAK) 4 % CREA Apply Topically at affect area daily 40 g 1  . Sennosides (EX-LAX) 15 MG CHEW Chew 15 mg by mouth daily as needed (constipation). Ex-lax     No current facility-administered medications for this visit.   Allergies:  Penicillins and Ibuprofen   ROS: No dizziness  or syncope.  Physical Exam: VS:  BP (!) 166/88   Pulse 70   Resp 16   Ht 5\' 6"  (1.676 m)   Wt 184 lb (83.5 kg)   SpO2 94%   BMI 29.70 kg/m , BMI Body mass index is 29.7 kg/m.  Wt Readings from Last 3 Encounters:  11/10/20 184 lb (83.5 kg)  07/22/20 178 lb (80.7 kg)  05/22/20 178 lb 9.6 oz (81 kg)    General: Elderly male, appears comfortable at rest. HEENT: Conjunctiva and lids normal, wearing a mask. Neck: Supple, no elevated JVP or carotid bruits, no thyromegaly. Lungs: Clear to auscultation, nonlabored breathing at rest. Cardiac: Regular rate and rhythm, no S3, 2/6 systolic murmur, no pericardial rub. Extremities: No pitting edema.  ECG:  An ECG dated 07/22/2020 was personally reviewed today and demonstrated:  Sinus rhythm with left bundle branch block.  Recent Labwork: 04/11/2020: ALT 11; AST 19; B Natriuretic Peptide 208.2 04/16/2020: Magnesium 1.7 04/17/2020: BUN 15; Creatinine, Ser 0.83; Hemoglobin 12.6; Platelets 127; Potassium 3.9; Sodium 137   Other Studies Reviewed Today:  Cardiac catheterization 03/27/2020:  1st Mrg lesion is 50% stenosed.  Ost Cx to Prox Cx lesion is 20% stenosed.  Prox LAD lesion is 30% stenosed.  1st Diag lesion is 60% stenosed.  2nd Diag lesion is 60% stenosed.  Mid LAD lesion is 60% stenosed.  Prox RCA lesion is 70% stenosed.   1. Moderate non-obstructive disease in the mid LAD, second Diagonal branch and first obtuse marginal branch.  2. Moderately severe stenosis in the small to moderate caliber, non-dominant RCA 3. Severe aortic stenosis by echo. I did not cross the aortic valve today.   Echocardiogram 05/22/2020: 1. Left ventricular ejection fraction, by estimation, is 50 to 55%. The  left ventricle has low normal function. The left ventricle demonstrates  regional wall motion abnormalities (see scoring diagram/findings for  description). Left ventricular diastolic  parameters are consistent with Grade I diastolic dysfunction  (impaired  relaxation). There is moderate hypokinesis of the left ventricular,  basal-mid inferior wall.  2. Right ventricular systolic function is low normal. The right  ventricular size is normal. There is normal pulmonary artery systolic  pressure.  3. The aortic valve has been repaired/replaced. There is a 26 mm Ultra,  stented (TAVR) valve present in the aortic position. Procedure Date:  04/15/20. Echo findings are consistent with trivial perivalvular leak of the  aortic prosthesis. Possible  restrictive VSD of the perimembranous septum, below the valve prosthesis.  May be TAVR related.  4. Left atrial size was moderately dilated.  5. Tricuspid valve regurgitation is moderate.  6. The mitral valve is abnormal. Trivial mitral valve regurgitation.   Assessment and Plan:  1.  History of severe aortic stenosis status post TAVR in June 2021.  He has a trivial perivalvular leak and perimembranous VSD, last assessed by echocardiogram in July 2021 with repeat study pending for later this month.  No longer on antiplatelet  therapy.  2.  Moderate, multivessel CAD by pre-TAVR cardiac catheterization, no active angina.  Requesting lipid panel from PCP.  He is not on statin therapy.  3.  Elevated blood pressure, not on antihypertensive therapy.  I asked him to check blood pressure with automatic cuff at home.  4.  Complete heart block following TAVR, status post St. Jude pacemaker and clinically stable at this point.  He is followed by Dr. Curt Bears.  Medication Adjustments/Labs and Tests Ordered: Current medicines are reviewed at length with the patient today.  Concerns regarding medicines are outlined above.   Tests Ordered: No orders of the defined types were placed in this encounter.   Medication Changes: No orders of the defined types were placed in this encounter.   Disposition:  Follow up 6 months in the [place] office.  Signed, Satira Sark, MD, [facility] 11/10/2020 12:13 PM     [facility] at [place], [place], [place] [address] Phone: [phone]; Fax: [phone]

## 2020-11-10 ENCOUNTER — Ambulatory Visit (INDEPENDENT_AMBULATORY_CARE_PROVIDER_SITE_OTHER): Payer: PPO | Admitting: Cardiology

## 2020-11-10 ENCOUNTER — Encounter: Payer: Self-pay | Admitting: Cardiology

## 2020-11-10 ENCOUNTER — Other Ambulatory Visit: Payer: Self-pay

## 2020-11-10 VITALS — BP 166/88 | HR 70 | Resp 16 | Ht 66.0 in | Wt 184.0 lb

## 2020-11-10 DIAGNOSIS — Z952 Presence of prosthetic heart valve: Secondary | ICD-10-CM

## 2020-11-10 DIAGNOSIS — I2510 Atherosclerotic heart disease of native coronary artery without angina pectoris: Secondary | ICD-10-CM

## 2020-11-10 DIAGNOSIS — I442 Atrioventricular block, complete: Secondary | ICD-10-CM | POA: Diagnosis not present

## 2020-11-10 NOTE — Patient Instructions (Signed)

## 2020-11-11 ENCOUNTER — Telehealth: Payer: Self-pay

## 2020-11-11 MED ORDER — ROSUVASTATIN CALCIUM 5 MG PO TABS
5.0000 mg | ORAL_TABLET | Freq: Every day | ORAL | 3 refills | Status: DC
Start: 2020-11-11 — End: 2022-05-25

## 2020-11-11 NOTE — Telephone Encounter (Signed)
I spoke with wife as patient was unavailable. Wife states husband stopped ASA because he didn't like how it made him feel.She did ask that we call in Crestor 5 mg daily at dinner to local pharmacy.

## 2020-11-11 NOTE — Telephone Encounter (Signed)
-----   Message from Jonelle Sidle, MD sent at 11/11/2020 10:29 AM EST ----- Results reviewed.  Please let him know that I reviewed his lipid panel from Dr. Sherryll Burger, LDL was 118 in 2020.  With coronary artery disease/vascular disease he should at least be on low-dose statin therapy.  If he is willing please have him start on Crestor 5 mg daily, also aspirin 81 mg daily.

## 2020-11-17 ENCOUNTER — Other Ambulatory Visit: Payer: Self-pay

## 2020-11-17 ENCOUNTER — Ambulatory Visit (HOSPITAL_COMMUNITY)
Admission: RE | Admit: 2020-11-17 | Discharge: 2020-11-17 | Disposition: A | Discharge status: Home / Self Care | Payer: PPO | Source: Ambulatory Visit | Attending: Physician Assistant | Admitting: Physician Assistant

## 2020-11-17 DIAGNOSIS — Q210 Ventricular septal defect: Secondary | ICD-10-CM | POA: Diagnosis not present

## 2020-11-17 DIAGNOSIS — Z952 Presence of prosthetic heart valve: Secondary | ICD-10-CM

## 2020-11-17 LAB — ECHOCARDIOGRAM COMPLETE
AR max vel: 1.26 cm2
AV Area VTI: 1.17 cm2
AV Area mean vel: 1.22 cm2
AV Mean grad: 16.9 mmHg
AV Peak grad: 28.6 mmHg
Ao pk vel: 2.68 m/s
Area-P 1/2: 2.42 cm2
P 1/2 time: 387 msec
S' Lateral: 3.2 cm

## 2020-11-17 NOTE — Progress Notes (Signed)
*  PRELIMINARY RESULTS* Echocardiogram 2D Echocardiogram has been performed.  Corey Aguilar 11/17/2020, 11:48 AM

## 2020-12-17 ENCOUNTER — Other Ambulatory Visit: Payer: Self-pay

## 2020-12-17 MED ORDER — ASPIRIN EC 81 MG PO TBEC
81.0000 mg | DELAYED_RELEASE_TABLET | Freq: Every day | ORAL | 3 refills | Status: DC
Start: 2020-12-17 — End: 2021-05-22

## 2020-12-18 ENCOUNTER — Other Ambulatory Visit: Payer: Self-pay

## 2020-12-18 ENCOUNTER — Ambulatory Visit (INDEPENDENT_AMBULATORY_CARE_PROVIDER_SITE_OTHER): Payer: PPO | Admitting: Dermatology

## 2020-12-18 ENCOUNTER — Encounter: Payer: Self-pay | Admitting: Dermatology

## 2020-12-18 DIAGNOSIS — D0422 Carcinoma in situ of skin of left ear and external auricular canal: Secondary | ICD-10-CM

## 2020-12-18 DIAGNOSIS — D099 Carcinoma in situ, unspecified: Secondary | ICD-10-CM

## 2020-12-18 DIAGNOSIS — D485 Neoplasm of uncertain behavior of skin: Secondary | ICD-10-CM

## 2020-12-18 NOTE — Patient Instructions (Signed)

## 2020-12-21 ENCOUNTER — Encounter: Payer: Self-pay | Admitting: Dermatology

## 2020-12-29 ENCOUNTER — Telehealth: Payer: Self-pay | Admitting: Dermatology

## 2020-12-29 NOTE — Telephone Encounter (Signed)
Path to patient and he has a follow appointment already

## 2020-12-29 NOTE — Telephone Encounter (Signed)
Results, ST 

## 2021-01-07 DIAGNOSIS — M1611 Unilateral primary osteoarthritis, right hip: Secondary | ICD-10-CM | POA: Diagnosis not present

## 2021-01-07 DIAGNOSIS — M1712 Unilateral primary osteoarthritis, left knee: Secondary | ICD-10-CM | POA: Diagnosis not present

## 2021-01-07 DIAGNOSIS — M21371 Foot drop, right foot: Secondary | ICD-10-CM | POA: Diagnosis not present

## 2021-01-07 DIAGNOSIS — Z96642 Presence of left artificial hip joint: Secondary | ICD-10-CM | POA: Diagnosis not present

## 2021-01-14 ENCOUNTER — Ambulatory Visit (INDEPENDENT_AMBULATORY_CARE_PROVIDER_SITE_OTHER): Payer: PPO

## 2021-01-14 DIAGNOSIS — I442 Atrioventricular block, complete: Secondary | ICD-10-CM

## 2021-01-16 LAB — CUP PACEART REMOTE DEVICE CHECK
Battery Remaining Longevity: 122 mo
Battery Remaining Percentage: 95.5 %
Battery Voltage: 3.01 V
Brady Statistic AP VP Percent: 4.7 %
Brady Statistic AP VS Percent: 1 %
Brady Statistic AS VP Percent: 94 %
Brady Statistic AS VS Percent: 1 %
Brady Statistic RA Percent Paced: 4 %
Brady Statistic RV Percent Paced: 99 %
Date Time Interrogation Session: 20220309021110
Implantable Lead Implant Date: 20210609
Implantable Lead Implant Date: 20210609
Implantable Lead Location: 753859
Implantable Lead Location: 753860
Implantable Pulse Generator Implant Date: 20210609
Lead Channel Impedance Value: 410 Ohm
Lead Channel Impedance Value: 600 Ohm
Lead Channel Pacing Threshold Amplitude: 0.5 V
Lead Channel Pacing Threshold Amplitude: 0.625 V
Lead Channel Pacing Threshold Pulse Width: 0.4 ms
Lead Channel Pacing Threshold Pulse Width: 0.5 ms
Lead Channel Sensing Intrinsic Amplitude: 12 mV
Lead Channel Sensing Intrinsic Amplitude: 5 mV
Lead Channel Setting Pacing Amplitude: 0.875
Lead Channel Setting Pacing Amplitude: 2 V
Lead Channel Setting Pacing Pulse Width: 0.5 ms
Lead Channel Setting Sensing Sensitivity: 2 mV
Pulse Gen Model: 2272
Pulse Gen Serial Number: 3829143

## 2021-01-22 NOTE — Progress Notes (Signed)
Remote pacemaker transmission.   

## 2021-02-06 DIAGNOSIS — Z299 Encounter for prophylactic measures, unspecified: Secondary | ICD-10-CM | POA: Diagnosis not present

## 2021-02-06 DIAGNOSIS — Z789 Other specified health status: Secondary | ICD-10-CM | POA: Diagnosis not present

## 2021-02-06 DIAGNOSIS — I350 Nonrheumatic aortic (valve) stenosis: Secondary | ICD-10-CM | POA: Diagnosis not present

## 2021-02-06 DIAGNOSIS — J209 Acute bronchitis, unspecified: Secondary | ICD-10-CM | POA: Diagnosis not present

## 2021-03-02 ENCOUNTER — Other Ambulatory Visit: Payer: Self-pay | Admitting: Physician Assistant

## 2021-03-02 DIAGNOSIS — Z952 Presence of prosthetic heart valve: Secondary | ICD-10-CM

## 2021-03-17 ENCOUNTER — Other Ambulatory Visit: Payer: Self-pay

## 2021-03-17 ENCOUNTER — Ambulatory Visit: Payer: PPO | Admitting: Dermatology

## 2021-03-17 ENCOUNTER — Encounter: Payer: Self-pay | Admitting: Dermatology

## 2021-03-17 DIAGNOSIS — L821 Other seborrheic keratosis: Secondary | ICD-10-CM | POA: Diagnosis not present

## 2021-03-17 DIAGNOSIS — Z1283 Encounter for screening for malignant neoplasm of skin: Secondary | ICD-10-CM | POA: Diagnosis not present

## 2021-03-17 DIAGNOSIS — L570 Actinic keratosis: Secondary | ICD-10-CM | POA: Diagnosis not present

## 2021-03-17 DIAGNOSIS — C44319 Basal cell carcinoma of skin of other parts of face: Secondary | ICD-10-CM | POA: Diagnosis not present

## 2021-03-17 DIAGNOSIS — D485 Neoplasm of uncertain behavior of skin: Secondary | ICD-10-CM

## 2021-03-17 NOTE — Patient Instructions (Signed)

## 2021-03-18 DIAGNOSIS — G5603 Carpal tunnel syndrome, bilateral upper limbs: Secondary | ICD-10-CM | POA: Diagnosis not present

## 2021-03-18 DIAGNOSIS — G5602 Carpal tunnel syndrome, left upper limb: Secondary | ICD-10-CM | POA: Diagnosis not present

## 2021-03-18 DIAGNOSIS — G5601 Carpal tunnel syndrome, right upper limb: Secondary | ICD-10-CM | POA: Diagnosis not present

## 2021-03-18 DIAGNOSIS — M13849 Other specified arthritis, unspecified hand: Secondary | ICD-10-CM | POA: Diagnosis not present

## 2021-03-18 DIAGNOSIS — R200 Anesthesia of skin: Secondary | ICD-10-CM | POA: Diagnosis not present

## 2021-03-23 ENCOUNTER — Telehealth: Payer: Self-pay

## 2021-03-23 NOTE — Telephone Encounter (Signed)
-----   Message from Lavonna Monarch, MD sent at 03/21/2021  8:29 AM EDT ----- Schedule surgery with Dr. Darene Lamer

## 2021-03-23 NOTE — Telephone Encounter (Signed)
Phone call to patient with his pathology results.  Path to patient. 

## 2021-03-29 ENCOUNTER — Encounter: Payer: Self-pay | Admitting: Dermatology

## 2021-03-29 NOTE — Progress Notes (Signed)
   Follow-Up Visit   Subjective  Corey Aguilar is a 87 y.o. male who presents for the following: Follow-up (NO REAL CONCERNS, LESION NEAR HIS PACEMAKER POSSIBLE ISK).  General skin check, 1 crust near pacemaker site Location:  Duration:  Quality:  Associated Signs/Symptoms: Modifying Factors:  Severity:  Timing: Context:   Objective  Well appearing patient in no apparent distress; mood and affect are within normal limits. Objective  Mid Chin: Waxy pink ill-defined 7 mm lesion, rule out BCC disease possibly infiltrative)       Objective  Left Superior Crus of Antihelix (3), Right Superior Crus of Antihelix (2): Hornlike pink 3 mm crusts  Objective  Left Breast: Agree with the patient's opinion that the subtle textured pink 6 mm flattopped papule near his pacemaker fits a slightly inflamed but benign keratosis  Objective  Mid Back: No atypical pigmented lesions waist up    All skin waist up examined.   Assessment & Plan    Neoplasm of uncertain behavior of skin Mid Chin  Skin / nail biopsy Type of biopsy: tangential   Informed consent: discussed and consent obtained   Timeout: patient name, date of birth, surgical site, and procedure verified   Procedure prep:  Patient was prepped and draped in usual sterile fashion (Non sterile) Prep type:  Chlorhexidine Anesthesia: the lesion was anesthetized in a standard fashion   Anesthetic:  1% lidocaine w/ epinephrine 1-100,000 local infiltration Instrument used: flexible razor blade   Hemostasis achieved with: ferric subsulfate   Outcome: patient tolerated procedure well   Post-procedure details: sterile dressing applied and wound care instructions given   Dressing type: bandage and petrolatum    Specimen 1 - Surgical pathology Differential Diagnosis: R/O BCC vs SCC  Check Margins: No  AK (actinic keratosis) (5) Left Superior Crus of Antihelix (3); Right Superior Crus of Antihelix (2)  Destruction of  lesion - Left Superior Crus of Antihelix, Right Superior Crus of Antihelix Complexity: simple   Destruction method: cryotherapy   Informed consent: discussed and consent obtained   Lesion destroyed using liquid nitrogen: Yes   Cryotherapy cycles:  3 Outcome: patient tolerated procedure well with no complications    Seborrheic keratosis Left Breast  Recheck as needed change  Encounter for screening for malignant neoplasm of skin Mid Back  Annual skin examination      I, Lavonna Monarch, MD, have reviewed all documentation for this visit.  The documentation on 03/29/21 for the exam, diagnosis, procedures, and orders are all accurate and complete.

## 2021-04-15 ENCOUNTER — Ambulatory Visit (INDEPENDENT_AMBULATORY_CARE_PROVIDER_SITE_OTHER): Payer: PPO

## 2021-04-15 DIAGNOSIS — I442 Atrioventricular block, complete: Secondary | ICD-10-CM | POA: Diagnosis not present

## 2021-04-15 LAB — CUP PACEART REMOTE DEVICE CHECK
Battery Remaining Longevity: 117 mo
Battery Remaining Percentage: 95.5 %
Battery Voltage: 3.01 V
Brady Statistic AP VP Percent: 5.3 %
Brady Statistic AP VS Percent: 1 %
Brady Statistic AS VP Percent: 93 %
Brady Statistic AS VS Percent: 1 %
Brady Statistic RA Percent Paced: 4.6 %
Brady Statistic RV Percent Paced: 99 %
Date Time Interrogation Session: 20220608020011
Implantable Lead Implant Date: 20210609
Implantable Lead Implant Date: 20210609
Implantable Lead Location: 753859
Implantable Lead Location: 753860
Implantable Pulse Generator Implant Date: 20210609
Lead Channel Impedance Value: 440 Ohm
Lead Channel Impedance Value: 510 Ohm
Lead Channel Pacing Threshold Amplitude: 0.5 V
Lead Channel Pacing Threshold Amplitude: 0.75 V
Lead Channel Pacing Threshold Pulse Width: 0.4 ms
Lead Channel Pacing Threshold Pulse Width: 0.5 ms
Lead Channel Sensing Intrinsic Amplitude: 5 mV
Lead Channel Sensing Intrinsic Amplitude: 7.6 mV
Lead Channel Setting Pacing Amplitude: 1 V
Lead Channel Setting Pacing Amplitude: 2 V
Lead Channel Setting Pacing Pulse Width: 0.5 ms
Lead Channel Setting Sensing Sensitivity: 2 mV
Pulse Gen Model: 2272
Pulse Gen Serial Number: 3829143

## 2021-04-21 ENCOUNTER — Encounter: Payer: Self-pay | Admitting: Cardiology

## 2021-04-21 DIAGNOSIS — Z0000 Encounter for general adult medical examination without abnormal findings: Secondary | ICD-10-CM | POA: Diagnosis not present

## 2021-04-21 DIAGNOSIS — Z1331 Encounter for screening for depression: Secondary | ICD-10-CM | POA: Diagnosis not present

## 2021-04-21 DIAGNOSIS — Z1339 Encounter for screening examination for other mental health and behavioral disorders: Secondary | ICD-10-CM | POA: Diagnosis not present

## 2021-04-21 DIAGNOSIS — Z6828 Body mass index (BMI) 28.0-28.9, adult: Secondary | ICD-10-CM | POA: Diagnosis not present

## 2021-04-21 DIAGNOSIS — Z125 Encounter for screening for malignant neoplasm of prostate: Secondary | ICD-10-CM | POA: Diagnosis not present

## 2021-04-21 DIAGNOSIS — Z79899 Other long term (current) drug therapy: Secondary | ICD-10-CM | POA: Diagnosis not present

## 2021-04-21 DIAGNOSIS — E7800 Pure hypercholesterolemia, unspecified: Secondary | ICD-10-CM | POA: Diagnosis not present

## 2021-04-21 DIAGNOSIS — Z299 Encounter for prophylactic measures, unspecified: Secondary | ICD-10-CM | POA: Diagnosis not present

## 2021-04-21 DIAGNOSIS — Z7189 Other specified counseling: Secondary | ICD-10-CM | POA: Diagnosis not present

## 2021-04-21 DIAGNOSIS — D692 Other nonthrombocytopenic purpura: Secondary | ICD-10-CM | POA: Diagnosis not present

## 2021-04-21 DIAGNOSIS — M21371 Foot drop, right foot: Secondary | ICD-10-CM | POA: Diagnosis not present

## 2021-04-21 DIAGNOSIS — R5383 Other fatigue: Secondary | ICD-10-CM | POA: Diagnosis not present

## 2021-04-24 ENCOUNTER — Telehealth: Payer: Self-pay | Admitting: Emergency Medicine

## 2021-04-24 NOTE — Telephone Encounter (Signed)
   Attempted to contact patient in reference to A HVR rate alert. Patient states no obvious symptoms noted. Will continue to monitor. Sending to Dr. Curt Bears Just [place].

## 2021-04-27 ENCOUNTER — Telehealth: Payer: Self-pay

## 2021-04-27 MED ORDER — METOPROLOL SUCCINATE ER 50 MG PO TB24: 50.0000 mg | ORAL_TABLET | Freq: Every day | ORAL | 3 refills | Status: DC

## 2021-04-27 NOTE — Telephone Encounter (Signed)
Waiting on clarification from Dr. Curt Bears on frequency.

## 2021-04-27 NOTE — Telephone Encounter (Signed)
Clarified with Dr. Ileana Ladd, patient should take Toprol XL 50 mg one a day. Patient called and made aware. Sent to pharmacy. Questions answered and advised to call with further questions or concerns.

## 2021-04-28 ENCOUNTER — Telehealth: Payer: Self-pay | Admitting: Emergency Medicine

## 2021-04-28 NOTE — Telephone Encounter (Signed)
error 

## 2021-05-07 NOTE — Progress Notes (Signed)
Remote pacemaker transmission.   

## 2021-05-13 ENCOUNTER — Other Ambulatory Visit: Payer: Self-pay

## 2021-05-13 ENCOUNTER — Ambulatory Visit (HOSPITAL_COMMUNITY)
Admission: RE | Admit: 2021-05-13 | Discharge: 2021-05-13 | Disposition: A | Discharge status: Home / Self Care | Payer: PPO | Source: Ambulatory Visit | Attending: Cardiology | Admitting: Cardiology

## 2021-05-13 DIAGNOSIS — Z952 Presence of prosthetic heart valve: Secondary | ICD-10-CM | POA: Insufficient documentation

## 2021-05-13 LAB — ECHOCARDIOGRAM COMPLETE
AR max vel: 1.67 cm2
AV Area VTI: 1.75 cm2
AV Area mean vel: 1.65 cm2
AV Mean grad: 13 mmHg
AV Peak grad: 21.3 mmHg
Ao pk vel: 2.31 m/s
Area-P 1/2: 2.99 cm2
P 1/2 time: 531 msec
S' Lateral: 2.8 cm

## 2021-05-13 NOTE — Progress Notes (Signed)
*  PRELIMINARY RESULTS* Echocardiogram 2D Echocardiogram has been performed.  Corey Aguilar 05/13/2021, 12:47 PM

## 2021-05-21 ENCOUNTER — Ambulatory Visit (INDEPENDENT_AMBULATORY_CARE_PROVIDER_SITE_OTHER): Payer: PPO | Admitting: Dermatology

## 2021-05-21 ENCOUNTER — Other Ambulatory Visit: Payer: Self-pay

## 2021-05-21 DIAGNOSIS — C44319 Basal cell carcinoma of skin of other parts of face: Secondary | ICD-10-CM

## 2021-05-21 NOTE — Patient Instructions (Signed)

## 2021-05-22 ENCOUNTER — Encounter: Payer: Self-pay | Admitting: Cardiology

## 2021-05-22 ENCOUNTER — Encounter: Payer: Self-pay | Admitting: *Deleted

## 2021-05-22 ENCOUNTER — Ambulatory Visit: Payer: PPO | Admitting: Cardiology

## 2021-05-22 VITALS — BP 156/81 | HR 60 | Ht 66.0 in | Wt 183.6 lb

## 2021-05-22 DIAGNOSIS — Z952 Presence of prosthetic heart valve: Secondary | ICD-10-CM | POA: Diagnosis not present

## 2021-05-22 DIAGNOSIS — I2510 Atherosclerotic heart disease of native coronary artery without angina pectoris: Secondary | ICD-10-CM

## 2021-05-22 NOTE — Progress Notes (Signed)
Cardiology Office Note  Date: 05/22/2021   ID: Corey Aguilar, DOB December 20, 1933, MRN [id number]  PCP:  Monico Blitz, MD  Cardiologist:  Rozann Lesches, MD Electrophysiologist:  Constance Haw, MD   Chief Complaint  Patient presents with   Cardiac follow-up    History of Present Illness: Corey Aguilar is an 87 y.o. male last seen in January.  He is here for a routine visit.  Reports no major change in status, NYHA class II dyspnea with basic activities.  He does not report any angina symptoms, no palpitations or syncope.  He has a St. Jude pacemaker in place with history of complete heart block following TAVR in July 2021.  He follows with Dr. Curt Bears.  Device check in June revealed normal function.  Recent follow-up echocardiogram in July shows LVEF 50 to 73%, mild diastolic dysfunction, normal RV contraction, device wire in the right ventricle, and stable TAVR prosthesis with trivial perivalvular leak.  I reviewed his medications.  Blood pressure checked at home this morning was 128/66.  He had a recent visit with Dr. Manuella Ghazi, we are requesting his lab work.  Past Medical History:  Diagnosis Date   Aortic stenosis    Arthritis    BCC (basal cell carcinoma of skin) 12/11/2014   V of neck (CX35FU)   BCC (basal cell carcinoma of skin) 04/07/1989   Left cheek   CAD (coronary artery disease)    Moderate, multivessel disease by cardiac catheterization May 2021   History of kidney stones    Left hip pain    Lumbago    S/P placement of cardiac pacemaker 04/16/2020   Complete heart block, St. Jude device   S/P TAVR (transcatheter aortic valve replacement) 04/15/2020   s/p TAVR with a 26 mm Edwards S3U via the TF approach by Dr. Angelena Form and Roxy Manns   SCC (squamous cell carcinoma) 07/20/2011   Left forearm (CX35FU)   SCC (squamous cell carcinoma) 10/05/2011   Left ear (CX35FU)   SCC (squamous cell carcinoma) 08/22/2018   Left jawline (CX35FU)   Superficial basal cell  carcinoma (BCC) 07/20/2011   Tip of nose (CX35FU)    Past Surgical History:  Procedure Laterality Date   BACK SURGERY  2015   CARDIAC CATHETERIZATION  03/27/2020   EYE SURGERY     cataract sx in both eyes   JOINT REPLACEMENT Left    hip   PACEMAKER IMPLANT N/A 04/16/2020   Procedure: PACEMAKER IMPLANT;  Surgeon: Constance Haw, MD;  Location: [facility];  Service: Cardiovascular;  Laterality: N/A;   RIGHT/LEFT HEART CATH AND CORONARY ANGIOGRAPHY N/A 03/27/2020   Procedure: RIGHT/LEFT HEART CATH AND CORONARY ANGIOGRAPHY;  Surgeon: Burnell Blanks, MD;  Location: [facility];  Service: Cardiovascular;  Laterality: N/A;   TEE WITHOUT CARDIOVERSION N/A 04/15/2020   Procedure: TRANSESOPHAGEAL ECHOCARDIOGRAM (TEE);  Surgeon: Burnell Blanks, MD;  Location: [facility];  Service: Open Heart Surgery;  Laterality: N/A;   TOTAL HIP ARTHROPLASTY Left 05/29/2019   Procedure: TOTAL HIP ARTHROPLASTY ANTERIOR APPROACH;  Surgeon: Paralee Cancel, MD;  Location: [facility];  Service: Orthopedics;  Laterality: Left;  70 mins   TRANSCATHETER AORTIC VALVE REPLACEMENT, TRANSFEMORAL N/A 04/15/2020   Procedure: TRANSCATHETER AORTIC VALVE REPLACEMENT, TRANSFEMORAL;  Surgeon: Burnell Blanks, MD;  Location: [facility];  Service: Open Heart Surgery;  Laterality: N/A;    Current Outpatient Medications  Medication Sig Dispense Refill   metoprolol succinate (TOPROL-XL) 50 MG  24 hr tablet Take 50 mg by mouth daily. Take with or immediately following a meal.     rosuvastatin (CRESTOR) 5 MG tablet Take 1 tablet (5 mg total) by mouth daily. 90 tablet 3   No current facility-administered medications for this visit.   Allergies:  Penicillins and Ibuprofen   ROS: No syncope.  Physical Exam: VS:  BP (!) 156/81   Pulse 60   Ht 5\' 6"  (1.676 m)   Wt 183 lb 9.6 oz (83.3 kg)   SpO2 95%   BMI 29.63 kg/m , BMI Body mass index is 29.63 kg/m.  Wt Readings from Last 3  Encounters:  05/22/21 183 lb 9.6 oz (83.3 kg)  11/10/20 184 lb (83.5 kg)  07/22/20 178 lb (80.7 kg)    General: Elderly male, appears comfortable at rest. HEENT: Conjunctiva and lids normal, wearing a mask. Neck: Supple, no elevated JVP or carotid bruits, no thyromegaly. Lungs: Clear to auscultation, nonlabored breathing at rest. Cardiac: Regular rate and rhythm, no S3, 1/6 systolic murmur, no pericardial rub. Extremities: No pitting edema.  ECG:  An ECG dated 07/22/2020 was personally reviewed today and demonstrated:  Sinus rhythm with left bundle branch block.  Recent Labwork:  No interval lab work for review today.  Other Studies Reviewed Today:  Cardiac catheterization 03/27/2020: 1st Mrg lesion is 50% stenosed. Ost Cx to Prox Cx lesion is 20% stenosed. Prox LAD lesion is 30% stenosed. 1st Diag lesion is 60% stenosed. 2nd Diag lesion is 60% stenosed. Mid LAD lesion is 60% stenosed. Prox RCA lesion is 70% stenosed.   1. Moderate non-obstructive disease in the mid LAD, second Diagonal branch and first obtuse marginal branch. 2. Moderately severe stenosis in the small to moderate caliber, non-dominant RCA 3. Severe aortic stenosis by echo. I did not cross the aortic valve today.    Echocardiogram 05/13/2021:  1. Abnormal septal motion apical hypokinesis no residual VSD. Left  ventricular ejection fraction, by estimation, is 50 to 55%. The left  ventricle has low normal function. The left ventricle has no regional wall  motion abnormalities. Left ventricular  diastolic parameters are consistent with Grade I diastolic dysfunction  (impaired relaxation).   2. Right ventricular systolic function is normal. The right ventricular  size is normal.   3. Left atrial size was mildly dilated.   4. Device lead.   5. The mitral valve is degenerative. Trivial mitral valve regurgitation.  No evidence of mitral stenosis. Moderate mitral annular calcification.   6. Post TAVR with 26 mm  Sapien 3 valve trivial PVL and stable gradients  since echo done 11/17/20 . The aortic valve has been repaired/replaced.  Aortic valve regurgitation is not visualized. No aortic stenosis is  present.   7. The inferior vena cava is normal in size with greater than 50%  respiratory variability, suggesting right atrial pressure of 3 mmHg.   Assessment and Plan:  1.  Severe aortic stenosis status post TAVR in June 2021.  Recent echocardiogram shows stable prosthetic function with trivial paravalvular regurgitation and stable gradients.  He is no longer on antiplatelet therapy.  2.  Moderate CAD not requiring revascularization and without active angina symptoms.  He is on Crestor 5 mg daily.  3.  Complete heart block status post St. Jude pacemaker was followed by Dr. Curt Bears.  He was started on Toprol-XL for interval VT per Dr. Curt Bears.  Medication Adjustments/Labs and Tests Ordered: Current medicines are reviewed at length with the patient today.  Concerns regarding medicines  are outlined above.   Tests Ordered: No orders of the defined types were placed in this encounter.   Medication Changes: No orders of the defined types were placed in this encounter.   Disposition:  Follow up  6 months.  Signed, Satira Sark, MD, [facility] 05/22/2021 12:14 PM    [facility] [address], [place], [place] [address] Phone: [phone]; Fax: [phone]

## 2021-05-22 NOTE — Patient Instructions (Addendum)
Medication Instructions:  Your physician recommends that you continue on your current medications as directed. Please refer to the Current Medication list given to you today. Please call office to verify you have metoprolol succinate 50 mg at home to restart  Labwork: none  Testing/Procedures: none  Follow-Up: Your physician recommends that you schedule a follow-up appointment in: 6 months  Any Other Special Instructions Will Be Listed Below (If Applicable).  If you need a refill on your cardiac medications before your next appointment, please call your pharmacy.

## 2021-06-01 ENCOUNTER — Encounter: Payer: Self-pay | Admitting: Dermatology

## 2021-06-01 NOTE — Progress Notes (Signed)
   Follow-Up Visit   Subjective  Corey Aguilar is a 87 y.o. male who presents for the following: Procedure (Here for treatment on BCC on patients chin. ).  Biopsy-proven BCC chin Location:  Duration:  Quality:  Associated Signs/Symptoms: Modifying Factors:  Severity:  Timing: Context:   Objective  Well appearing patient in no apparent distress; mood and affect are within normal limits. Mid Chin Lesion identified by patient and Dr.Kashawn Manzano in room.    A focused examination was performed including head and neck. Relevant physical exam findings are noted in the Assessment and Plan.   Assessment & Plan    Basal cell carcinoma (BCC) of skin of other part of face Mid Chin  Destruction of lesion Complexity: simple   Destruction method: electrodesiccation and curettage   Informed consent: discussed and consent obtained   Timeout:  patient name, date of birth, surgical site, and procedure verified Anesthesia: the lesion was anesthetized in a standard fashion   Anesthetic:  1% lidocaine w/ epinephrine 1-100,000 local infiltration Curettage performed in three different directions: Yes   Curettage cycles:  3 Lesion length (cm):  1.6 Lesion width (cm):  1.6 Margin per side (cm):  0 Final wound size (cm):  1.6 Hemostasis achieved with:  ferric subsulfate Outcome: patient tolerated procedure well with no complications   Additional details:  Wound innoculated with 5 fluorouracil solution.  Although microscopically this is reported to be a superficial BCC, clinically it appears to be somewhat endophytic.  The patient understands that if there is recurrence he has the option of Mohs surgery.      I, Lavonna Monarch, MD, have reviewed all documentation for this visit.  The documentation on 06/01/21 for the exam, diagnosis, procedures, and orders are all accurate and complete.

## 2021-06-10 ENCOUNTER — Other Ambulatory Visit: Payer: Self-pay | Admitting: Physician Assistant

## 2021-06-14 NOTE — Progress Notes (Signed)
   Follow-Up Visit   Subjective  Corey Aguilar is a 87 y.o. male who presents for the following: Procedure (Here for treatment- left postauricular sulcus- nod & infil bcc).  Biopsy-proven BCC back of left ear plus new crust on rim of ear Location:  Duration:  Quality:  Associated Signs/Symptoms: Modifying Factors:  Severity:  Timing: Context:   Objective  Well appearing patient in no apparent distress; mood and affect are within normal limits. Left Postauricular Sulcus Lesion identified by Dr.Tashona Calk and nurse in room.    Left Mid Helix 4.5 cm to upper ear junction.  After shave biopsy a lesion will be treated.       A focused examination was performed including head and neck.. Relevant physical exam findings are noted in the Assessment and Plan.   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  Destruction of lesion Complexity: simple   Destruction method: electrodesiccation and curettage   Informed consent: discussed and consent obtained   Timeout:  patient name, date of birth, surgical site, and procedure verified Anesthesia: the lesion was anesthetized in a standard fashion   Anesthetic:  1% lidocaine w/ epinephrine 1-100,000 local infiltration Curettage performed in three different directions: Yes   Curettage cycles:  3 Lesion length (cm):  2.1 Lesion width (cm):  2.1 Margin per side (cm):  0 Final wound size (cm):  2.1 Hemostasis achieved with:  ferric subsulfate Outcome: patient tolerated procedure well with no complications   Additional details:  Wound innoculated with 5 fluorouracil solution.  Carcinoma in situ of skin of left ear Left Mid Helix  Skin / nail biopsy Type of biopsy: tangential   Informed consent: discussed and consent obtained   Timeout: patient name, date of birth, surgical site, and procedure verified   Anesthesia: the lesion was anesthetized in a standard fashion   Anesthetic:  1% lidocaine w/  epinephrine 1-100,000 local infiltration Instrument used: flexible razor blade   Hemostasis achieved with: aluminum chloride and electrodesiccation   Outcome: patient tolerated procedure well   Post-procedure details: wound care instructions given    Destruction of lesion Complexity: simple   Destruction method: electrodesiccation and curettage   Informed consent: discussed and consent obtained   Timeout:  patient name, date of birth, surgical site, and procedure verified Anesthesia: the lesion was anesthetized in a standard fashion   Anesthetic:  1% lidocaine w/ epinephrine 1-100,000 local infiltration Curettage performed in three different directions: Yes   Curettage cycles:  3 Lesion length (cm):  0.5 Lesion width (cm):  0.5 Margin per side (cm):  0 Final wound size (cm):  0.5 Hemostasis achieved with:  ferric subsulfate Outcome: patient tolerated procedure well with no complications   Additional details:  Wound innoculated with 5 fluorouracil solution.  Specimen 1 - Surgical pathology Differential Diagnosis: bcc scc tx with bx curet cautery  Check Margins: No  After shave biopsy the base was treated with curettage plus fluorouracil.      I, Lavonna Monarch, MD, have reviewed all documentation for this visit.  The documentation on 06/14/21 for the exam, diagnosis, procedures, and orders are all accurate and complete.

## 2021-07-15 ENCOUNTER — Ambulatory Visit (INDEPENDENT_AMBULATORY_CARE_PROVIDER_SITE_OTHER): Payer: PPO

## 2021-07-15 DIAGNOSIS — G5602 Carpal tunnel syndrome, left upper limb: Secondary | ICD-10-CM | POA: Diagnosis not present

## 2021-07-15 DIAGNOSIS — M13849 Other specified arthritis, unspecified hand: Secondary | ICD-10-CM | POA: Diagnosis not present

## 2021-07-15 DIAGNOSIS — I442 Atrioventricular block, complete: Secondary | ICD-10-CM | POA: Diagnosis not present

## 2021-07-15 DIAGNOSIS — G5603 Carpal tunnel syndrome, bilateral upper limbs: Secondary | ICD-10-CM | POA: Diagnosis not present

## 2021-07-15 DIAGNOSIS — R200 Anesthesia of skin: Secondary | ICD-10-CM | POA: Diagnosis not present

## 2021-07-15 DIAGNOSIS — G5601 Carpal tunnel syndrome, right upper limb: Secondary | ICD-10-CM | POA: Diagnosis not present

## 2021-07-15 LAB — CUP PACEART REMOTE DEVICE CHECK
Battery Remaining Longevity: 108 mo
Battery Remaining Percentage: 91 %
Battery Voltage: 3.01 V
Brady Statistic AP VP Percent: 12 %
Brady Statistic AP VS Percent: 1 %
Brady Statistic AS VP Percent: 87 %
Brady Statistic AS VS Percent: 1 %
Brady Statistic RA Percent Paced: 11 %
Brady Statistic RV Percent Paced: 98 %
Date Time Interrogation Session: 20220907020018
Implantable Lead Implant Date: 20210609
Implantable Lead Implant Date: 20210609
Implantable Lead Location: 753859
Implantable Lead Location: 753860
Implantable Pulse Generator Implant Date: 20210609
Lead Channel Impedance Value: 450 Ohm
Lead Channel Impedance Value: 480 Ohm
Lead Channel Pacing Threshold Amplitude: 0.5 V
Lead Channel Pacing Threshold Amplitude: 0.875 V
Lead Channel Pacing Threshold Pulse Width: 0.4 ms
Lead Channel Pacing Threshold Pulse Width: 0.5 ms
Lead Channel Sensing Intrinsic Amplitude: 10.5 mV
Lead Channel Sensing Intrinsic Amplitude: 5 mV
Lead Channel Setting Pacing Amplitude: 1.125
Lead Channel Setting Pacing Amplitude: 2 V
Lead Channel Setting Pacing Pulse Width: 0.5 ms
Lead Channel Setting Sensing Sensitivity: 2 mV
Pulse Gen Model: 2272
Pulse Gen Serial Number: 3829143

## 2021-07-20 IMAGING — DX PORTABLE PELVIS 1-2 VIEWS
1 series · 1 of 1 positions shown · non-contrast
Comparison: Radiographs dated 08/31/2017

CLINICAL DATA: Osteoarthritis of the left hip. Status post total
hip arthroplasty.

EXAM:
PORTABLE PELVIS 1-2 VIEWS

[pelvis ap]
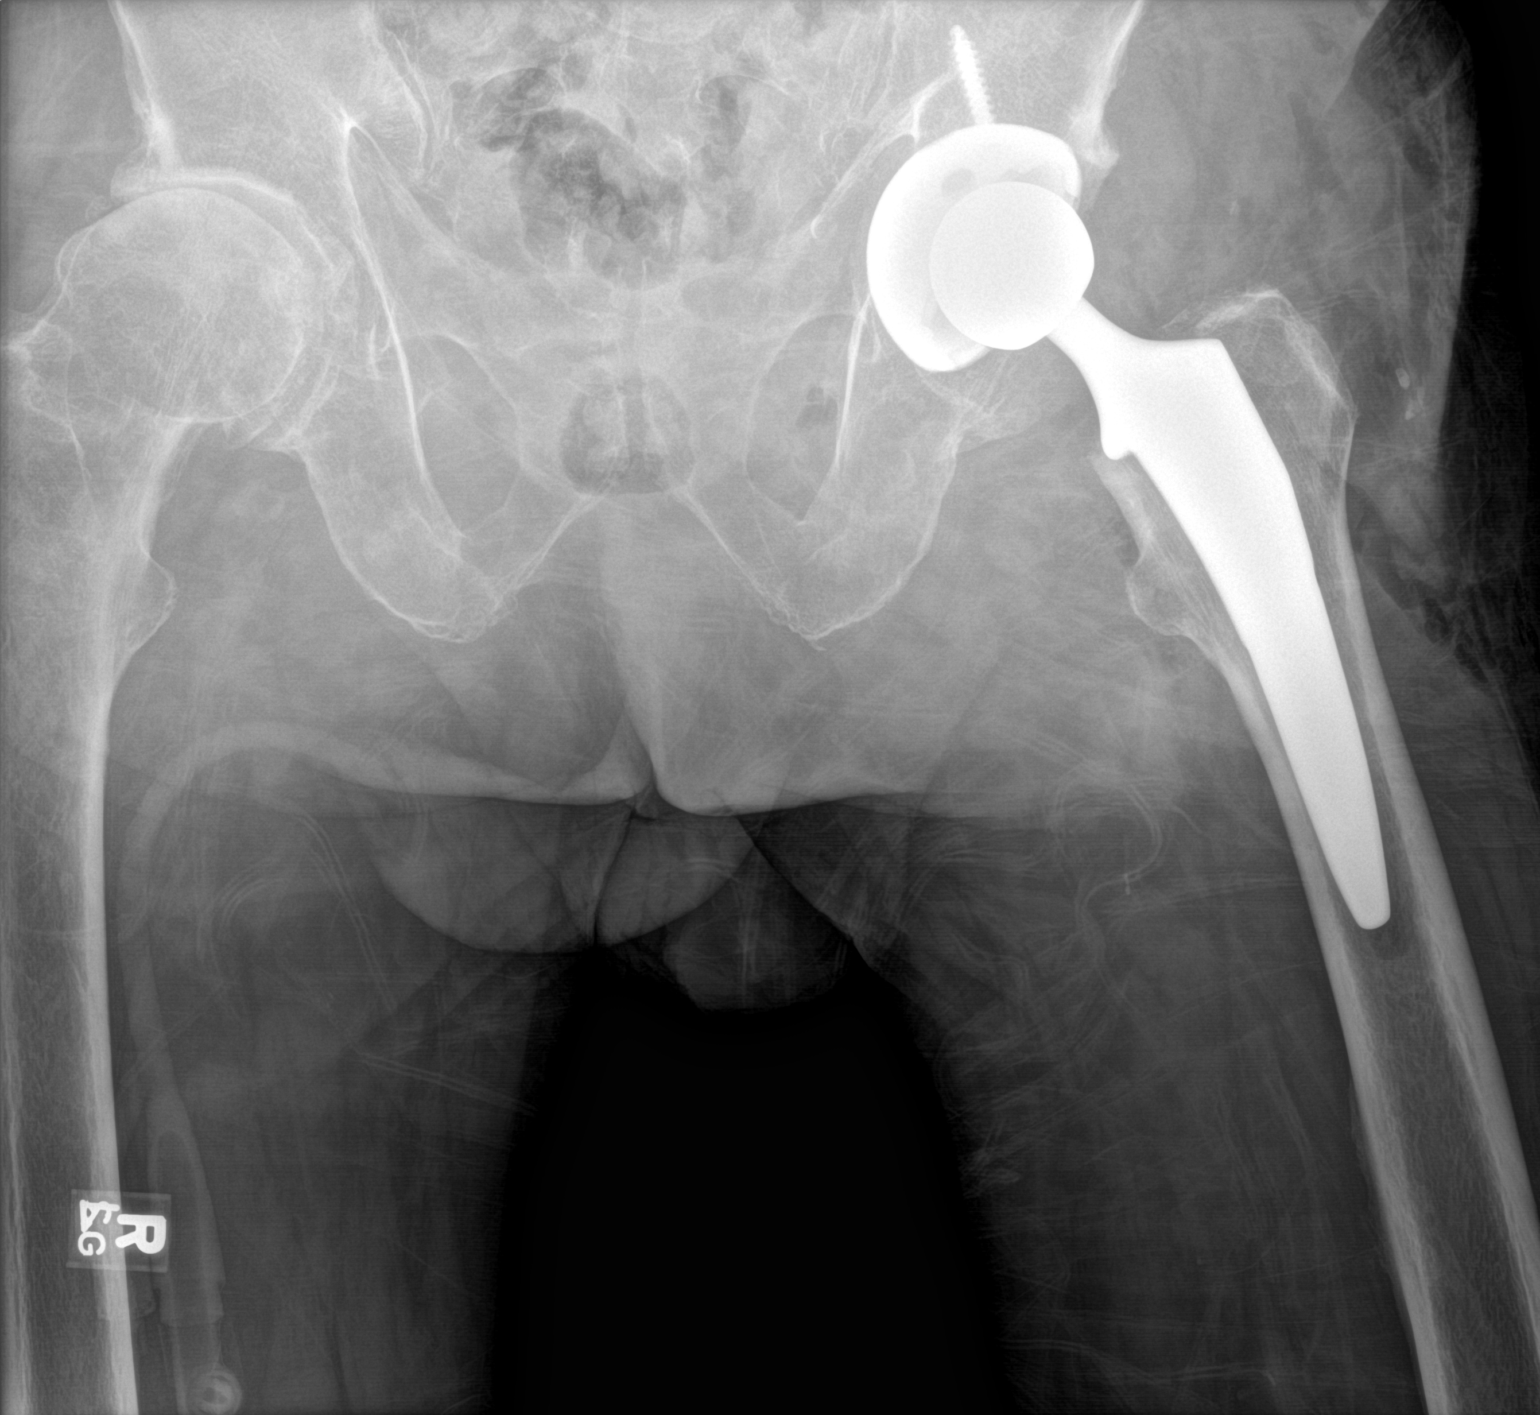

[1 of 1 positions shown; findings below may reference images not displayed]

FINDINGS: The components of the left total hip prosthesis appear in excellent
position in the AP projection. No fractures.

Moderately severe arthritis of the right hip is noted, unchanged.
IMPRESSION: Satisfactory appearance of the left hip in the AP projection after
total hip prosthesis insertion.

## 2021-07-20 IMAGING — RF OPERATIVE LEFT HIP WITH PELVIS
1 series · 2 of 2 positions shown · non-contrast
Comparison: 08/31/2017

FLUOROSCOPY TIME:  0 minutes 20.7 seconds

Dose: 2.62 mGy

CLINICAL DATA: LEFT hip replacement

EXAM:
OPERATIVE LEFT HIP (WITH PELVIS IF PERFORMED) 2 VIEWS
TECHNIQUE: Fluoroscopic spot image(s) were submitted for interpretation
post-operatively.

[Series 1: run · 2 of 2 slices shown]
[im 1/2]
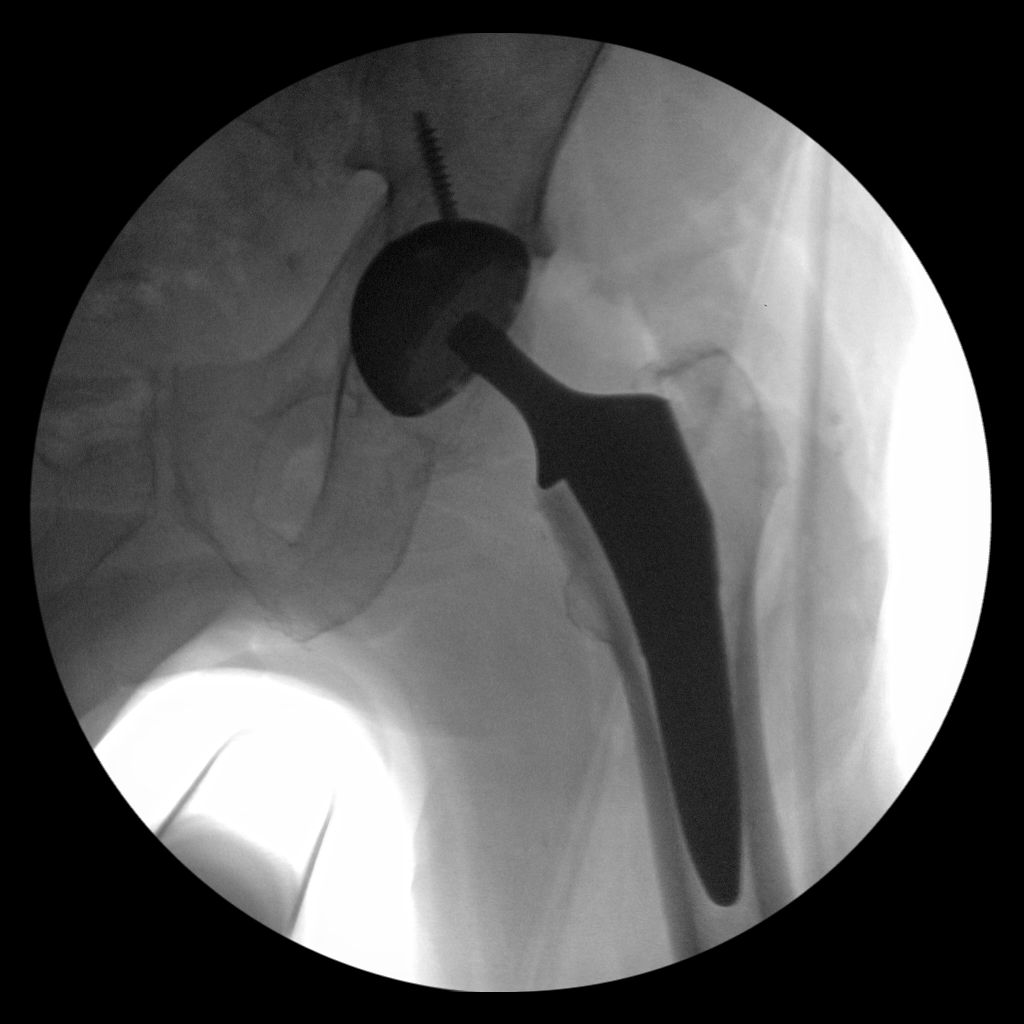
[im 2/2]
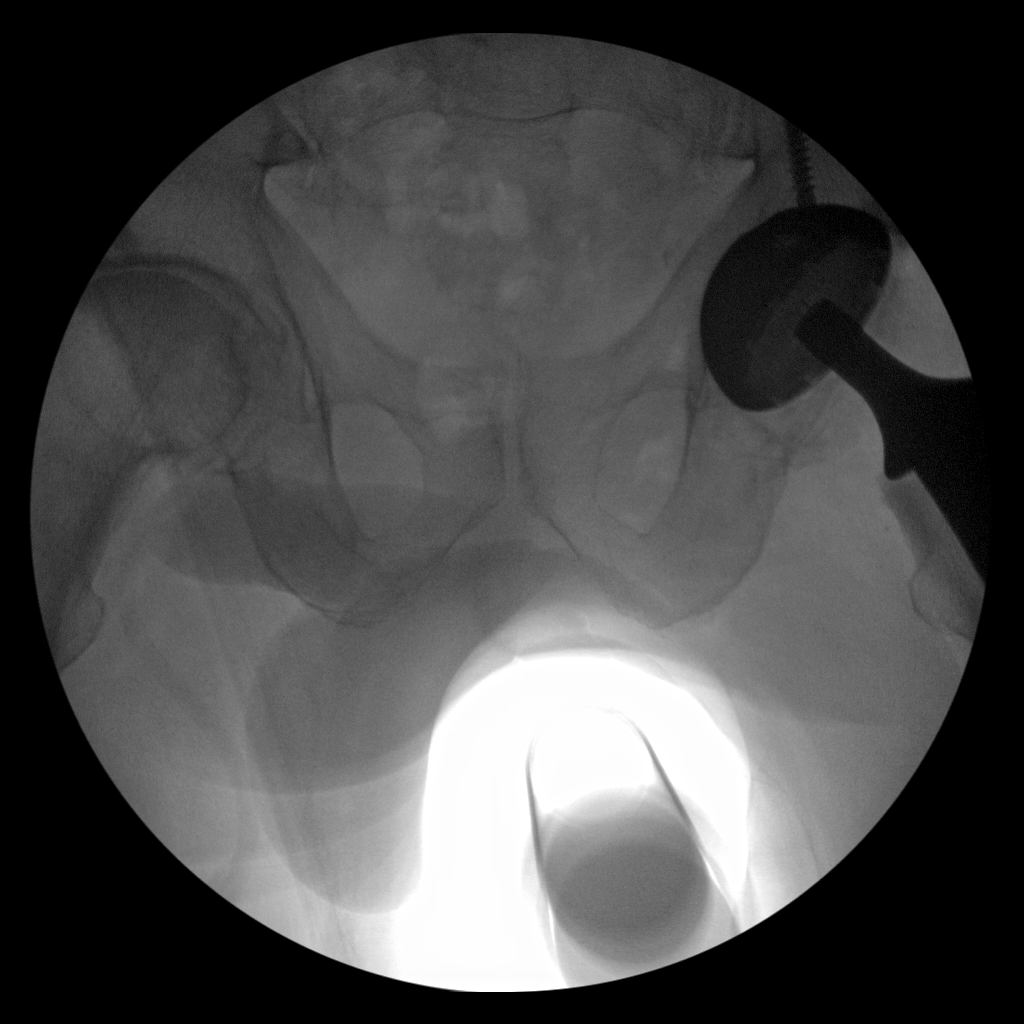

[2 of 2 positions shown; findings below may reference images not displayed]

FINDINGS: Images demonstrate acetabular cup and femoral stem of a LEFT hip
prosthesis.

Femoral head component has not yet been installed.

Bones appear demineralized.

No fracture or bone destruction seen.

Narrowing of the RIGHT hip joint noted.
IMPRESSION: Intraoperative images during LEFT hip arthroplasty.

## 2021-07-23 NOTE — Progress Notes (Signed)
Remote pacemaker transmission.   

## 2021-09-07 ENCOUNTER — Ambulatory Visit: Payer: PPO | Admitting: Dermatology

## 2021-09-07 ENCOUNTER — Other Ambulatory Visit: Payer: Self-pay

## 2021-09-07 ENCOUNTER — Encounter: Payer: Self-pay | Admitting: Dermatology

## 2021-09-07 DIAGNOSIS — D485 Neoplasm of uncertain behavior of skin: Secondary | ICD-10-CM

## 2021-09-07 DIAGNOSIS — L570 Actinic keratosis: Secondary | ICD-10-CM | POA: Diagnosis not present

## 2021-09-07 DIAGNOSIS — C4441 Basal cell carcinoma of skin of scalp and neck: Secondary | ICD-10-CM | POA: Diagnosis not present

## 2021-09-07 NOTE — Patient Instructions (Signed)

## 2021-09-14 ENCOUNTER — Telehealth: Payer: Self-pay

## 2021-09-14 NOTE — Telephone Encounter (Signed)
-----   Message from Lavonna Monarch, MD sent at 09/11/2021  5:33 AM EDT ----- Schedule surgery with Dr. Darene Lamer

## 2021-09-14 NOTE — Telephone Encounter (Signed)
Path to patient and surgery made

## 2021-09-21 ENCOUNTER — Encounter: Payer: Self-pay | Admitting: Dermatology

## 2021-09-21 NOTE — Progress Notes (Signed)
   Follow-Up Visit   Subjective  Corey Aguilar is a 87 y.o. male who presents for the following: Follow-up (Under left eye and he applied Vaseline on it an its some improved ).  New spot under left eye Location:  Duration:  Quality:  Associated Signs/Symptoms: Modifying Factors:  Severity:  Timing: Context:   Objective  Well appearing patient in no apparent distress; mood and affect are within normal limits. Left Malar Cheek Waxy 5 mm crust with focal erosion       Left Anterior Neck Flat pearly crust adjacent to white scar         A focused examination was performed including head and neck. Relevant physical exam findings are noted in the Assessment and Plan.   Assessment & Plan    Neoplasm of uncertain behavior of skin (2) Left Malar Cheek  Skin / nail biopsy Type of biopsy: tangential   Informed consent: discussed and consent obtained   Timeout: patient name, date of birth, surgical site, and procedure verified   Anesthesia: the lesion was anesthetized in a standard fashion   Anesthetic:  1% lidocaine w/ epinephrine 1-100,000 local infiltration Instrument used: flexible razor blade   Hemostasis achieved with: ferric subsulfate   Outcome: patient tolerated procedure well   Post-procedure details: wound care instructions given    Specimen 1 - Surgical pathology Differential Diagnosis: scc vs bcc  Check Margins: No  Left Anterior Neck  Skin / nail biopsy Type of biopsy: tangential   Informed consent: discussed and consent obtained   Timeout: patient name, date of birth, surgical site, and procedure verified   Anesthesia: the lesion was anesthetized in a standard fashion   Anesthetic:  1% lidocaine w/ epinephrine 1-100,000 local infiltration Instrument used: flexible razor blade   Hemostasis achieved with: ferric subsulfate   Outcome: patient tolerated procedure well   Post-procedure details: wound care instructions given    Specimen 2 -  Surgical pathology Differential Diagnosis: scc vs bcc  Check Margins: No      I, Lavonna Monarch, MD, have reviewed all documentation for this visit.  The documentation on 09/21/21 for the exam, diagnosis, procedures, and orders are all accurate and complete.

## 2021-10-14 ENCOUNTER — Ambulatory Visit (INDEPENDENT_AMBULATORY_CARE_PROVIDER_SITE_OTHER): Payer: PPO

## 2021-10-14 DIAGNOSIS — I442 Atrioventricular block, complete: Secondary | ICD-10-CM

## 2021-10-14 LAB — CUP PACEART REMOTE DEVICE CHECK
Battery Remaining Longevity: 103 mo
Battery Remaining Percentage: 88 %
Battery Voltage: 3.01 V
Brady Statistic AP VP Percent: 12 %
Brady Statistic AP VS Percent: 1 %
Brady Statistic AS VP Percent: 86 %
Brady Statistic AS VS Percent: 1 %
Brady Statistic RA Percent Paced: 11 %
Brady Statistic RV Percent Paced: 98 %
Date Time Interrogation Session: 20221207021347
Implantable Lead Implant Date: 20210609
Implantable Lead Implant Date: 20210609
Implantable Lead Location: 753859
Implantable Lead Location: 753860
Implantable Pulse Generator Implant Date: 20210609
Lead Channel Impedance Value: 410 Ohm
Lead Channel Impedance Value: 450 Ohm
Lead Channel Pacing Threshold Amplitude: 0.5 V
Lead Channel Pacing Threshold Amplitude: 1 V
Lead Channel Pacing Threshold Pulse Width: 0.4 ms
Lead Channel Pacing Threshold Pulse Width: 0.5 ms
Lead Channel Sensing Intrinsic Amplitude: 11.7 mV
Lead Channel Sensing Intrinsic Amplitude: 5 mV
Lead Channel Setting Pacing Amplitude: 1.25 V
Lead Channel Setting Pacing Amplitude: 2 V
Lead Channel Setting Pacing Pulse Width: 0.5 ms
Lead Channel Setting Sensing Sensitivity: 2 mV
Pulse Gen Model: 2272
Pulse Gen Serial Number: 3829143

## 2021-10-21 DIAGNOSIS — Z87891 Personal history of nicotine dependence: Secondary | ICD-10-CM | POA: Diagnosis not present

## 2021-10-21 DIAGNOSIS — I10 Essential (primary) hypertension: Secondary | ICD-10-CM | POA: Diagnosis not present

## 2021-10-21 DIAGNOSIS — Z2821 Immunization not carried out because of patient refusal: Secondary | ICD-10-CM | POA: Diagnosis not present

## 2021-10-21 DIAGNOSIS — Z299 Encounter for prophylactic measures, unspecified: Secondary | ICD-10-CM | POA: Diagnosis not present

## 2021-10-21 DIAGNOSIS — R2681 Unsteadiness on feet: Secondary | ICD-10-CM | POA: Diagnosis not present

## 2021-10-21 DIAGNOSIS — M21371 Foot drop, right foot: Secondary | ICD-10-CM | POA: Diagnosis not present

## 2021-10-21 DIAGNOSIS — G4762 Sleep related leg cramps: Secondary | ICD-10-CM | POA: Diagnosis not present

## 2021-10-21 DIAGNOSIS — M1990 Unspecified osteoarthritis, unspecified site: Secondary | ICD-10-CM | POA: Diagnosis not present

## 2021-10-22 NOTE — Progress Notes (Signed)
Remote pacemaker transmission.   

## 2021-10-29 ENCOUNTER — Other Ambulatory Visit: Payer: Self-pay

## 2021-10-29 ENCOUNTER — Ambulatory Visit (INDEPENDENT_AMBULATORY_CARE_PROVIDER_SITE_OTHER): Payer: PPO | Admitting: Dermatology

## 2021-10-29 ENCOUNTER — Encounter: Payer: Self-pay | Admitting: Dermatology

## 2021-10-29 DIAGNOSIS — C4441 Basal cell carcinoma of skin of scalp and neck: Secondary | ICD-10-CM | POA: Diagnosis not present

## 2021-10-29 DIAGNOSIS — L988 Other specified disorders of the skin and subcutaneous tissue: Secondary | ICD-10-CM | POA: Diagnosis not present

## 2021-10-29 NOTE — Patient Instructions (Signed)

## 2021-11-05 ENCOUNTER — Encounter: Payer: PPO | Admitting: Dermatology

## 2021-11-24 ENCOUNTER — Ambulatory Visit: Payer: PPO | Admitting: Cardiology

## 2021-11-24 ENCOUNTER — Encounter: Payer: Self-pay | Admitting: Cardiology

## 2021-11-24 VITALS — BP 146/98 | HR 79 | Ht 66.0 in | Wt 179.0 lb

## 2021-11-24 DIAGNOSIS — I2510 Atherosclerotic heart disease of native coronary artery without angina pectoris: Secondary | ICD-10-CM

## 2021-11-24 DIAGNOSIS — Z952 Presence of prosthetic heart valve: Secondary | ICD-10-CM

## 2021-11-24 DIAGNOSIS — I442 Atrioventricular block, complete: Secondary | ICD-10-CM | POA: Diagnosis not present

## 2021-11-24 NOTE — Patient Instructions (Addendum)

## 2021-11-24 NOTE — Progress Notes (Signed)
Cardiology Office Note  Date: 11/24/2021   ID: Corey Aguilar, DOB 10-Jul-1934, MRN [id number]  PCP:  Monico Blitz, MD  Cardiologist:  Rozann Lesches, MD Electrophysiologist:  Constance Haw, MD   Chief Complaint  Patient presents with   Cardiac follow-up    History of Present Illness: Corey Aguilar is an 88 y.o. male last seen in July 2022.  He is here for a follow-up visit.  Reports no change in stamina, still functional with ADLs, also gathers wood for a wood stove.  He reports NYHA class II dyspnea with most activities.  No palpitations or syncope.  He has a St. Jude pacemaker in place with followed by Dr. Curt Bears.  Device interrogation in December 2022 revealed normal function with less than 1% AT/AF burden.  Echocardiogram from July 2022 revealed LVEF 50 to 55% with mild diastolic dysfunction, stable TAVR prosthesis with trivial paravalvular regurgitation and mean gradient 13 mmHg.  I reviewed his medications which are noted below.  I personally reviewed his ECG which shows a ventricular paced rhythm with atrial tracking.  Past Medical History:  Diagnosis Date   Aortic stenosis    Arthritis    BCC (basal cell carcinoma of skin) 12/11/2014   V of neck (CX35FU)   BCC (basal cell carcinoma of skin) 04/07/1989   Left cheek   CAD (coronary artery disease)    Moderate, multivessel disease by cardiac catheterization May 2021   History of kidney stones    Left hip pain    Lumbago    S/P placement of cardiac pacemaker 04/16/2020   Complete heart block, St. Jude device   S/P TAVR (transcatheter aortic valve replacement) 04/15/2020   s/p TAVR with a 26 mm Edwards S3U via the TF approach by Dr. Angelena Form and Roxy Manns   SCC (squamous cell carcinoma) 07/20/2011   Left forearm (CX35FU)   SCC (squamous cell carcinoma) 10/05/2011   Left ear (CX35FU)   SCC (squamous cell carcinoma) 08/22/2018   Left jawline (CX35FU)   Superficial basal cell carcinoma (BCC) 07/20/2011    Tip of nose (CX35FU)    Past Surgical History:  Procedure Laterality Date   BACK SURGERY  2015   CARDIAC CATHETERIZATION  03/27/2020   EYE SURGERY     cataract sx in both eyes   JOINT REPLACEMENT Left    hip   PACEMAKER IMPLANT N/A 04/16/2020   Procedure: PACEMAKER IMPLANT;  Surgeon: Constance Haw, MD;  Location: [place] [facility];  Service: Cardiovascular;  Laterality: N/A;   RIGHT/LEFT HEART CATH AND CORONARY ANGIOGRAPHY N/A 03/27/2020   Procedure: RIGHT/LEFT HEART CATH AND CORONARY ANGIOGRAPHY;  Surgeon: Burnell Blanks, MD;  Location: [place] [facility];  Service: Cardiovascular;  Laterality: N/A;   TEE WITHOUT CARDIOVERSION N/A 04/15/2020   Procedure: TRANSESOPHAGEAL ECHOCARDIOGRAM (TEE);  Surgeon: Burnell Blanks, MD;  Location: [place] [facility];  Service: Open Heart Surgery;  Laterality: N/A;   TOTAL HIP ARTHROPLASTY Left 05/29/2019   Procedure: TOTAL HIP ARTHROPLASTY ANTERIOR APPROACH;  Surgeon: Paralee Cancel, MD;  Location: [facility];  Service: Orthopedics;  Laterality: Left;  70 mins   TRANSCATHETER AORTIC VALVE REPLACEMENT, TRANSFEMORAL N/A 04/15/2020   Procedure: TRANSCATHETER AORTIC VALVE REPLACEMENT, TRANSFEMORAL;  Surgeon: Burnell Blanks, MD;  Location: [place] [facility];  Service: Open Heart Surgery;  Laterality: N/A;    Current Outpatient Medications  Medication Sig Dispense Refill   acetaminophen (TYLENOL) 650 MG CR tablet Take 650 mg by mouth at bedtime as  needed for pain (arthritis).     gabapentin (NEURONTIN) 100 MG capsule Take 100 mg by mouth at bedtime.     metoprolol succinate (TOPROL-XL) 50 MG 24 hr tablet Take 50 mg by mouth daily. Take with or immediately following a meal.     rosuvastatin (CRESTOR) 5 MG tablet Take 1 tablet (5 mg total) by mouth daily. (Patient not taking: Reported on 11/24/2021) 90 tablet 3   No current facility-administered medications for this visit.   Allergies:  Penicillins and Ibuprofen   ROS: Right foot  drop, chronic arthritic knee pain.  Physical Exam: VS:  BP (!) 146/98    Pulse 79    Ht 5\' 6"  (1.676 m)    Wt 179 lb (81.2 kg)    SpO2 94%    BMI 28.89 kg/m , BMI Body mass index is 28.89 kg/m.  Wt Readings from Last 3 Encounters:  11/24/21 179 lb (81.2 kg)  05/22/21 183 lb 9.6 oz (83.3 kg)  11/10/20 184 lb (83.5 kg)    General: Patient appears comfortable at rest. HEENT: Conjunctiva and lids normal, wearing a mask. Neck: Supple, no elevated JVP or carotid bruits, no thyromegaly. Lungs: Clear to auscultation, nonlabored breathing at rest. Cardiac: Regular rate and rhythm, no S3, 1/6 systolic murmur, no pericardial rub. Extremities: No pitting edema.  ECG:  An ECG dated 07/22/2020 was personally reviewed today and demonstrated:  SInus rhythm with left bundle branch block.  Recent Labwork:  June 2022: Hemoglobin 13.6, platelets 244, BUN 19, creatinine 0.85, potassium 4.8, AST 16, ALT 8, cholesterol 149, triglycerides 68, HDL 40, DL 95  Other Studies Reviewed Today:  Echocardiogram 05/13/2021:  1. Abnormal septal motion apical hypokinesis no residual VSD. Left  ventricular ejection fraction, by estimation, is 50 to 55%. The left  ventricle has low normal function. The left ventricle has no regional wall  motion abnormalities. Left ventricular  diastolic parameters are consistent with Grade I diastolic dysfunction  (impaired relaxation).   2. Right ventricular systolic function is normal. The right ventricular  size is normal.   3. Left atrial size was mildly dilated.   4. Device lead.   5. The mitral valve is degenerative. Trivial mitral valve regurgitation.  No evidence of mitral stenosis. Moderate mitral annular calcification.   6. Post TAVR with 26 mm Sapien 3 valve trivial PVL and stable gradients  since echo done 11/17/20 . The aortic valve has been repaired/replaced.  Aortic valve regurgitation is not visualized. No aortic stenosis is  present.   7. The inferior vena cava  is normal in size with greater than 50%  respiratory variability, suggesting right atrial pressure of 3 mmHg.   Assessment and Plan:  1.  History of severe aortic stenosis status post TAVR in June 2021.  He is symptomatically stable with NYHA class II dyspnea with typical activities.  We will plan a follow-up echocardiogram for visit in 6 months.  He does have a trivial paravalvular leak with mean gradient 13 mmHg by last assessment.  2.  History of complete heart block status post [facility] pacemaker with continued follow-up by Dr. Curt Bears.  No syncope.  3.  Moderate nonobstructive CAD.  He remains on Crestor.  Medication Adjustments/Labs and Tests Ordered: Current medicines are reviewed at length with the patient today.  Concerns regarding medicines are outlined above.   Tests Ordered: Orders Placed This Encounter  Procedures   ECHOCARDIOGRAM COMPLETE    Medication Changes: No orders of the defined types were placed in  this encounter.   Disposition:  Follow up  6 months.  Signed, Satira Sark, MD, [facility] 11/24/2021 11:47 AM    [facility] [place], [place], [place] [address] Phone: [phone]; Fax: [phone]

## 2021-11-24 NOTE — Addendum Note (Signed)
Addended by: Laurine Blazer on: 11/24/2021 01:04 PM   Modules accepted: Orders

## 2021-11-25 ENCOUNTER — Ambulatory Visit: Payer: PPO | Admitting: Dermatology

## 2021-11-28 ENCOUNTER — Encounter: Payer: Self-pay | Admitting: Dermatology

## 2021-11-28 NOTE — Progress Notes (Signed)
° °  Follow-Up Visit   Subjective  Corey Aguilar is a 88 y.o. male who presents for the following: Procedure (Bcc left anterior neck).  BCC left neck Location:  Duration:  Quality:  Associated Signs/Symptoms: Modifying Factors:  Severity:  Timing: Context:   Objective  Well appearing patient in no apparent distress; mood and affect are within normal limits. Left Anterior Neck Lesion identified by patient, Dr.Micaella Gitto and nurse in room.      A focused examination was performed including the neck and head. Relevant physical exam findings are noted in the Assessment and Plan.   Assessment & Plan    Basal cell carcinoma (BCC) of left side of neck Left Anterior Neck  Destruction of lesion Complexity: simple   Destruction method: electrodesiccation and curettage   Informed consent: discussed and consent obtained   Timeout:  patient name, date of birth, surgical site, and procedure verified Anesthesia: the lesion was anesthetized in a standard fashion   Anesthetic:  1% lidocaine w/ epinephrine 1-100,000 local infiltration Curettage performed in three different directions: Yes   Electrodesiccation performed over the curetted area: Yes   Curettage cycles:  3 Lesion length (cm):  1.5 Lesion width (cm):  1.5 Margin per side (cm):  0.1 Final wound size (cm):  1.7 Hemostasis achieved with:  ferric subsulfate and electrodesiccation Outcome: patient tolerated procedure well with no complications    Skin excision  Lesion length (cm):  1.7 Lesion width (cm):  1.7 Margin per side (cm):  0.1 Total excision diameter (cm):  1.9 Informed consent: discussed and consent obtained   Timeout: patient name, date of birth, surgical site, and procedure verified   Anesthesia: the lesion was anesthetized in a standard fashion   Anesthetic:  1% lidocaine w/ epinephrine 1-100,000 local infiltration Instrument used: #15 blade   Hemostasis achieved with: pressure and electrodesiccation    Outcome: patient tolerated procedure well with no complications   Post-procedure details: sterile dressing applied and wound care instructions given   Dressing type: bandage, petrolatum and pressure dressing    Specimen 1 - Surgical pathology Differential Diagnosis: R/O [facility]  [id number] Check Margins: Yes - anterior margin stained  Curettage showed extension into the deep dermis so after repeat curettage and cautery narrow margin excision with simple closure was done.      I, Lavonna Monarch, MD, have reviewed all documentation for this visit.  The documentation on 11/28/21 for the exam, diagnosis, procedures, and orders are all accurate and complete.

## 2022-01-13 ENCOUNTER — Ambulatory Visit (INDEPENDENT_AMBULATORY_CARE_PROVIDER_SITE_OTHER): Payer: PPO

## 2022-01-13 DIAGNOSIS — I442 Atrioventricular block, complete: Secondary | ICD-10-CM

## 2022-01-13 LAB — CUP PACEART REMOTE DEVICE CHECK
Battery Remaining Longevity: 98 mo
Battery Remaining Percentage: 86 %
Battery Voltage: 3.01 V
Brady Statistic AP VP Percent: 11 %
Brady Statistic AP VS Percent: 1 %
Brady Statistic AS VP Percent: 86 %
Brady Statistic AS VS Percent: 1.1 %
Brady Statistic RA Percent Paced: 10 %
Brady Statistic RV Percent Paced: 98 %
Date Time Interrogation Session: 20230308020013
Implantable Lead Implant Date: 20210609
Implantable Lead Implant Date: 20210609
Implantable Lead Location: 753859
Implantable Lead Location: 753860
Implantable Pulse Generator Implant Date: 20210609
Lead Channel Impedance Value: 390 Ohm
Lead Channel Impedance Value: 400 Ohm
Lead Channel Pacing Threshold Amplitude: 0.5 V
Lead Channel Pacing Threshold Amplitude: 1 V
Lead Channel Pacing Threshold Pulse Width: 0.4 ms
Lead Channel Pacing Threshold Pulse Width: 0.5 ms
Lead Channel Sensing Intrinsic Amplitude: 5 mV
Lead Channel Sensing Intrinsic Amplitude: 9.4 mV
Lead Channel Setting Pacing Amplitude: 1.25 V
Lead Channel Setting Pacing Amplitude: 2 V
Lead Channel Setting Pacing Pulse Width: 0.5 ms
Lead Channel Setting Sensing Sensitivity: 2 mV
Pulse Gen Model: 2272
Pulse Gen Serial Number: 3829143

## 2022-01-26 NOTE — Progress Notes (Signed)
Remote pacemaker transmission.   

## 2022-03-03 DIAGNOSIS — M13849 Other specified arthritis, unspecified hand: Secondary | ICD-10-CM | POA: Diagnosis not present

## 2022-03-03 DIAGNOSIS — G5603 Carpal tunnel syndrome, bilateral upper limbs: Secondary | ICD-10-CM | POA: Diagnosis not present

## 2022-03-03 DIAGNOSIS — M79642 Pain in left hand: Secondary | ICD-10-CM | POA: Diagnosis not present

## 2022-03-03 DIAGNOSIS — M79641 Pain in right hand: Secondary | ICD-10-CM | POA: Diagnosis not present

## 2022-03-12 DIAGNOSIS — G5602 Carpal tunnel syndrome, left upper limb: Secondary | ICD-10-CM | POA: Diagnosis not present

## 2022-03-26 DIAGNOSIS — M79642 Pain in left hand: Secondary | ICD-10-CM | POA: Diagnosis not present

## 2022-03-26 DIAGNOSIS — H1031 Unspecified acute conjunctivitis, right eye: Secondary | ICD-10-CM | POA: Diagnosis not present

## 2022-04-14 ENCOUNTER — Other Ambulatory Visit: Payer: Self-pay | Admitting: Cardiology

## 2022-04-14 ENCOUNTER — Ambulatory Visit (INDEPENDENT_AMBULATORY_CARE_PROVIDER_SITE_OTHER): Payer: PPO

## 2022-04-14 DIAGNOSIS — I442 Atrioventricular block, complete: Secondary | ICD-10-CM

## 2022-04-14 DIAGNOSIS — I359 Nonrheumatic aortic valve disorder, unspecified: Secondary | ICD-10-CM

## 2022-04-14 DIAGNOSIS — Z952 Presence of prosthetic heart valve: Secondary | ICD-10-CM

## 2022-04-15 LAB — CUP PACEART REMOTE DEVICE CHECK
Battery Remaining Longevity: 100 mo
Battery Remaining Percentage: 83 %
Battery Voltage: 3.01 V
Brady Statistic AP VP Percent: 12 %
Brady Statistic AP VS Percent: 1 %
Brady Statistic AS VP Percent: 86 %
Brady Statistic AS VS Percent: 1.3 %
Brady Statistic RA Percent Paced: 10 %
Brady Statistic RV Percent Paced: 97 %
Date Time Interrogation Session: 20230607020015
Implantable Lead Implant Date: 20210609
Implantable Lead Implant Date: 20210609
Implantable Lead Location: 753859
Implantable Lead Location: 753860
Implantable Pulse Generator Implant Date: 20210609
Lead Channel Impedance Value: 410 Ohm
Lead Channel Impedance Value: 440 Ohm
Lead Channel Pacing Threshold Amplitude: 0.5 V
Lead Channel Pacing Threshold Amplitude: 0.75 V
Lead Channel Pacing Threshold Pulse Width: 0.4 ms
Lead Channel Pacing Threshold Pulse Width: 0.5 ms
Lead Channel Sensing Intrinsic Amplitude: 3.6 mV
Lead Channel Sensing Intrinsic Amplitude: 5 mV
Lead Channel Setting Pacing Amplitude: 1 V
Lead Channel Setting Pacing Amplitude: 2 V
Lead Channel Setting Pacing Pulse Width: 0.5 ms
Lead Channel Setting Sensing Sensitivity: 2 mV
Pulse Gen Model: 2272
Pulse Gen Serial Number: 3829143

## 2022-04-22 DIAGNOSIS — E7800 Pure hypercholesterolemia, unspecified: Secondary | ICD-10-CM | POA: Diagnosis not present

## 2022-04-22 DIAGNOSIS — R059 Cough, unspecified: Secondary | ICD-10-CM | POA: Diagnosis not present

## 2022-04-22 DIAGNOSIS — Z6828 Body mass index (BMI) 28.0-28.9, adult: Secondary | ICD-10-CM | POA: Diagnosis not present

## 2022-04-22 DIAGNOSIS — R5383 Other fatigue: Secondary | ICD-10-CM | POA: Diagnosis not present

## 2022-04-22 DIAGNOSIS — Z0000 Encounter for general adult medical examination without abnormal findings: Secondary | ICD-10-CM | POA: Diagnosis not present

## 2022-04-22 DIAGNOSIS — Z299 Encounter for prophylactic measures, unspecified: Secondary | ICD-10-CM | POA: Diagnosis not present

## 2022-04-22 DIAGNOSIS — I10 Essential (primary) hypertension: Secondary | ICD-10-CM | POA: Diagnosis not present

## 2022-04-22 DIAGNOSIS — R051 Acute cough: Secondary | ICD-10-CM | POA: Diagnosis not present

## 2022-04-22 DIAGNOSIS — Z1339 Encounter for screening examination for other mental health and behavioral disorders: Secondary | ICD-10-CM | POA: Diagnosis not present

## 2022-04-22 DIAGNOSIS — Z79899 Other long term (current) drug therapy: Secondary | ICD-10-CM | POA: Diagnosis not present

## 2022-04-22 DIAGNOSIS — Z87891 Personal history of nicotine dependence: Secondary | ICD-10-CM | POA: Diagnosis not present

## 2022-04-22 DIAGNOSIS — Z125 Encounter for screening for malignant neoplasm of prostate: Secondary | ICD-10-CM | POA: Diagnosis not present

## 2022-04-22 DIAGNOSIS — Z1331 Encounter for screening for depression: Secondary | ICD-10-CM | POA: Diagnosis not present

## 2022-04-23 NOTE — Progress Notes (Signed)
Remote pacemaker transmission.   

## 2022-05-20 ENCOUNTER — Ambulatory Visit (INDEPENDENT_AMBULATORY_CARE_PROVIDER_SITE_OTHER): Payer: PPO

## 2022-05-20 DIAGNOSIS — Z952 Presence of prosthetic heart valve: Secondary | ICD-10-CM | POA: Diagnosis not present

## 2022-05-20 DIAGNOSIS — I359 Nonrheumatic aortic valve disorder, unspecified: Secondary | ICD-10-CM | POA: Diagnosis not present

## 2022-05-21 LAB — ECHOCARDIOGRAM COMPLETE
AR max vel: 1.56 cm2
AV Area VTI: 1.54 cm2
AV Area mean vel: 1.46 cm2
AV Mean grad: 14.2 mmHg
AV Peak grad: 25.8 mmHg
Ao pk vel: 2.54 m/s
Area-P 1/2: 1.62 cm2
Calc EF: 60.2 %
MV M vel: 4.3 m/s
MV Peak grad: 74 mmHg
P 1/2 time: 914 msec
S' Lateral: 3.57 cm
Single Plane A2C EF: 59.8 %
Single Plane A4C EF: 61.7 %

## 2022-05-25 ENCOUNTER — Ambulatory Visit: Payer: PPO | Admitting: Cardiology

## 2022-05-25 ENCOUNTER — Encounter: Payer: Self-pay | Admitting: Cardiology

## 2022-05-25 VITALS — BP 116/84 | HR 62 | Ht 66.0 in | Wt 180.0 lb

## 2022-05-25 DIAGNOSIS — I25119 Atherosclerotic heart disease of native coronary artery with unspecified angina pectoris: Secondary | ICD-10-CM

## 2022-05-25 DIAGNOSIS — Z952 Presence of prosthetic heart valve: Secondary | ICD-10-CM | POA: Diagnosis not present

## 2022-05-25 DIAGNOSIS — I442 Atrioventricular block, complete: Secondary | ICD-10-CM

## 2022-05-25 NOTE — Patient Instructions (Signed)

## 2022-05-25 NOTE — Progress Notes (Signed)
Cardiology Office Note  Date: 05/25/2022   ID: Corey Aguilar, DOB 10-18-1934, MRN [id number]  PCP:  Monico Blitz, MD  Cardiologist:  Rozann Lesches, MD Electrophysiologist:  Constance Haw, MD   Chief Complaint  Patient presents with   Cardiac follow-up    History of Present Illness: Corey Aguilar is an 88 y.o. male last seen in January.  He is here today for a follow-up visit.  Reports no major change in status, paces himself with activity but is limited by arthritic pain.  He reports NYHA class II dyspnea with most activities at low level.  No angina symptoms or palpitations.  St. Jude pacemaker in place with followed by Dr. Curt Bears.  Recent device interrogation indicated normal function.  Recent follow-up echocardiogram showed LVEF 60 to 65%, mild mitral stenosis, stable TAVR prosthesis with mean gradient 14 mmHg and mild regurgitation.  I reviewed his medications which are noted below.  Past Medical History:  Diagnosis Date   Aortic stenosis    Arthritis    BCC (basal cell carcinoma of skin) 12/11/2014   V of neck (CX35FU)   BCC (basal cell carcinoma of skin) 04/07/1989   Left cheek   CAD (coronary artery disease)    Moderate, multivessel disease by cardiac catheterization May 2021   History of kidney stones    Left hip pain    Lumbago    S/P placement of cardiac pacemaker 04/16/2020   Complete heart block, St. Jude device   S/P TAVR (transcatheter aortic valve replacement) 04/15/2020   s/p TAVR with a 26 mm Edwards S3U via the TF approach by Dr. Angelena Form and Roxy Manns   SCC (squamous cell carcinoma) 07/20/2011   Left forearm (CX35FU)   SCC (squamous cell carcinoma) 10/05/2011   Left ear (CX35FU)   SCC (squamous cell carcinoma) 08/22/2018   Left jawline (CX35FU)   Superficial basal cell carcinoma (BCC) 07/20/2011   Tip of nose (CX35FU)    Past Surgical History:  Procedure Laterality Date   BACK SURGERY  2015   CARDIAC CATHETERIZATION  03/27/2020    EYE SURGERY     cataract sx in both eyes   JOINT REPLACEMENT Left    hip   PACEMAKER IMPLANT N/A 04/16/2020   Procedure: PACEMAKER IMPLANT;  Surgeon: Constance Haw, MD;  Location: [place] [facility];  Service: Cardiovascular;  Laterality: N/A;   RIGHT/LEFT HEART CATH AND CORONARY ANGIOGRAPHY N/A 03/27/2020   Procedure: RIGHT/LEFT HEART CATH AND CORONARY ANGIOGRAPHY;  Surgeon: Burnell Blanks, MD;  Location: [place] [facility];  Service: Cardiovascular;  Laterality: N/A;   TEE WITHOUT CARDIOVERSION N/A 04/15/2020   Procedure: TRANSESOPHAGEAL ECHOCARDIOGRAM (TEE);  Surgeon: Burnell Blanks, MD;  Location: [place] [facility];  Service: Open Heart Surgery;  Laterality: N/A;   TOTAL HIP ARTHROPLASTY Left 05/29/2019   Procedure: TOTAL HIP ARTHROPLASTY ANTERIOR APPROACH;  Surgeon: Paralee Cancel, MD;  Location: [facility];  Service: Orthopedics;  Laterality: Left;  70 mins   TRANSCATHETER AORTIC VALVE REPLACEMENT, TRANSFEMORAL N/A 04/15/2020   Procedure: TRANSCATHETER AORTIC VALVE REPLACEMENT, TRANSFEMORAL;  Surgeon: Burnell Blanks, MD;  Location: [place] [facility];  Service: Open Heart Surgery;  Laterality: N/A;    Current Outpatient Medications  Medication Sig Dispense Refill   acetaminophen (TYLENOL) 650 MG CR tablet Take 650 mg by mouth at bedtime as needed for pain (arthritis).     aspirin EC 81 MG tablet Take 81 mg by mouth daily. Swallow whole.  gabapentin (NEURONTIN) 100 MG capsule Take 100 mg by mouth at bedtime.     metoprolol succinate (TOPROL-XL) 50 MG 24 hr tablet Take 50 mg by mouth daily. Take with or immediately following a meal.     rosuvastatin (CRESTOR) 5 MG tablet Take 5 mg by mouth daily. Once daily     No current facility-administered medications for this visit.   Allergies:  Penicillins and Ibuprofen   ROS: No orthopnea or PND.  Physical Exam: VS:  BP 116/84 (BP Location: Left Arm, Patient Position: Sitting, Cuff Size: Normal)   Pulse 62   Ht 5'  6" (1.676 m)   Wt 180 lb (81.6 kg)   SpO2 93%   BMI 29.05 kg/m , BMI Body mass index is 29.05 kg/m.  Wt Readings from Last 3 Encounters:  05/25/22 180 lb (81.6 kg)  11/24/21 179 lb (81.2 kg)  05/22/21 183 lb 9.6 oz (83.3 kg)    General: Patient appears comfortable at rest. HEENT: Conjunctiva and lids normal, oropharynx clear. Neck: Supple, no elevated JVP or carotid bruits, no thyromegaly. Lungs: Clear to auscultation, nonlabored breathing at rest. Cardiac: Regular rate and rhythm, no S3, 2/6 systolic murmur. Extremities: No pitting edema.  ECG:  An ECG dated 11/24/2021 was personally reviewed today and demonstrated:  Ventricular paced rhythm with atrial tracking.  Recent Labwork:  June 2022: Hemoglobin 13.6, platelets 244, BUN 19, creatinine 0.85, potassium 4.8, AST 16, ALT 8, cholesterol 149, triglycerides 68, HDL 40, LDL 95  Other Studies Reviewed Today:  Echocardiogram 05/20/2022:  1. Left ventricular ejection fraction, by estimation, is 60 to 65%. The  left ventricle has normal function. The left ventricle has no regional  wall motion abnormalities. Left ventricular diastolic parameters are  consistent with Grade I diastolic  dysfunction (impaired relaxation). Elevated left atrial pressure.   2. Right ventricular systolic function is normal. The right ventricular  size is normal. Tricuspid regurgitation signal is inadequate for assessing  PA pressure.   3. The mitral valve is abnormal. No evidence of mitral valve  regurgitation. Mild mitral stenosis. Moderate mitral annular  calcification.   4. Edwards Sapien 3 Ultra THV size 26 mm is in the AV position.. The  aortic valve has been repaired/replaced. Aortic valve regurgitation is  mild. No aortic stenosis is present.   5. Aortic dilatation noted. There is mild dilatation of the ascending  aorta, measuring 36 mm.   Assessment and Plan:  1.  Severe aortic stenosis status post TAVR in June 2021.  He is clinically stable  with a recent follow-up echocardiogram showing LVEF 60 to 65%, stable TAVR prosthesis with mean gradient approximately 14 mmHg and mild regurgitation.  2.  Complete heart block status post St. Jude pacemaker, recent device interrogation shows normal function.  3.  Nonobstructive CAD, asymptomatic.  Continue aspirin and Crestor.  Medication Adjustments/Labs and Tests Ordered: Current medicines are reviewed at length with the patient today.  Concerns regarding medicines are outlined above.   Tests Ordered: No orders of the defined types were placed in this encounter.   Medication Changes: No orders of the defined types were placed in this encounter.   Disposition:  Follow up  6 months.  Signed, Satira Sark, MD, [facility] 05/25/2022 12:05 PM    [facility] [place], [place], [place] [address] Phone: [phone]; Fax: [phone]

## 2022-06-03 IMAGING — CR DG CHEST 2V
2 series · 2 of 2 positions shown · non-contrast
Comparison: None.

CLINICAL DATA: Preop for transcatheter aortic valve repair.

EXAM:
CHEST - 2 VIEW

[w chest pa]
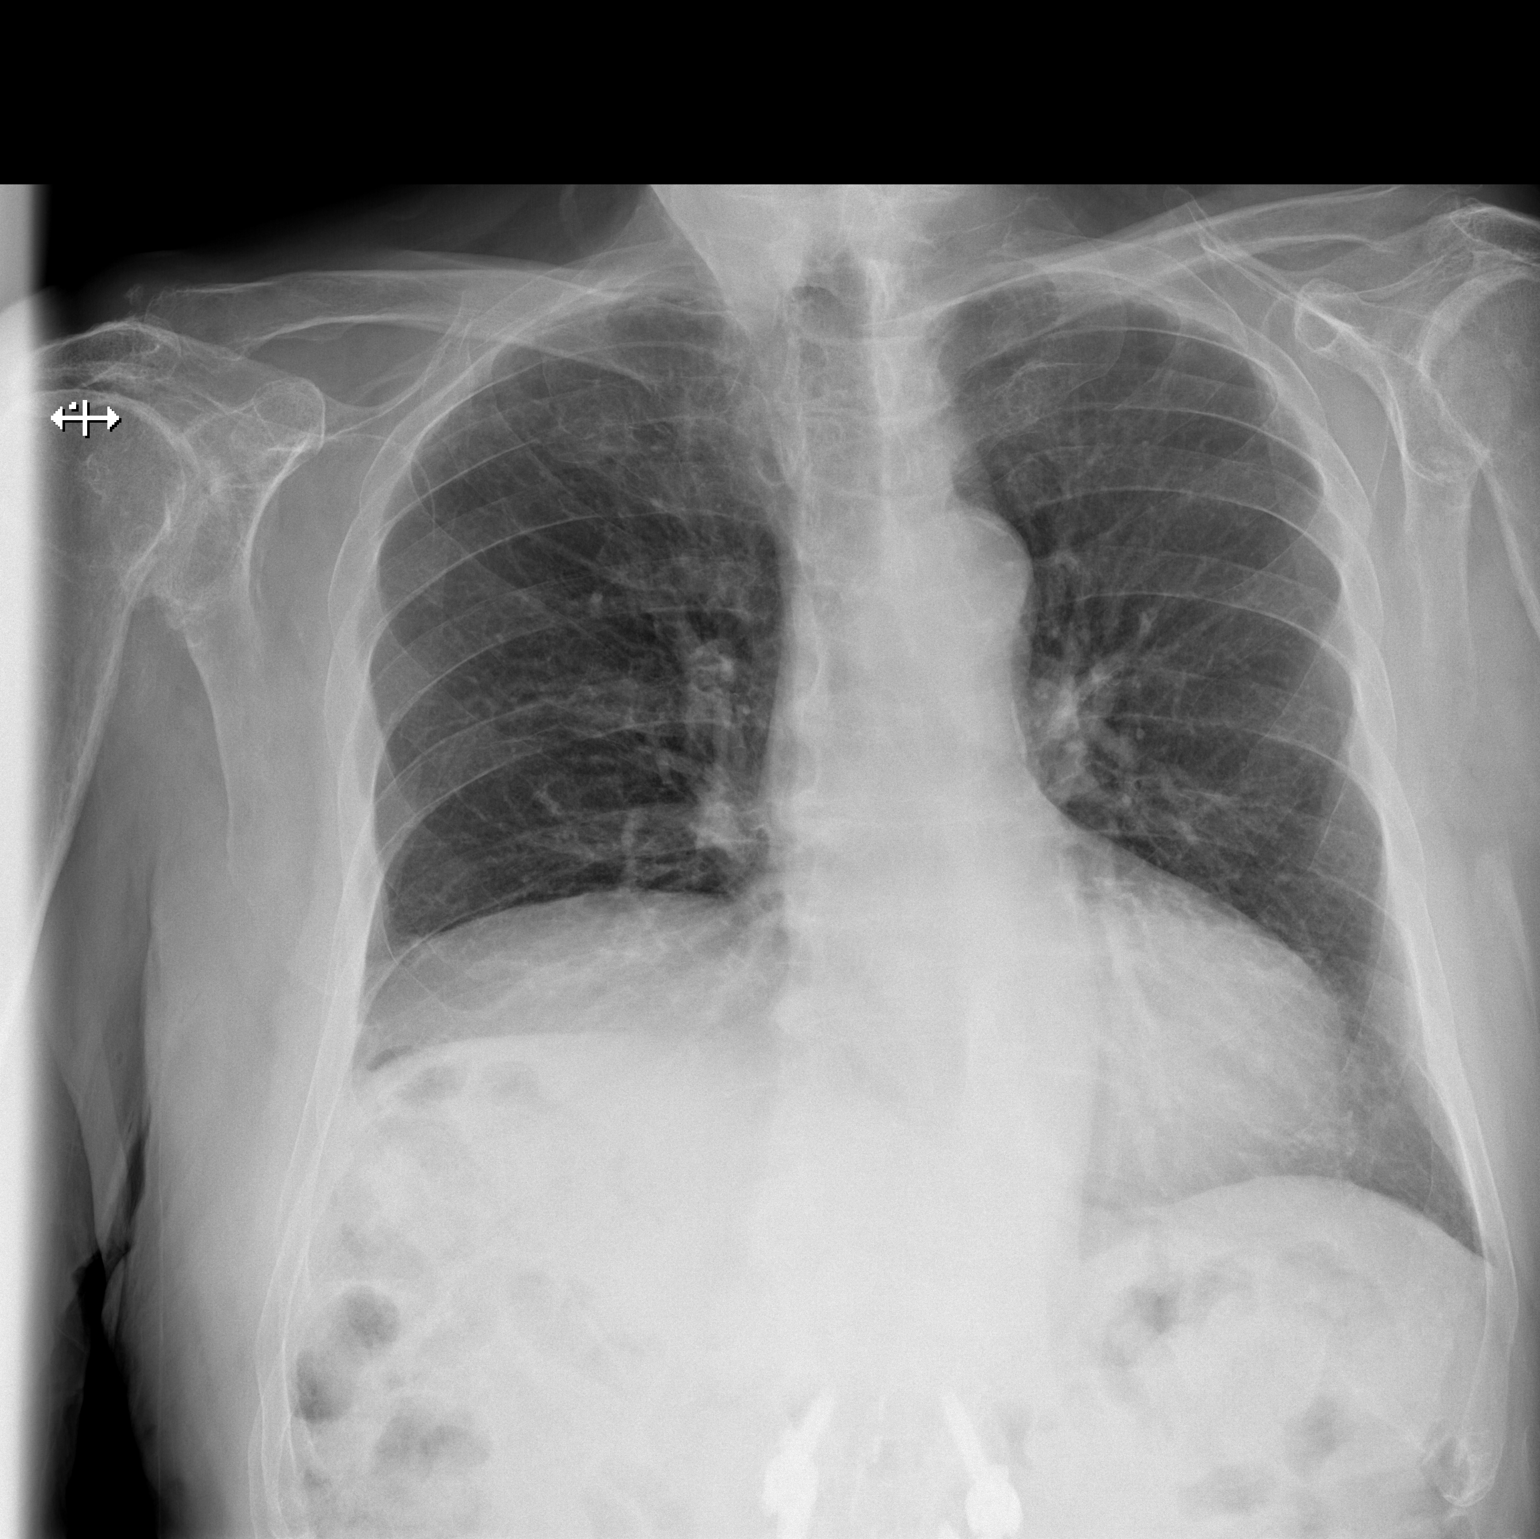

[w chest lat]
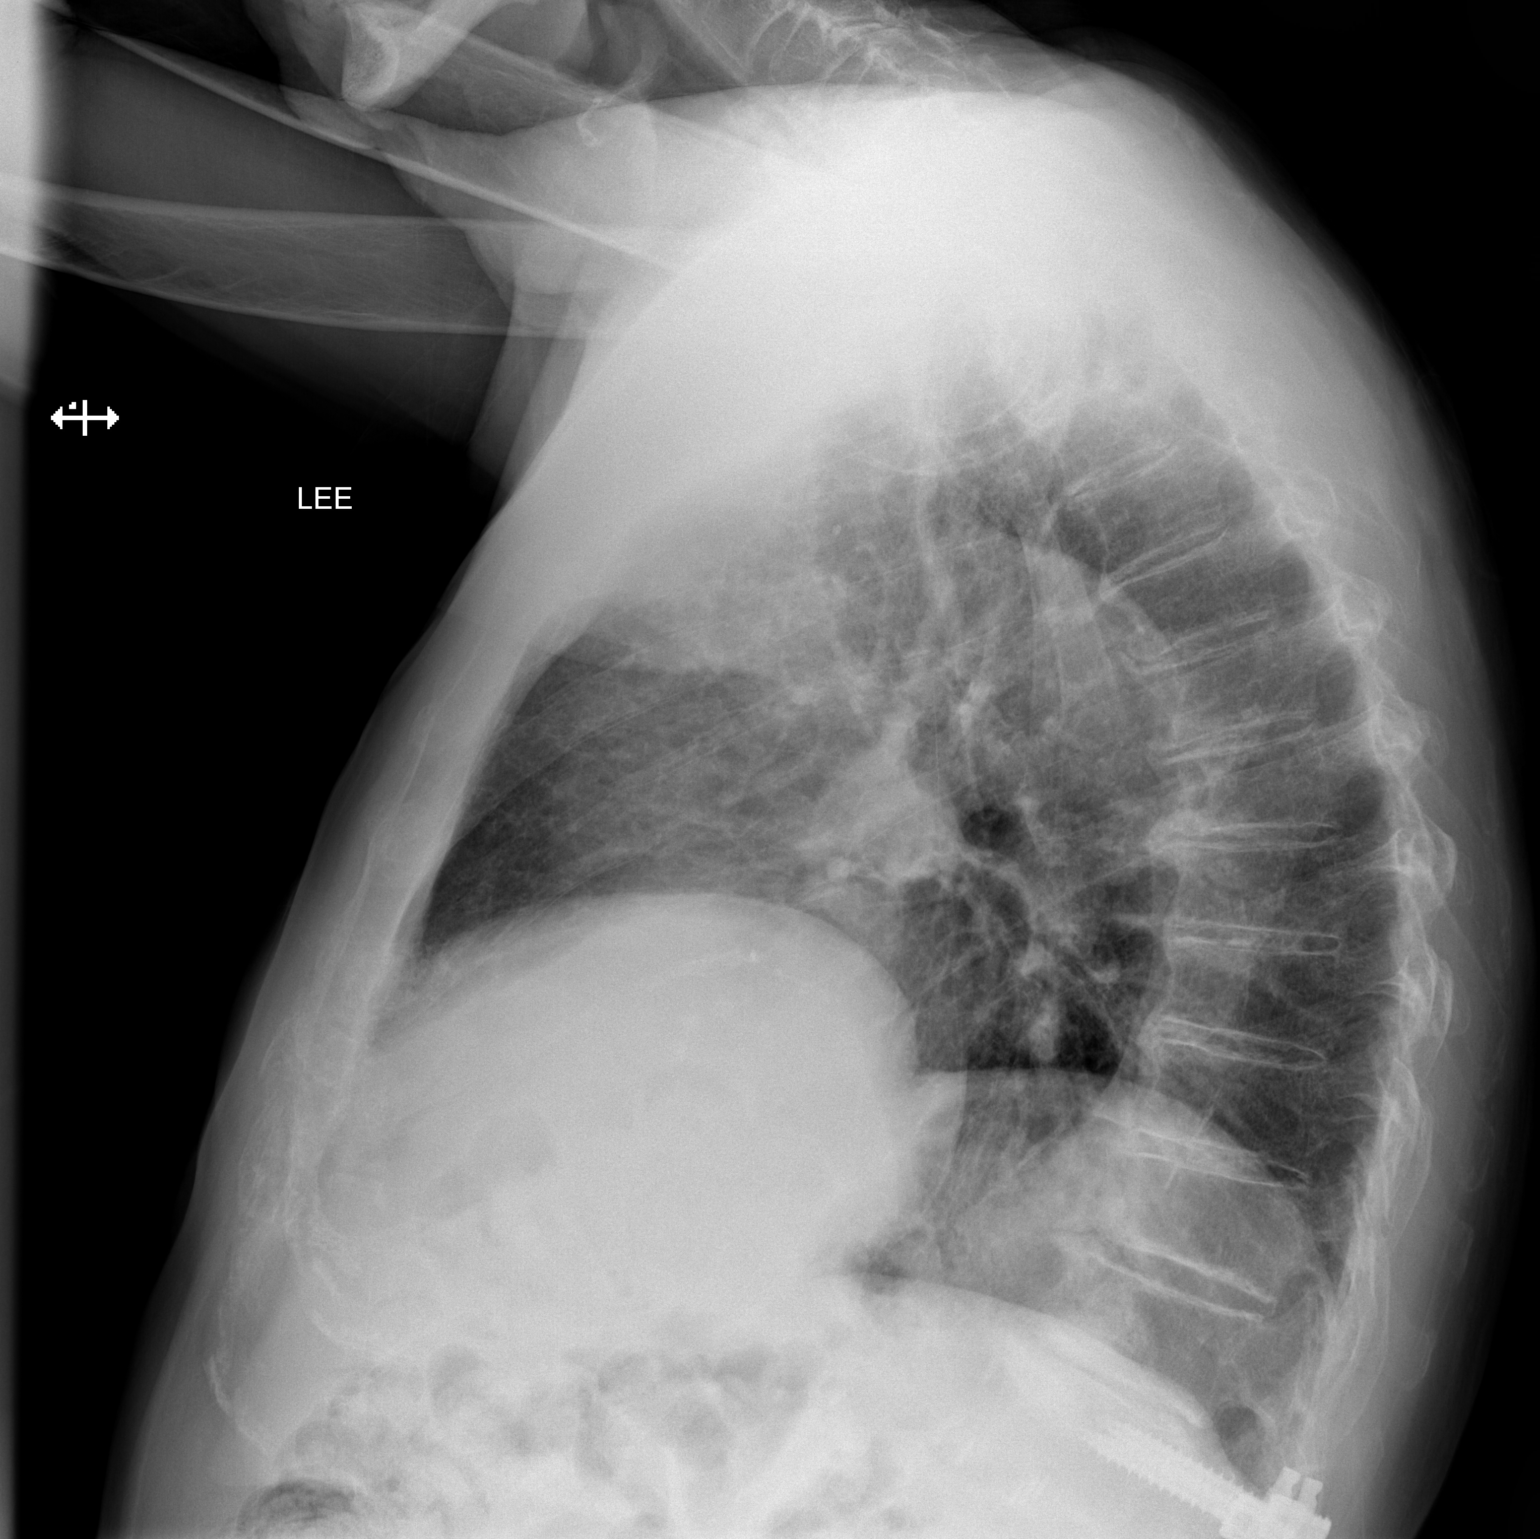

[2 of 2 positions shown; findings below may reference images not displayed]

FINDINGS: The heart size and mediastinal contours are within normal limits.
Both lungs are clear. No pneumothorax or pleural effusion is noted.
Elevated right hemidiaphragm is noted with probable eventration seen
posteriorly. The visualized skeletal structures are unremarkable.
IMPRESSION: No active cardiopulmonary disease.

## 2022-06-07 IMAGING — DX DG CHEST 1V PORT
1 series · 1 of 1 positions shown · non-contrast
Comparison: Preoperative radiograph 04/11/2020.  CT 04/01/2020

CLINICAL DATA: Status post TAVR.

EXAM:
PORTABLE CHEST 1 VIEW

[chest ap]
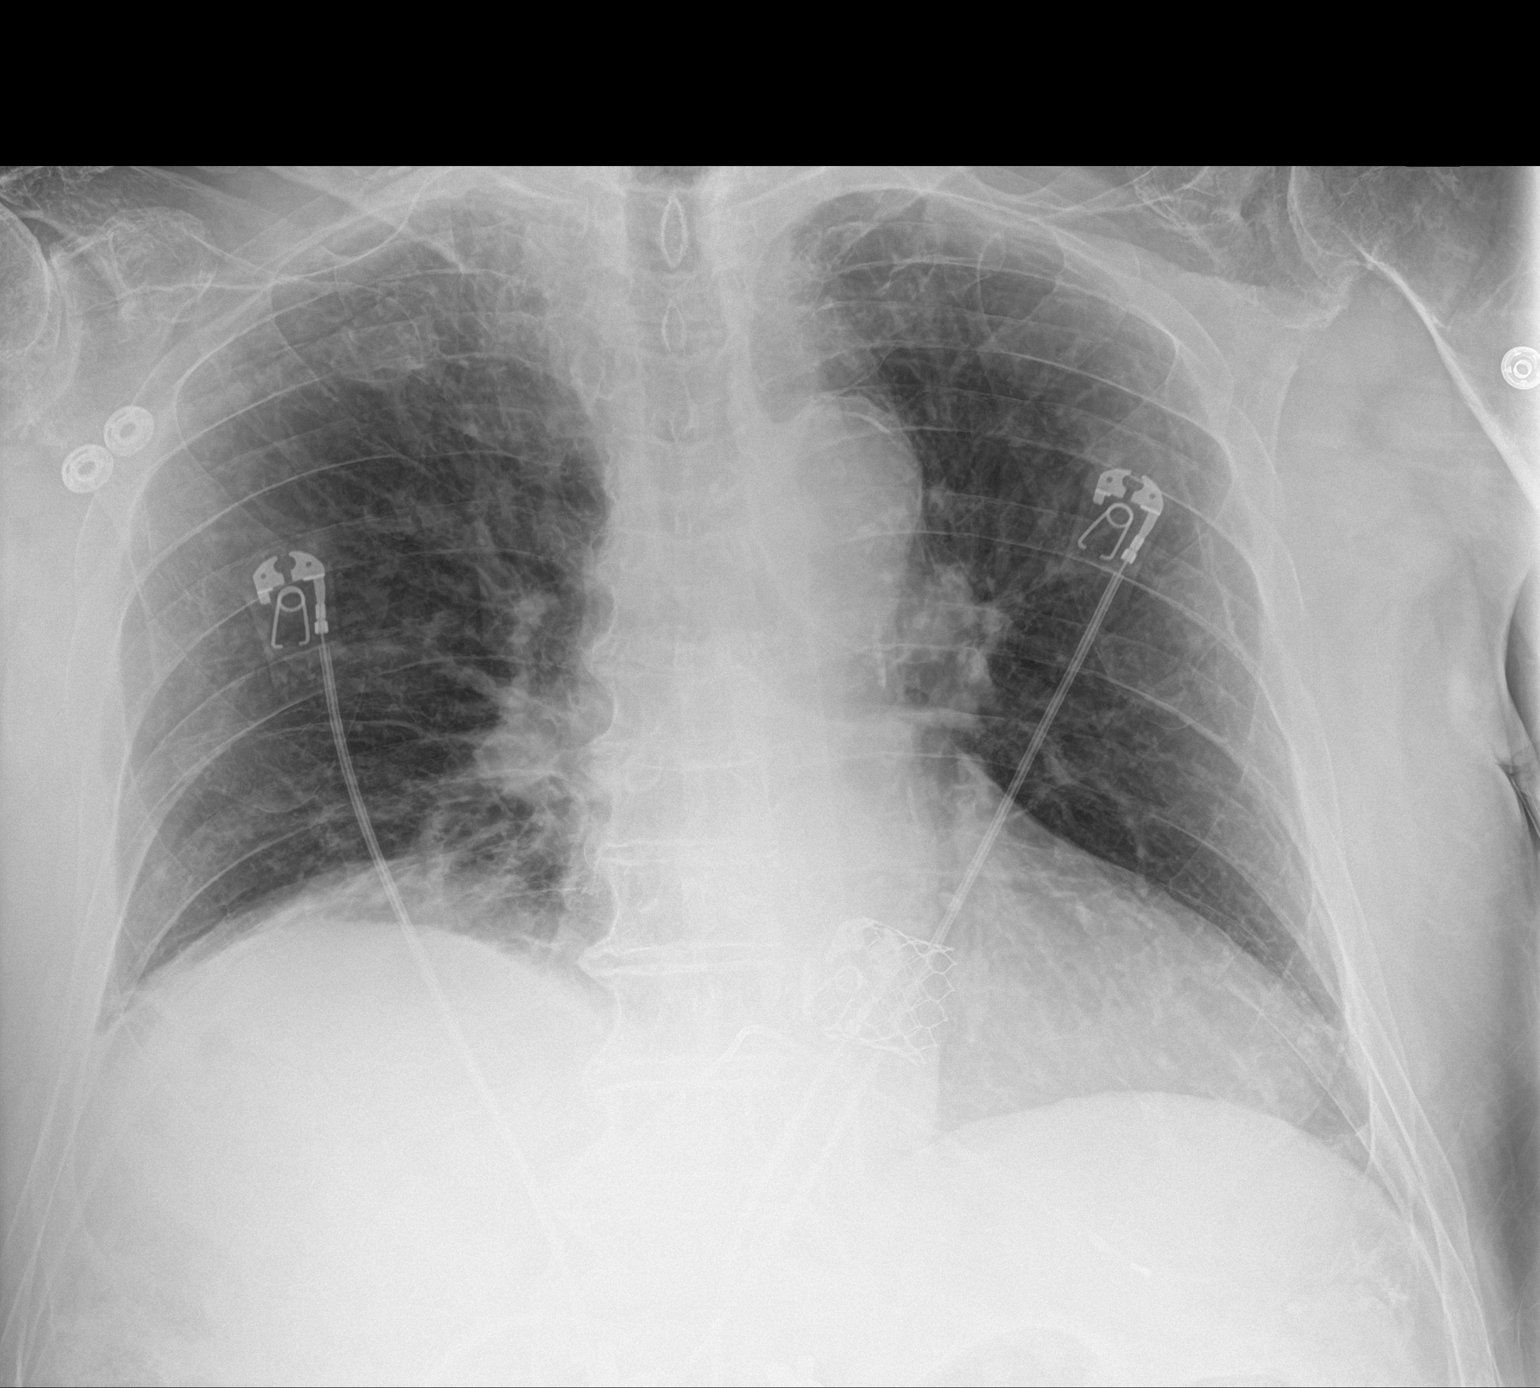

[1 of 1 positions shown; findings below may reference images not displayed]

FINDINGS: Interval TAVR. Lower lung volumes from prior exam. Upper normal
heart size likely accentuated by technique. Aortic atherosclerosis.
Unchanged mediastinal contours. Mild elevation of right
hemidiaphragm. No pulmonary edema. No pneumothorax or significant
pleural effusion. No focal airspace disease. Degenerative change in
the shoulders and spine. Overlying EKG leads. Overlying pacing
device from an inferior approach versus EKG lead projecting over the
lower heart border.
IMPRESSION: 1. Post TAVR without immediate postoperative complication
2.  Aortic Atherosclerosis ([id number]).

## 2022-07-01 DIAGNOSIS — H9193 Unspecified hearing loss, bilateral: Secondary | ICD-10-CM | POA: Diagnosis not present

## 2022-07-01 DIAGNOSIS — Z87891 Personal history of nicotine dependence: Secondary | ICD-10-CM | POA: Diagnosis not present

## 2022-07-01 DIAGNOSIS — M1990 Unspecified osteoarthritis, unspecified site: Secondary | ICD-10-CM | POA: Diagnosis not present

## 2022-07-01 DIAGNOSIS — I10 Essential (primary) hypertension: Secondary | ICD-10-CM | POA: Diagnosis not present

## 2022-07-01 DIAGNOSIS — E663 Overweight: Secondary | ICD-10-CM | POA: Diagnosis not present

## 2022-07-01 DIAGNOSIS — E785 Hyperlipidemia, unspecified: Secondary | ICD-10-CM | POA: Diagnosis not present

## 2022-07-01 DIAGNOSIS — G4700 Insomnia, unspecified: Secondary | ICD-10-CM | POA: Diagnosis not present

## 2022-07-01 DIAGNOSIS — Z7982 Long term (current) use of aspirin: Secondary | ICD-10-CM | POA: Diagnosis not present

## 2022-07-01 DIAGNOSIS — K5900 Constipation, unspecified: Secondary | ICD-10-CM | POA: Diagnosis not present

## 2022-07-01 DIAGNOSIS — G5791 Unspecified mononeuropathy of right lower limb: Secondary | ICD-10-CM | POA: Diagnosis not present

## 2022-07-01 DIAGNOSIS — I700 Atherosclerosis of aorta: Secondary | ICD-10-CM | POA: Diagnosis not present

## 2022-07-01 DIAGNOSIS — G8929 Other chronic pain: Secondary | ICD-10-CM | POA: Diagnosis not present

## 2022-07-14 ENCOUNTER — Ambulatory Visit (INDEPENDENT_AMBULATORY_CARE_PROVIDER_SITE_OTHER): Payer: PPO

## 2022-07-14 DIAGNOSIS — I442 Atrioventricular block, complete: Secondary | ICD-10-CM

## 2022-07-14 LAB — CUP PACEART REMOTE DEVICE CHECK
Battery Remaining Longevity: 95 mo
Battery Remaining Percentage: 81 %
Battery Voltage: 3.01 V
Brady Statistic AP VP Percent: 13 %
Brady Statistic AP VS Percent: 1 %
Brady Statistic AS VP Percent: 84 %
Brady Statistic AS VS Percent: 1.3 %
Brady Statistic RA Percent Paced: 11 %
Brady Statistic RV Percent Paced: 97 %
Date Time Interrogation Session: 20230906020017
Implantable Lead Implant Date: 20210609
Implantable Lead Implant Date: 20210609
Implantable Lead Location: 753859
Implantable Lead Location: 753860
Implantable Pulse Generator Implant Date: 20210609
Lead Channel Impedance Value: 400 Ohm
Lead Channel Impedance Value: 410 Ohm
Lead Channel Pacing Threshold Amplitude: 0.5 V
Lead Channel Pacing Threshold Amplitude: 0.875 V
Lead Channel Pacing Threshold Pulse Width: 0.4 ms
Lead Channel Pacing Threshold Pulse Width: 0.5 ms
Lead Channel Sensing Intrinsic Amplitude: 5 mV
Lead Channel Sensing Intrinsic Amplitude: 9.4 mV
Lead Channel Setting Pacing Amplitude: 1.125
Lead Channel Setting Pacing Amplitude: 2 V
Lead Channel Setting Pacing Pulse Width: 0.5 ms
Lead Channel Setting Sensing Sensitivity: 2 mV
Pulse Gen Model: 2272
Pulse Gen Serial Number: 3829143

## 2022-08-02 NOTE — Progress Notes (Signed)
Remote pacemaker transmission.   

## 2022-09-22 DIAGNOSIS — D0461 Carcinoma in situ of skin of right upper limb, including shoulder: Secondary | ICD-10-CM | POA: Diagnosis not present

## 2022-09-22 DIAGNOSIS — C44319 Basal cell carcinoma of skin of other parts of face: Secondary | ICD-10-CM | POA: Diagnosis not present

## 2022-09-22 DIAGNOSIS — L570 Actinic keratosis: Secondary | ICD-10-CM | POA: Diagnosis not present

## 2022-09-22 DIAGNOSIS — X32XXXA Exposure to sunlight, initial encounter: Secondary | ICD-10-CM | POA: Diagnosis not present

## 2022-10-13 ENCOUNTER — Ambulatory Visit (INDEPENDENT_AMBULATORY_CARE_PROVIDER_SITE_OTHER): Payer: PPO

## 2022-10-13 DIAGNOSIS — I442 Atrioventricular block, complete: Secondary | ICD-10-CM

## 2022-10-13 LAB — CUP PACEART REMOTE DEVICE CHECK
Battery Remaining Longevity: 91 mo
Battery Remaining Percentage: 78 %
Battery Voltage: 3.01 V
Brady Statistic AP VP Percent: 14 %
Brady Statistic AP VS Percent: 1 %
Brady Statistic AS VP Percent: 82 %
Brady Statistic AS VS Percent: 1.3 %
Brady Statistic RA Percent Paced: 11 %
Brady Statistic RV Percent Paced: 96 %
Date Time Interrogation Session: 20231206020026
Implantable Lead Connection Status: 753985
Implantable Lead Connection Status: 753985
Implantable Lead Implant Date: 20210609
Implantable Lead Implant Date: 20210609
Implantable Lead Location: 753859
Implantable Lead Location: 753860
Implantable Pulse Generator Implant Date: 20210609
Lead Channel Impedance Value: 410 Ohm
Lead Channel Impedance Value: 440 Ohm
Lead Channel Pacing Threshold Amplitude: 0.5 V
Lead Channel Pacing Threshold Amplitude: 0.875 V
Lead Channel Pacing Threshold Pulse Width: 0.4 ms
Lead Channel Pacing Threshold Pulse Width: 0.5 ms
Lead Channel Sensing Intrinsic Amplitude: 5 mV
Lead Channel Sensing Intrinsic Amplitude: 9.3 mV
Lead Channel Setting Pacing Amplitude: 1.125
Lead Channel Setting Pacing Amplitude: 2 V
Lead Channel Setting Pacing Pulse Width: 0.5 ms
Lead Channel Setting Sensing Sensitivity: 2 mV
Pulse Gen Model: 2272
Pulse Gen Serial Number: 3829143

## 2022-10-18 DIAGNOSIS — Z08 Encounter for follow-up examination after completed treatment for malignant neoplasm: Secondary | ICD-10-CM | POA: Diagnosis not present

## 2022-10-18 DIAGNOSIS — Z85828 Personal history of other malignant neoplasm of skin: Secondary | ICD-10-CM | POA: Diagnosis not present

## 2022-10-18 DIAGNOSIS — D0439 Carcinoma in situ of skin of other parts of face: Secondary | ICD-10-CM | POA: Diagnosis not present

## 2022-10-27 DIAGNOSIS — M1712 Unilateral primary osteoarthritis, left knee: Secondary | ICD-10-CM | POA: Diagnosis not present

## 2022-10-27 DIAGNOSIS — M25551 Pain in right hip: Secondary | ICD-10-CM | POA: Diagnosis not present

## 2022-10-27 DIAGNOSIS — M1611 Unilateral primary osteoarthritis, right hip: Secondary | ICD-10-CM | POA: Diagnosis not present

## 2022-11-05 DIAGNOSIS — M1611 Unilateral primary osteoarthritis, right hip: Secondary | ICD-10-CM | POA: Diagnosis not present

## 2022-11-05 NOTE — Progress Notes (Signed)
Remote pacemaker transmission.   

## 2022-11-25 ENCOUNTER — Ambulatory Visit: Payer: PPO | Attending: Cardiology | Admitting: Cardiology

## 2022-11-25 ENCOUNTER — Encounter: Payer: Self-pay | Admitting: Cardiology

## 2022-11-25 VITALS — BP 133/88 | HR 63 | Ht 66.0 in | Wt 174.9 lb

## 2022-11-25 DIAGNOSIS — I2510 Atherosclerotic heart disease of native coronary artery without angina pectoris: Secondary | ICD-10-CM

## 2022-11-25 DIAGNOSIS — E782 Mixed hyperlipidemia: Secondary | ICD-10-CM | POA: Diagnosis not present

## 2022-11-25 DIAGNOSIS — I442 Atrioventricular block, complete: Secondary | ICD-10-CM

## 2022-11-25 DIAGNOSIS — Z952 Presence of prosthetic heart valve: Secondary | ICD-10-CM | POA: Diagnosis not present

## 2022-11-25 MED ORDER — ROSUVASTATIN CALCIUM 20 MG PO TABS: 20.0000 mg | ORAL_TABLET | Freq: Every day | ORAL | 3 refills | Status: DC

## 2022-11-25 NOTE — Patient Instructions (Addendum)
Medication Instructions:  Your physician has recommended you make the following change in your medication:  Increase crestor to 20 mg daily Continue other medications the same  Labwork: none  Testing/Procedures: Your physician has requested that you have an echocardiogram in 6 months just before your next visit in July 2024. Echocardiography is a painless test that uses sound waves to create images of your heart. It provides your doctor with information about the size and shape of your heart and how well your heart's chambers and valves are working. This procedure takes approximately one hour. There are no restrictions for this procedure. Please do NOT wear cologne, perfume, aftershave, or lotions (deodorant is allowed). Please arrive 15 minutes prior to your appointment time.  Follow-Up: Your physician recommends that you schedule a follow-up appointment in: 6 months  Any Other Special Instructions Will Be Listed Below (If Applicable).  If you need a refill on your cardiac medications before your next appointment, please call your pharmacy.

## 2022-11-25 NOTE — Progress Notes (Signed)
Cardiology Office Note  Date: 11/25/2022   ID: Corey Aguilar, DOB [birth year], MRN [id number]  PCP:  Monico Blitz, MD  Cardiologist:  Rozann Lesches, MD Electrophysiologist:  Constance Haw, MD   Chief Complaint  Patient presents with   Cardiac follow-up    History of Present Illness: Corey Aguilar is an 89 y.o. male last seen in July 2023.  He is here for a routine visit.  Reports no angina with typical activities, stable NYHA class II dyspnea.  He has not had any falls or syncope.  Occasionally feels lightheaded when he is standing, but has not documented any frank hypotension when he checks blood pressures at home.  St. Jude pacemaker in place with follow-up with Dr. Curt Bears.  Recent device interrogation indicated normal function.  I personally reviewed his ECG today which shows dual-chamber pacing.  Most recent echocardiogram was in July 2023 at which point TAVR function was stable, mean gradient 14 mmHg and mild paravalvular regurgitation.  Will get an updated study in July of this year.  I reviewed his medications.  States that he has been compliant with Crestor at 5 mg daily.  His LDL was 123 in June of last year.  We discussed intensification of therapy.  Past Medical History:  Diagnosis Date   Aortic stenosis    Arthritis    BCC (basal cell carcinoma of skin) 12/11/2014   V of neck (CX35FU)   BCC (basal cell carcinoma of skin) 04/07/1989   Left cheek   CAD (coronary artery disease)    Moderate, multivessel disease by cardiac catheterization May 2021   History of kidney stones    Left hip pain    Lumbago    S/P placement of cardiac pacemaker 04/16/2020   Complete heart block, St. Jude device   S/P TAVR (transcatheter aortic valve replacement) 04/15/2020   s/p TAVR with a 26 mm Edwards S3U via the TF approach by Dr. Angelena Form and Roxy Manns   SCC (squamous cell carcinoma) 07/20/2011   Left forearm (CX35FU)   SCC (squamous cell carcinoma) 10/05/2011   Left  ear (CX35FU)   SCC (squamous cell carcinoma) 08/22/2018   Left jawline (CX35FU)   Superficial basal cell carcinoma (BCC) 07/20/2011   Tip of nose (CX35FU)    Current Outpatient Medications  Medication Sig Dispense Refill   acetaminophen (TYLENOL) 650 MG CR tablet Take 650 mg by mouth at bedtime as needed for pain (arthritis).     aspirin EC 81 MG tablet Take 81 mg by mouth daily. Swallow whole.     gabapentin (NEURONTIN) 100 MG capsule Take 100 mg by mouth at bedtime.     metoprolol succinate (TOPROL-XL) 50 MG 24 hr tablet Take 50 mg by mouth daily. Take with or immediately following a meal.     rosuvastatin (CRESTOR) 20 MG tablet Take 1 tablet (20 mg total) by mouth daily. 90 tablet 3   No current facility-administered medications for this visit.   Allergies:  Penicillins and Ibuprofen   ROS: No orthopnea or PND.  Chronic right foot drop.  Physical Exam: VS:  BP 133/88   Pulse 63   Ht '5\' 6"'$  (1.676 m)   Wt 174 lb 14.4 oz (79.3 kg)   SpO2 95%   BMI 28.23 kg/m , BMI Body mass index is 28.23 kg/m.  Wt Readings from Last 3 Encounters:  11/25/22 174 lb 14.4 oz (79.3 kg)  05/25/22 180 lb (81.6 kg)  11/24/21 179 lb (81.2 kg)  General: Patient appears comfortable at rest. HEENT: Conjunctiva and lids normal. Neck: Supple, no elevated JVP or carotid bruits. Lungs: Clear to auscultation, nonlabored breathing at rest. Cardiac: Regular rate and rhythm, no S3, 2/6 systolic murmur. Extremities: No pitting edema.  ECG:  An ECG dated 11/24/2021 was personally reviewed today and demonstrated:  Ventricular paced rhythm with atrial tracking.  Recent Labwork:  June 2023: BUN 18, creatinine 0.84, potassium 4.2, AST 21, ALT 12, cholesterol 195, triglycerides 75, HDL 58, LDL 123, TSH 2.19  Other Studies Reviewed Today:  Echocardiogram 05/20/2022:  1. Left ventricular ejection fraction, by estimation, is 60 to 65%. The  left ventricle has normal function. The left ventricle has no  regional  wall motion abnormalities. Left ventricular diastolic parameters are  consistent with Grade I diastolic  dysfunction (impaired relaxation). Elevated left atrial pressure.   2. Right ventricular systolic function is normal. The right ventricular  size is normal. Tricuspid regurgitation signal is inadequate for assessing  PA pressure.   3. The mitral valve is abnormal. No evidence of mitral valve  regurgitation. Mild mitral stenosis. Moderate mitral annular  calcification.   4. Edwards Sapien 3 Ultra THV size 26 mm is in the AV position.. The  aortic valve has been repaired/replaced. Aortic valve regurgitation is  mild. No aortic stenosis is present.   5. Aortic dilatation noted. There is mild dilatation of the ascending  aorta, measuring 36 mm.   Assessment and Plan:  1.  History of severe calcific degenerative aortic stenosis status post TAVR in June 2021.  He is clinically stable at this time and we will plan on a follow-up echocardiogram in July of this year.  He has mild paravalvular regurgitation with previous AV mean gradient 14 mmHg.  Continue aspirin.  2.  Mixed hyperlipidemia, not optimally controlled with LDL 123 at last check.  Change Crestor to 20 mg daily.  He will have repeat lab work with his PCP in June.  3.  Complete heart block status post St. Jude pacemaker with follow-up by Dr. Curt Bears.  Recent device interrogation indicates normal function.  4.  Nonobstructive CAD without angina.  Continue aspirin and statin.  Medication Adjustments/Labs and Tests Ordered: Current medicines are reviewed at length with the patient today.  Concerns regarding medicines are outlined above.   Tests Ordered: Orders Placed This Encounter  Procedures   EKG 12-Lead   ECHOCARDIOGRAM COMPLETE    Medication Changes: Meds ordered this encounter  Medications   rosuvastatin (CRESTOR) 20 MG tablet    Sig: Take 1 tablet (20 mg total) by mouth daily.    Dispense:  90 tablet     Refill:  3    11/25/2022 dose increase    Disposition:  Follow up  after echocardiogram in 6 months.  Signed, Satira Sark, MD, [facility] 11/25/2022 2:49 PM    [facility] [place], [place], [place] [address] Phone: [phone]; Fax: [phone]

## 2022-11-26 ENCOUNTER — Telehealth: Payer: Self-pay | Admitting: Cardiology

## 2022-11-26 DIAGNOSIS — M1611 Unilateral primary osteoarthritis, right hip: Secondary | ICD-10-CM | POA: Diagnosis not present

## 2022-11-26 DIAGNOSIS — M1712 Unilateral primary osteoarthritis, left knee: Secondary | ICD-10-CM | POA: Diagnosis not present

## 2022-11-26 DIAGNOSIS — M25551 Pain in right hip: Secondary | ICD-10-CM | POA: Diagnosis not present

## 2022-11-26 MED ORDER — ATORVASTATIN CALCIUM 20 MG PO TABS: 20.0000 mg | ORAL_TABLET | Freq: Every day | ORAL | 6 refills | Status: DC

## 2022-11-26 NOTE — Telephone Encounter (Signed)
Spoke with wife Mardene Celeste) - states patient was in office yesterday to see Dr. Domenic Polite - noticed that his medications were not correct on his after visit summary.  States his pcp stopped the Rosuvastatin several months back as he was unable to tolerate due to joint aches and pains.  States he was changed to Atorvastatin '10mg'$  daily by his pcp.  States his Toprol dose is '25mg'$  - doing 1/2 tab (12.'5mg'$ ) daily.  And he is no longer on Gabapentin.  Medication list has been updated to reflect these changes.  Wife questions if you wanted him to increase the Atorvastatin to '20mg'$  daily.

## 2022-11-26 NOTE — Telephone Encounter (Signed)
Patient notified via detailed voice message.  New prescription sent to pharmacy now.

## 2022-11-26 NOTE — Telephone Encounter (Signed)
Patient called to talk with Dr. Domenic Polite or nurse in regards to medication.

## 2023-01-12 ENCOUNTER — Ambulatory Visit: Payer: PPO

## 2023-01-12 DIAGNOSIS — I442 Atrioventricular block, complete: Secondary | ICD-10-CM

## 2023-01-12 LAB — CUP PACEART REMOTE DEVICE CHECK
Battery Remaining Longevity: 86 mo
Battery Remaining Percentage: 75 %
Battery Voltage: 3.01 V
Brady Statistic AP VP Percent: 14 %
Brady Statistic AP VS Percent: 1 %
Brady Statistic AS VP Percent: 82 %
Brady Statistic AS VS Percent: 1.3 %
Brady Statistic RA Percent Paced: 12 %
Brady Statistic RV Percent Paced: 96 %
Date Time Interrogation Session: 20240306020013
Implantable Lead Connection Status: 753985
Implantable Lead Connection Status: 753985
Implantable Lead Implant Date: 20210609
Implantable Lead Implant Date: 20210609
Implantable Lead Location: 753859
Implantable Lead Location: 753860
Implantable Pulse Generator Implant Date: 20210609
Lead Channel Impedance Value: 400 Ohm
Lead Channel Impedance Value: 410 Ohm
Lead Channel Pacing Threshold Amplitude: 0.5 V
Lead Channel Pacing Threshold Amplitude: 1.125 V
Lead Channel Pacing Threshold Pulse Width: 0.4 ms
Lead Channel Pacing Threshold Pulse Width: 0.5 ms
Lead Channel Sensing Intrinsic Amplitude: 12 mV
Lead Channel Sensing Intrinsic Amplitude: 5 mV
Lead Channel Setting Pacing Amplitude: 1.375
Lead Channel Setting Pacing Amplitude: 2 V
Lead Channel Setting Pacing Pulse Width: 0.5 ms
Lead Channel Setting Sensing Sensitivity: 2 mV
Pulse Gen Model: 2272
Pulse Gen Serial Number: 3829143

## 2023-02-01 ENCOUNTER — Encounter: Payer: PPO | Admitting: Cardiology

## 2023-02-16 NOTE — Progress Notes (Signed)
Remote pacemaker transmission.   

## 2023-03-04 ENCOUNTER — Encounter: Payer: Self-pay | Admitting: Cardiology

## 2023-03-04 ENCOUNTER — Ambulatory Visit: Payer: PPO | Attending: Cardiology | Admitting: Cardiology

## 2023-03-04 VITALS — BP 136/70 | HR 64 | Ht 66.0 in | Wt 175.4 lb

## 2023-03-04 DIAGNOSIS — Z952 Presence of prosthetic heart valve: Secondary | ICD-10-CM

## 2023-03-04 DIAGNOSIS — I442 Atrioventricular block, complete: Secondary | ICD-10-CM | POA: Diagnosis not present

## 2023-03-04 LAB — CUP PACEART INCLINIC DEVICE CHECK
Battery Remaining Longevity: 88 mo
Battery Voltage: 3.01 V
Brady Statistic RA Percent Paced: 12 %
Brady Statistic RV Percent Paced: 96 %
Date Time Interrogation Session: 20240426155215
Implantable Lead Connection Status: 753985
Implantable Lead Connection Status: 753985
Implantable Lead Implant Date: 20210609
Implantable Lead Implant Date: 20210609
Implantable Lead Location: 753859
Implantable Lead Location: 753860
Implantable Pulse Generator Implant Date: 20210609
Lead Channel Impedance Value: 437.5 Ohm
Lead Channel Impedance Value: 437.5 Ohm
Lead Channel Pacing Threshold Amplitude: 0.5 V
Lead Channel Pacing Threshold Amplitude: 0.5 V
Lead Channel Pacing Threshold Amplitude: 0.75 V
Lead Channel Pacing Threshold Amplitude: 0.75 V
Lead Channel Pacing Threshold Pulse Width: 0.4 ms
Lead Channel Pacing Threshold Pulse Width: 0.4 ms
Lead Channel Pacing Threshold Pulse Width: 0.5 ms
Lead Channel Pacing Threshold Pulse Width: 0.5 ms
Lead Channel Sensing Intrinsic Amplitude: 5 mV
Lead Channel Setting Pacing Amplitude: 1.125
Lead Channel Setting Pacing Amplitude: 2 V
Lead Channel Setting Pacing Pulse Width: 0.5 ms
Lead Channel Setting Sensing Sensitivity: 2 mV
Pulse Gen Model: 2272
Pulse Gen Serial Number: 3829143

## 2023-03-04 NOTE — Progress Notes (Signed)
Electrophysiology Office Note   Date:  03/04/2023   ID:  Corey Aguilar, DOB 12/04/1933, MRN [id number]  PCP:  Kirstie Peri, MD  Cardiologist:  Clifton James Primary Electrophysiologist:  Itzamara Casas Jorja Loa, MD    Chief Complaint: pacemaker   History of Present Illness: Corey Aguilar is a 89 y.o. male who is being seen today for the evaluation of pacemaker at the request of Kirstie Peri, MD. Presenting today for electrophysiology evaluation.  He has a history significant for left bundle branch block and severe AS.  He underwent TAVR 04/15/2020.  Post TAVR he was noted to be in complete heart block.  He is post [employer] dual-chamber pacemaker implanted 04/16/2020.  Today, denies symptoms of palpitations, chest pain, shortness of breath, orthopnea, PND, lower extremity edema, claudication, dizziness, presyncope, syncope, bleeding, or neurologic sequela. The patient is tolerating medications without difficulties.      Past Medical History:  Diagnosis Date   Aortic stenosis    Arthritis    BCC (basal cell carcinoma of skin) 12/11/2014   V of neck (CX35FU)   BCC (basal cell carcinoma of skin) 04/07/1989   Left cheek   CAD (coronary artery disease)    Moderate, multivessel disease by cardiac catheterization May 2021   History of kidney stones    Left hip pain    Lumbago    S/P placement of cardiac pacemaker 04/16/2020   Complete heart block, St. Jude device   S/P TAVR (transcatheter aortic valve replacement) 04/15/2020   s/p TAVR with a 26 mm Edwards S3U via the TF approach by Dr. Clifton James and Cornelius Moras   SCC (squamous cell carcinoma) 07/20/2011   Left forearm (CX35FU)   SCC (squamous cell carcinoma) 10/05/2011   Left ear (CX35FU)   SCC (squamous cell carcinoma) 08/22/2018   Left jawline (CX35FU)   Superficial basal cell carcinoma (BCC) 07/20/2011   Tip of nose (CX35FU)   Past Surgical History:  Procedure Laterality Date   BACK SURGERY  2015   CARDIAC CATHETERIZATION   03/27/2020   EYE SURGERY     cataract sx in both eyes   JOINT REPLACEMENT Left    hip   PACEMAKER IMPLANT N/A 04/16/2020   Procedure: PACEMAKER IMPLANT;  Surgeon: Regan Lemming, MD;  Location: [facility] INVASIVE CV [facility];  Service: Cardiovascular;  Laterality: N/A;   RIGHT/LEFT HEART CATH AND CORONARY ANGIOGRAPHY N/A 03/27/2020   Procedure: RIGHT/LEFT HEART CATH AND CORONARY ANGIOGRAPHY;  Surgeon: Kathleene Hazel, MD;  Location: [facility] INVASIVE CV LAB;  Service: Cardiovascular;  Laterality: N/A;   TEE WITHOUT CARDIOVERSION N/A 04/15/2020   Procedure: TRANSESOPHAGEAL ECHOCARDIOGRAM (TEE);  Surgeon: Kathleene Hazel, MD;  Location: [facility] INVASIVE CV LAB;  Service: Open Heart Surgery;  Laterality: N/A;   TOTAL HIP ARTHROPLASTY Left 05/29/2019   Procedure: TOTAL HIP ARTHROPLASTY ANTERIOR APPROACH;  Surgeon: Durene Romans, MD;  Location: [facility] OR[facility];  Service: Orthopedics;  Laterality: Left;  70 mins   TRANSCATHETER AORTIC VALVE REPLACEMENT, TRANSFEMORAL N/A 04/15/2020   Procedure: TRANSCATHETER AORTIC VALVE REPLACEMENT, TRANSFEMORAL;  Surgeon: Kathleene Hazel, MD;  Location: [facility] INVASIVE CV LAB;  Service: Open Heart Surgery;  Laterality: N/A;     Current Outpatient Medications  Medication Sig Dispense Refill   acetaminophen (TYLENOL) 650 MG CR tablet Take 650 mg by mouth at bedtime as needed for pain (arthritis).     aspirin EC 81 MG tablet Take 81 mg by mouth daily. Swallow whole.     atorvastatin (LIPITOR) 20 MG tablet Take 1  tablet (20 mg total) by mouth daily. 30 tablet 6   metoprolol succinate (TOPROL-XL) 25 MG 24 hr tablet Take 12.5 mg by mouth daily. Take with or immediately following a meal.     No current facility-administered medications for this visit.    Allergies:   Penicillins and Ibuprofen   Social History:  The patient  reports that he quit smoking about 67 years ago. His smoking use included cigarettes. He has never used smokeless tobacco. He reports current alcohol use. He  reports that he does not use drugs.   Family History:  The patient's family history includes Cancer in his brother and sister; Diabetes in his mother; Heart attack in his father; Heart disease in his mother.   ROS:  Please see the history of present illness.   Otherwise, review of systems is positive for none.   All other systems are reviewed and negative.   PHYSICAL EXAM: VS:  BP 136/70   Pulse 64   Ht 5\' 6"  (1.676 m)   Wt 175 lb 6.4 oz (79.6 kg)   SpO2 95%   BMI 28.31 kg/m  , BMI Body mass index is 28.31 kg/m. GEN: Well nourished, well developed, in no acute distress  HEENT: normal  Neck: no JVD, carotid bruits, or masses Cardiac: RRR; no murmurs, rubs, or gallops,no edema  Respiratory:  clear to auscultation bilaterally, normal work of breathing GI: soft, nontender, nondistended, + BS MS: no deformity or atrophy  Skin: warm and dry, device site well healed Neuro:  Strength and sensation are intact Psych: euthymic mood, full affect  EKG:  EKG is not ordered today. Personal review of the ekg ordered 11/25/22 shows sinus rhythm, V paced  Personal review of the device interrogation today. Results in Paceart    Recent Labs: No results found for requested labs within last 365 days.    Lipid Panel  No results found for: "CHOL", "TRIG", "HDL", "CHOLHDL", "VLDL", "LDLCALC", "LDLDIRECT"   Wt Readings from Last 3 Encounters:  03/04/23 175 lb 6.4 oz (79.6 kg)  11/25/22 174 lb 14.4 oz (79.3 kg)  05/25/22 180 lb (81.6 kg)      Other studies Reviewed: Additional studies/ records that were reviewed today include: TTE 05/22/20  Review of the above records today demonstrates:   1. Left ventricular ejection fraction, by estimation, is 50 to 55%. The  left ventricle has low normal function. The left ventricle demonstrates  regional wall motion abnormalities (see scoring diagram/findings for  description). Left ventricular diastolic   parameters are consistent with Grade I diastolic  dysfunction (impaired  relaxation). There is moderate hypokinesis of the left ventricular,  basal-mid inferior wall.   2. Right ventricular systolic function is low normal. The right  ventricular size is normal. There is normal pulmonary artery systolic  pressure.   3. The aortic valve has been repaired/replaced. There is a 26 mm Ultra,  stented (TAVR) valve present in the aortic position. Procedure Date:  04/15/20. Echo findings are consistent with trivial perivalvular leak of the  aortic prosthesis. Possible  restrictive VSD of the perimembranous septum, below the valve prosthesis.  May be TAVR related.   4. Left atrial size was moderately dilated.   5. Tricuspid valve regurgitation is moderate.   6. The mitral valve is abnormal. Trivial mitral valve regurgitation.    ASSESSMENT AND PLAN:  1.  Complete heart block: Occurred post TAVR.  Status post [facility] Jude dual-chamber pacemaker implanted 04/16/2020.  Sensing, threshold, impedance within normal limits.  No changes.  2.  Severe aortic stenosis: Status post TAVR.  Repeat echo with stable TAVR and small perimembranous VSD.  Plan per primary team.  3.  Nonobstructive coronary artery disease: Without chest pain.  Current medicines are reviewed at length with the patient today.   The patient does not have concerns regarding his medicines.  The following changes were made today:  none  Labs/ tests ordered today include:  Orders Placed This Encounter  Procedures   CUP PACEART INCLINIC DEVICE CHECK     Disposition:   FU 12 months  Signed, Helios Kohlmann Jorja Loa, MD  03/04/2023 4:21 PM     [facility] [address] [place] [address] [phone] (office) [phone] (fax)

## 2023-04-12 ENCOUNTER — Other Ambulatory Visit: Payer: Self-pay | Admitting: Cardiology

## 2023-04-13 ENCOUNTER — Ambulatory Visit (INDEPENDENT_AMBULATORY_CARE_PROVIDER_SITE_OTHER): Payer: PPO

## 2023-04-13 DIAGNOSIS — I442 Atrioventricular block, complete: Secondary | ICD-10-CM

## 2023-04-13 LAB — CUP PACEART REMOTE DEVICE CHECK
Battery Remaining Longevity: 88 mo
Battery Remaining Percentage: 73 %
Battery Voltage: 3.01 V
Brady Statistic AP VP Percent: 19 %
Brady Statistic AP VS Percent: 1 %
Brady Statistic AS VP Percent: 78 %
Brady Statistic AS VS Percent: 1.1 %
Brady Statistic RA Percent Paced: 16 %
Brady Statistic RV Percent Paced: 97 %
Date Time Interrogation Session: 20240605020012
Implantable Lead Connection Status: 753985
Implantable Lead Connection Status: 753985
Implantable Lead Implant Date: 20210609
Implantable Lead Implant Date: 20210609
Implantable Lead Location: 753859
Implantable Lead Location: 753860
Implantable Pulse Generator Implant Date: 20210609
Lead Channel Impedance Value: 410 Ohm
Lead Channel Impedance Value: 450 Ohm
Lead Channel Pacing Threshold Amplitude: 0.5 V
Lead Channel Pacing Threshold Amplitude: 0.75 V
Lead Channel Pacing Threshold Pulse Width: 0.4 ms
Lead Channel Pacing Threshold Pulse Width: 0.5 ms
Lead Channel Sensing Intrinsic Amplitude: 10.3 mV
Lead Channel Sensing Intrinsic Amplitude: 5 mV
Lead Channel Setting Pacing Amplitude: 1 V
Lead Channel Setting Pacing Amplitude: 2 V
Lead Channel Setting Pacing Pulse Width: 0.5 ms
Lead Channel Setting Sensing Sensitivity: 2 mV
Pulse Gen Model: 2272
Pulse Gen Serial Number: 3829143

## 2023-04-27 DIAGNOSIS — Z1339 Encounter for screening examination for other mental health and behavioral disorders: Secondary | ICD-10-CM | POA: Diagnosis not present

## 2023-04-27 DIAGNOSIS — Z299 Encounter for prophylactic measures, unspecified: Secondary | ICD-10-CM | POA: Diagnosis not present

## 2023-04-27 DIAGNOSIS — I25118 Atherosclerotic heart disease of native coronary artery with other forms of angina pectoris: Secondary | ICD-10-CM | POA: Diagnosis not present

## 2023-04-27 DIAGNOSIS — I10 Essential (primary) hypertension: Secondary | ICD-10-CM | POA: Diagnosis not present

## 2023-04-27 DIAGNOSIS — M5410 Radiculopathy, site unspecified: Secondary | ICD-10-CM | POA: Diagnosis not present

## 2023-04-27 DIAGNOSIS — Z0000 Encounter for general adult medical examination without abnormal findings: Secondary | ICD-10-CM | POA: Diagnosis not present

## 2023-04-27 DIAGNOSIS — M170 Bilateral primary osteoarthritis of knee: Secondary | ICD-10-CM | POA: Diagnosis not present

## 2023-04-27 DIAGNOSIS — M25362 Other instability, left knee: Secondary | ICD-10-CM | POA: Diagnosis not present

## 2023-04-27 DIAGNOSIS — Z7189 Other specified counseling: Secondary | ICD-10-CM | POA: Diagnosis not present

## 2023-04-27 DIAGNOSIS — Z1331 Encounter for screening for depression: Secondary | ICD-10-CM | POA: Diagnosis not present

## 2023-04-27 DIAGNOSIS — I700 Atherosclerosis of aorta: Secondary | ICD-10-CM | POA: Diagnosis not present

## 2023-04-27 DIAGNOSIS — M1712 Unilateral primary osteoarthritis, left knee: Secondary | ICD-10-CM | POA: Diagnosis not present

## 2023-05-06 NOTE — Progress Notes (Signed)
Remote pacemaker transmission.   

## 2023-05-10 DIAGNOSIS — L570 Actinic keratosis: Secondary | ICD-10-CM | POA: Diagnosis not present

## 2023-05-10 DIAGNOSIS — C4441 Basal cell carcinoma of skin of scalp and neck: Secondary | ICD-10-CM | POA: Diagnosis not present

## 2023-05-10 DIAGNOSIS — D0439 Carcinoma in situ of skin of other parts of face: Secondary | ICD-10-CM | POA: Diagnosis not present

## 2023-05-10 DIAGNOSIS — D0421 Carcinoma in situ of skin of right ear and external auricular canal: Secondary | ICD-10-CM | POA: Diagnosis not present

## 2023-05-10 DIAGNOSIS — D485 Neoplasm of uncertain behavior of skin: Secondary | ICD-10-CM | POA: Diagnosis not present

## 2023-05-16 DIAGNOSIS — M1712 Unilateral primary osteoarthritis, left knee: Secondary | ICD-10-CM | POA: Diagnosis not present

## 2023-05-23 DIAGNOSIS — M1712 Unilateral primary osteoarthritis, left knee: Secondary | ICD-10-CM | POA: Diagnosis not present

## 2023-05-26 ENCOUNTER — Ambulatory Visit: Payer: PPO | Attending: Cardiology

## 2023-05-26 DIAGNOSIS — Z952 Presence of prosthetic heart valve: Secondary | ICD-10-CM

## 2023-05-30 DIAGNOSIS — M1712 Unilateral primary osteoarthritis, left knee: Secondary | ICD-10-CM | POA: Diagnosis not present

## 2023-05-31 ENCOUNTER — Encounter: Payer: Self-pay | Admitting: Cardiology

## 2023-05-31 ENCOUNTER — Ambulatory Visit: Payer: PPO | Attending: Cardiology | Admitting: Cardiology

## 2023-05-31 VITALS — BP 122/70 | HR 57 | Ht 66.0 in | Wt 170.8 lb

## 2023-05-31 DIAGNOSIS — Z952 Presence of prosthetic heart valve: Secondary | ICD-10-CM | POA: Diagnosis not present

## 2023-05-31 DIAGNOSIS — I442 Atrioventricular block, complete: Secondary | ICD-10-CM

## 2023-05-31 DIAGNOSIS — Z950 Presence of cardiac pacemaker: Secondary | ICD-10-CM | POA: Diagnosis not present

## 2023-05-31 DIAGNOSIS — E782 Mixed hyperlipidemia: Secondary | ICD-10-CM | POA: Diagnosis not present

## 2023-05-31 DIAGNOSIS — I2510 Atherosclerotic heart disease of native coronary artery without angina pectoris: Secondary | ICD-10-CM | POA: Diagnosis not present

## 2023-05-31 LAB — ECHOCARDIOGRAM COMPLETE
AR max vel: 1.66 cm2
AV Area VTI: 1.62 cm2
AV Area mean vel: 1.74 cm2
AV Mean grad: 19 mmHg
AV Peak grad: 33.9 mmHg
Ao pk vel: 2.91 m/s
Area-P 1/2: 1.98 cm2
Calc EF: 62.6 %
MV VTI: 1.64 cm2
P 1/2 time: 558 msec
S' Lateral: 3 cm
Single Plane A2C EF: 62.7 %
Single Plane A4C EF: 66.1 %

## 2023-05-31 MED ORDER — ASPIRIN 81 MG PO TBEC: 81.0000 mg | DELAYED_RELEASE_TABLET | ORAL | Status: DC

## 2023-05-31 NOTE — Progress Notes (Signed)
Cardiology Office Note  Date: 05/31/2023   ID: Corey Aguilar, DOB January 24, 1934, MRN [id number]  History of Present Illness: Corey Aguilar is an 89 y.o. male last seen in January.  He is here for a routine visit.  Reports no major change in status.  No exertional chest pain, NYHA class II dyspnea with routine activity and ADLs.  He uses a cane to ambulate, no falls.  No palpitations or syncope.  St. Jude pacemaker in place with history of complete heart block, followed by Dr. Elberta Fortis.  Device interrogation in June revealed normal function.  We went over his medications.  He stopped baby aspirin daily due to easy bruising.  We discussed trying to take this every other day to see if he tolerates it better.  I reviewed his recent echocardiogram as noted below.  Physical Exam: VS:  BP 122/70   Pulse (!) 57   Ht 5\' 6"  (1.676 m)   Wt 170 lb 12.8 oz (77.5 kg)   SpO2 96%   BMI 27.57 kg/m , BMI Body mass index is 27.57 kg/m.  Wt Readings from Last 3 Encounters:  05/31/23 170 lb 12.8 oz (77.5 kg)  03/04/23 175 lb 6.4 oz (79.6 kg)  11/25/22 174 lb 14.4 oz (79.3 kg)    General: Patient appears comfortable at rest. HEENT: Conjunctiva and lids normal. Neck: Supple, no elevated JVP or carotid bruits. Lungs: Clear to auscultation, nonlabored breathing at rest. Cardiac: Regular rate and rhythm, no S3, 2/6 systolic murmur. Extremities: No pitting edema.  ECG:  An ECG dated 11/25/2022 was personally reviewed today and demonstrated:  Dual chamber pacing.  Labwork:  June 2024: Hemoglobin 13.9, platelets 177, BUN 23, creatinine 0.89, potassium 4.3, AST 17, ALT 7, cholesterol 150, triglycerides 78, HDL 51, LDL 84, TSH 1.92  Other Studies Reviewed Today:  Echocardiogram 05/26/2023:  1. Left ventricular ejection fraction, by estimation, is 60 to 65%. The  left ventricle has normal function. The left ventricle has no regional  wall motion abnormalities. There is mild concentric left  ventricular  hypertrophy. Left ventricular diastolic  parameters are consistent with Grade I diastolic dysfunction (impaired  relaxation).   2. Right ventricular systolic function is normal. The right ventricular  size is normal. Tricuspid regurgitation signal is inadequate for assessing  PA pressure.   3. Left atrial size was mildly dilated.   4. The mitral valve is degenerative. Mild mitral valve regurgitation.  Mild mitral stenosis. The mean mitral valve gradient is 5.0 mmHg. Moderate  to severe mitral annular calcification.   5. The aortic valve has been repaired/replaced. Aortic valve  regurgitation is mild. There is a 26 mm Sapien prosthetic (TAVR) valve  present in the aortic position. Aortic valve mean gradient measures 19.0  mmHg.   6. There is mild dilatation of the ascending aorta, measuring 39 mm.   7. Unable to estimate CVP.   Assessment and Plan:  1.  History of severe degenerative calcific aortic stenosis status post TAVR with 26 mm Edwards S3U in June 2021.  Recent follow-up echocardiogram shows LVEF 60 to 65%, TAVR with mild aortic regurgitation and increased mean gradient from 14 mmHg to 19 mmHg.  He will try aspirin every other day for now.  2.  Moderate nonobstructive CAD by cardiac catheterization in May 2021.  No active angina.  Continue observation on statin therapy.  3.  Mixed hyperlipidemia.  LDL 84 in June.  No change in current dose of Lipitor.  4.  Complete  heart block status post St. Jude pacemaker with follow-up by Dr. Elberta Fortis.  Disposition:  Follow up  6 months.  Signed, Jonelle Sidle, M.D., F.A.C.C. [facility]

## 2023-05-31 NOTE — Patient Instructions (Addendum)
Medication Instructions:  Your physician has recommended you make the following change in your medication:  Change aspirin 81 mg to three times per week Continue all other medications as prescribed  Labwork: none  Testing/Procedures: none  Follow-Up: Your physician recommends that you schedule a follow-up appointment in: 6 months  Any Other Special Instructions Will Be Listed Below (If Applicable).  If you need a refill on your cardiac medications before your next appointment, please call your pharmacy.

## 2023-06-07 DIAGNOSIS — R2233 Localized swelling, mass and lump, upper limb, bilateral: Secondary | ICD-10-CM | POA: Diagnosis not present

## 2023-06-07 DIAGNOSIS — Z299 Encounter for prophylactic measures, unspecified: Secondary | ICD-10-CM | POA: Diagnosis not present

## 2023-06-07 DIAGNOSIS — L0390 Cellulitis, unspecified: Secondary | ICD-10-CM | POA: Diagnosis not present

## 2023-06-07 DIAGNOSIS — I10 Essential (primary) hypertension: Secondary | ICD-10-CM | POA: Diagnosis not present

## 2023-06-10 DIAGNOSIS — G4700 Insomnia, unspecified: Secondary | ICD-10-CM | POA: Diagnosis not present

## 2023-06-10 DIAGNOSIS — G250 Essential tremor: Secondary | ICD-10-CM | POA: Diagnosis not present

## 2023-06-10 DIAGNOSIS — I110 Hypertensive heart disease with heart failure: Secondary | ICD-10-CM | POA: Diagnosis not present

## 2023-06-10 DIAGNOSIS — C4492 Squamous cell carcinoma of skin, unspecified: Secondary | ICD-10-CM | POA: Diagnosis not present

## 2023-06-10 DIAGNOSIS — I509 Heart failure, unspecified: Secondary | ICD-10-CM | POA: Diagnosis not present

## 2023-06-10 DIAGNOSIS — E785 Hyperlipidemia, unspecified: Secondary | ICD-10-CM | POA: Diagnosis not present

## 2023-06-10 DIAGNOSIS — D692 Other nonthrombocytopenic purpura: Secondary | ICD-10-CM | POA: Diagnosis not present

## 2023-06-10 DIAGNOSIS — G8929 Other chronic pain: Secondary | ICD-10-CM | POA: Diagnosis not present

## 2023-06-10 DIAGNOSIS — I442 Atrioventricular block, complete: Secondary | ICD-10-CM | POA: Diagnosis not present

## 2023-06-10 DIAGNOSIS — E669 Obesity, unspecified: Secondary | ICD-10-CM | POA: Diagnosis not present

## 2023-06-10 DIAGNOSIS — G5791 Unspecified mononeuropathy of right lower limb: Secondary | ICD-10-CM | POA: Diagnosis not present

## 2023-06-10 DIAGNOSIS — I70213 Atherosclerosis of native arteries of extremities with intermittent claudication, bilateral legs: Secondary | ICD-10-CM | POA: Diagnosis not present

## 2023-06-16 DIAGNOSIS — C4441 Basal cell carcinoma of skin of scalp and neck: Secondary | ICD-10-CM | POA: Diagnosis not present

## 2023-06-16 DIAGNOSIS — D040 Carcinoma in situ of skin of lip: Secondary | ICD-10-CM | POA: Diagnosis not present

## 2023-06-16 DIAGNOSIS — D485 Neoplasm of uncertain behavior of skin: Secondary | ICD-10-CM | POA: Diagnosis not present

## 2023-06-21 DIAGNOSIS — Z299 Encounter for prophylactic measures, unspecified: Secondary | ICD-10-CM | POA: Diagnosis not present

## 2023-06-21 DIAGNOSIS — I10 Essential (primary) hypertension: Secondary | ICD-10-CM | POA: Diagnosis not present

## 2023-06-21 DIAGNOSIS — M1712 Unilateral primary osteoarthritis, left knee: Secondary | ICD-10-CM | POA: Diagnosis not present

## 2023-06-21 DIAGNOSIS — I5189 Other ill-defined heart diseases: Secondary | ICD-10-CM | POA: Diagnosis not present

## 2023-06-21 DIAGNOSIS — I700 Atherosclerosis of aorta: Secondary | ICD-10-CM | POA: Diagnosis not present

## 2023-06-21 DIAGNOSIS — L0390 Cellulitis, unspecified: Secondary | ICD-10-CM | POA: Diagnosis not present

## 2023-06-30 DIAGNOSIS — C44329 Squamous cell carcinoma of skin of other parts of face: Secondary | ICD-10-CM | POA: Diagnosis not present

## 2023-07-13 ENCOUNTER — Ambulatory Visit (INDEPENDENT_AMBULATORY_CARE_PROVIDER_SITE_OTHER): Payer: PPO

## 2023-07-13 DIAGNOSIS — I442 Atrioventricular block, complete: Secondary | ICD-10-CM | POA: Diagnosis not present

## 2023-07-13 LAB — CUP PACEART REMOTE DEVICE CHECK
Battery Remaining Longevity: 83 mo
Battery Remaining Percentage: 70 %
Battery Voltage: 3.01 V
Brady Statistic AP VP Percent: 20 %
Brady Statistic AP VS Percent: 1 %
Brady Statistic AS VP Percent: 75 %
Brady Statistic AS VS Percent: 1.1 %
Brady Statistic RA Percent Paced: 16 %
Brady Statistic RV Percent Paced: 96 %
Date Time Interrogation Session: 20240904020013
Implantable Lead Connection Status: 753985
Implantable Lead Connection Status: 753985
Implantable Lead Implant Date: 20210609
Implantable Lead Implant Date: 20210609
Implantable Lead Location: 753859
Implantable Lead Location: 753860
Implantable Pulse Generator Implant Date: 20210609
Lead Channel Impedance Value: 390 Ohm
Lead Channel Impedance Value: 450 Ohm
Lead Channel Pacing Threshold Amplitude: 0.5 V
Lead Channel Pacing Threshold Amplitude: 0.75 V
Lead Channel Pacing Threshold Pulse Width: 0.4 ms
Lead Channel Pacing Threshold Pulse Width: 0.5 ms
Lead Channel Sensing Intrinsic Amplitude: 5 mV
Lead Channel Sensing Intrinsic Amplitude: 8.2 mV
Lead Channel Setting Pacing Amplitude: 1 V
Lead Channel Setting Pacing Amplitude: 2 V
Lead Channel Setting Pacing Pulse Width: 0.5 ms
Lead Channel Setting Sensing Sensitivity: 2 mV
Pulse Gen Model: 2272
Pulse Gen Serial Number: 3829143

## 2023-07-14 DIAGNOSIS — C4402 Squamous cell carcinoma of skin of lip: Secondary | ICD-10-CM | POA: Diagnosis not present

## 2023-07-22 DIAGNOSIS — M545 Low back pain, unspecified: Secondary | ICD-10-CM | POA: Diagnosis not present

## 2023-07-22 DIAGNOSIS — I10 Essential (primary) hypertension: Secondary | ICD-10-CM | POA: Diagnosis not present

## 2023-07-22 DIAGNOSIS — Z299 Encounter for prophylactic measures, unspecified: Secondary | ICD-10-CM | POA: Diagnosis not present

## 2023-07-22 DIAGNOSIS — M1712 Unilateral primary osteoarthritis, left knee: Secondary | ICD-10-CM | POA: Diagnosis not present

## 2023-07-26 NOTE — Progress Notes (Signed)
Remote pacemaker transmission.   

## 2023-08-18 DIAGNOSIS — I10 Essential (primary) hypertension: Secondary | ICD-10-CM | POA: Diagnosis not present

## 2023-08-18 DIAGNOSIS — M533 Sacrococcygeal disorders, not elsewhere classified: Secondary | ICD-10-CM | POA: Diagnosis not present

## 2023-08-18 DIAGNOSIS — Z299 Encounter for prophylactic measures, unspecified: Secondary | ICD-10-CM | POA: Diagnosis not present

## 2023-08-23 ENCOUNTER — Ambulatory Visit: Payer: PPO | Admitting: Dermatology

## 2023-09-01 DIAGNOSIS — M170 Bilateral primary osteoarthritis of knee: Secondary | ICD-10-CM | POA: Diagnosis not present

## 2023-09-01 DIAGNOSIS — G894 Chronic pain syndrome: Secondary | ICD-10-CM | POA: Diagnosis not present

## 2023-09-01 DIAGNOSIS — M5459 Other low back pain: Secondary | ICD-10-CM | POA: Diagnosis not present

## 2023-09-01 DIAGNOSIS — M51361 Other intervertebral disc degeneration, lumbar region with lower extremity pain only: Secondary | ICD-10-CM | POA: Diagnosis not present

## 2023-09-01 DIAGNOSIS — M48062 Spinal stenosis, lumbar region with neurogenic claudication: Secondary | ICD-10-CM | POA: Diagnosis not present

## 2023-09-01 DIAGNOSIS — M961 Postlaminectomy syndrome, not elsewhere classified: Secondary | ICD-10-CM | POA: Diagnosis not present

## 2023-09-01 DIAGNOSIS — M4726 Other spondylosis with radiculopathy, lumbar region: Secondary | ICD-10-CM | POA: Diagnosis not present

## 2023-09-01 DIAGNOSIS — M48061 Spinal stenosis, lumbar region without neurogenic claudication: Secondary | ICD-10-CM | POA: Diagnosis not present

## 2023-09-02 DIAGNOSIS — Z0000 Encounter for general adult medical examination without abnormal findings: Secondary | ICD-10-CM | POA: Diagnosis not present

## 2023-09-02 DIAGNOSIS — Z299 Encounter for prophylactic measures, unspecified: Secondary | ICD-10-CM | POA: Diagnosis not present

## 2023-09-02 DIAGNOSIS — I25118 Atherosclerotic heart disease of native coronary artery with other forms of angina pectoris: Secondary | ICD-10-CM | POA: Diagnosis not present

## 2023-09-02 DIAGNOSIS — R52 Pain, unspecified: Secondary | ICD-10-CM | POA: Diagnosis not present

## 2023-09-02 DIAGNOSIS — I5189 Other ill-defined heart diseases: Secondary | ICD-10-CM | POA: Diagnosis not present

## 2023-09-02 DIAGNOSIS — I10 Essential (primary) hypertension: Secondary | ICD-10-CM | POA: Diagnosis not present

## 2023-09-02 DIAGNOSIS — Z2821 Immunization not carried out because of patient refusal: Secondary | ICD-10-CM | POA: Diagnosis not present

## 2023-10-12 ENCOUNTER — Ambulatory Visit (INDEPENDENT_AMBULATORY_CARE_PROVIDER_SITE_OTHER): Payer: PPO

## 2023-10-12 DIAGNOSIS — M47816 Spondylosis without myelopathy or radiculopathy, lumbar region: Secondary | ICD-10-CM | POA: Diagnosis not present

## 2023-10-12 DIAGNOSIS — I442 Atrioventricular block, complete: Secondary | ICD-10-CM

## 2023-10-12 DIAGNOSIS — M48061 Spinal stenosis, lumbar region without neurogenic claudication: Secondary | ICD-10-CM | POA: Diagnosis not present

## 2023-10-12 LAB — CUP PACEART REMOTE DEVICE CHECK
Battery Remaining Longevity: 81 mo
Battery Remaining Percentage: 68 %
Battery Voltage: 3.01 V
Brady Statistic AP VP Percent: 19 %
Brady Statistic AP VS Percent: 1 %
Brady Statistic AS VP Percent: 77 %
Brady Statistic AS VS Percent: 1 %
Brady Statistic RA Percent Paced: 16 %
Brady Statistic RV Percent Paced: 96 %
Date Time Interrogation Session: 20241204020014
Implantable Lead Connection Status: 753985
Implantable Lead Connection Status: 753985
Implantable Lead Implant Date: 20210609
Implantable Lead Implant Date: 20210609
Implantable Lead Location: 753859
Implantable Lead Location: 753860
Implantable Pulse Generator Implant Date: 20210609
Lead Channel Impedance Value: 390 Ohm
Lead Channel Impedance Value: 450 Ohm
Lead Channel Pacing Threshold Amplitude: 0.5 V
Lead Channel Pacing Threshold Amplitude: 0.75 V
Lead Channel Pacing Threshold Pulse Width: 0.4 ms
Lead Channel Pacing Threshold Pulse Width: 0.5 ms
Lead Channel Sensing Intrinsic Amplitude: 5 mV
Lead Channel Sensing Intrinsic Amplitude: 8.5 mV
Lead Channel Setting Pacing Amplitude: 1 V
Lead Channel Setting Pacing Amplitude: 2 V
Lead Channel Setting Pacing Pulse Width: 0.5 ms
Lead Channel Setting Sensing Sensitivity: 2 mV
Pulse Gen Model: 2272
Pulse Gen Serial Number: 3829143

## 2023-11-03 DIAGNOSIS — M170 Bilateral primary osteoarthritis of knee: Secondary | ICD-10-CM | POA: Diagnosis not present

## 2023-11-17 ENCOUNTER — Telehealth: Payer: Self-pay | Admitting: *Deleted

## 2023-11-17 NOTE — Telephone Encounter (Signed)
 Patient scheduled to see Dr. Diona Browner on 11/28/23. Will add preop clearance to appt notes

## 2023-11-17 NOTE — Telephone Encounter (Signed)
   Pre-operative Risk Assessment    Patient Name: Corey Aguilar  DOB: 06-26-1934 MRN: [id number]   Date of last office visit: 05/31/2023 Date of next office visit: 11/28/2023   Request for Surgical Clearance    Procedure:   genicular radiofrequency ablation  Date of Surgery:  Clearance TBD                                Surgeon:  Dr. Alm Lower  Surgeon's Group or Practice Name:  [facility] in [place]  Phone number:  [phone] Fax number:  [phone]   Type of Clearance Requested:   - Medical    Type of Anesthesia:  Not Indicated   Additional requests/questions:    Bonney Sharman Potters   11/17/2023, 11:40 AM

## 2023-11-17 NOTE — Telephone Encounter (Signed)
   Name: Corey Aguilar  DOB: [birth year]  MRN: [id number]  Primary Cardiologist: Jayson Sierras, MD   Preoperative team, please contact this patient and set up a phone call appointment for further preoperative risk assessment. Please obtain consent and complete medication review. Thank you for your help.  Patient has a device.   I confirm that guidance regarding antiplatelet and oral anticoagulation therapy has been completed and, if necessary, noted below.  None requested.   I also confirmed the patient resides in the state of [place] . As per [facility] Medical Board telemedicine laws, the patient must reside in the state in which the provider is licensed.   Barnie Hila, NP 11/17/2023, 5:01 PM [facility]

## 2023-11-28 ENCOUNTER — Encounter: Payer: Self-pay | Admitting: Cardiology

## 2023-11-28 ENCOUNTER — Ambulatory Visit: Payer: PPO | Attending: Cardiology | Admitting: Cardiology

## 2023-11-28 VITALS — BP 138/84 | HR 77 | Ht 66.0 in | Wt 166.6 lb

## 2023-11-28 DIAGNOSIS — Z950 Presence of cardiac pacemaker: Secondary | ICD-10-CM | POA: Diagnosis not present

## 2023-11-28 DIAGNOSIS — Z952 Presence of prosthetic heart valve: Secondary | ICD-10-CM | POA: Diagnosis not present

## 2023-11-28 DIAGNOSIS — I2510 Atherosclerotic heart disease of native coronary artery without angina pectoris: Secondary | ICD-10-CM | POA: Diagnosis not present

## 2023-11-28 DIAGNOSIS — Z01810 Encounter for preprocedural cardiovascular examination: Secondary | ICD-10-CM

## 2023-11-28 DIAGNOSIS — E782 Mixed hyperlipidemia: Secondary | ICD-10-CM

## 2023-11-28 NOTE — Progress Notes (Signed)
Cardiology Office Note  Date: 11/28/2023   ID: Corey Aguilar, DOB Apr 15, 1934, MRN [id number]  History of Present Illness: Corey Aguilar is an 89 y.o. male last seen in July 2024.  He is here for a routine visit.  Records also indicate plan for genicular radiofrequency ablation by Dr. Laurian Brim for treatment of knee pain.  He does not report any palpitations, does feel lightheaded occasionally, but has had no syncope.  He is using a cane to ambulate, limited by bilateral knee pain.  St. Jude pacemaker in place with history of complete heart block, follows with Dr. Elberta Fortis.  Device interrogation in December 2024 indicated normal function.  I reviewed his medications.  He stopped taking aspirin completely due to easy bruising.  Otherwise remains on Toprol-XL and Lipitor.  I reviewed his ECG today which shows a ventricular paced rhythm with atrial sensing.  Physical Exam: VS:  BP 138/84   Pulse 77   Ht 5\' 6"  (1.676 m)   Wt 166 lb 9.6 oz (75.6 kg)   SpO2 98%   BMI 26.89 kg/m , BMI Body mass index is 26.89 kg/m.  Wt Readings from Last 3 Encounters:  11/28/23 166 lb 9.6 oz (75.6 kg)  05/31/23 170 lb 12.8 oz (77.5 kg)  03/04/23 175 lb 6.4 oz (79.6 kg)    General: Patient appears comfortable at rest. HEENT: Conjunctiva and lids normal, oropharynx clear with moist mucosa. Neck: Supple, no elevated JVP or carotid bruits, no thyromegaly. Lungs: Clear to auscultation, nonlabored breathing at rest. Cardiac: Regular rate and rhythm, no S3 or significant systolic murmur, no pericardial rub. Abdomen: Soft, nontender, no hepatomegaly, bowel sounds present, no guarding or rebound. Extremities: No pitting edema, distal pulses 2+. Skin: Warm and dry. Musculoskeletal: No kyphosis. Neuropsychiatric: Alert and oriented x3, affect grossly appropriate.  ECG:  An ECG dated 11/25/2022 was personally reviewed today and demonstrated:  Dual-chamber pacing.  Labwork:  June 2024: Hemoglobin 13.9,  platelets 177, BUN 23, creatinine 0.89, potassium 4.3, AST 17, ALT 7, cholesterol 150, triglycerides 78, HDL 51, LDL 84, TSH 1.92  Other Studies Reviewed Today:  Echocardiogram 05/26/2023:  1. Left ventricular ejection fraction, by estimation, is 60 to 65%. The  left ventricle has normal function. The left ventricle has no regional  wall motion abnormalities. There is mild concentric left ventricular  hypertrophy. Left ventricular diastolic  parameters are consistent with Grade I diastolic dysfunction (impaired  relaxation).   2. Right ventricular systolic function is normal. The right ventricular  size is normal. Tricuspid regurgitation signal is inadequate for assessing  PA pressure.   3. Left atrial size was mildly dilated.   4. The mitral valve is degenerative. Mild mitral valve regurgitation.  Mild mitral stenosis. The mean mitral valve gradient is 5.0 mmHg. Moderate  to severe mitral annular calcification.   5. The aortic valve has been repaired/replaced. Aortic valve  regurgitation is mild. There is a 26 mm Sapien prosthetic (TAVR) valve  present in the aortic position. Aortic valve mean gradient measures 19.0  mmHg.   6. There is mild dilatation of the ascending aorta, measuring 39 mm.   7. Unable to estimate CVP.   Assessment and Plan:  1.  Preprocedure cardiac evaluation prior to planned genicular radiofrequency ablation by Dr. Laurian Brim for treatment of knee pain (no anesthesia).  Overall low risk from a cardiac perspective.  Main question is whether this would result in any significant interference in patient's pacemaker that would require any additional monitoring.  I will communicate with Dr. Elberta Fortis for his recommendations.  2.  History of severe degenerative calcific aortic stenosis status post TAVR with 26 mm Edwards S3U in June 2021.  Follow-up echocardiogram in July 2024 shows LVEF 60 to 65%, TAVR with mild aortic regurgitation and mean gradient 19 mmHg.  He is  symptomatically stable.  He came off aspirin as discussed above due to easy bruising.   3.  Moderate nonobstructive CAD by cardiac catheterization in May 2021.  No active angina.  He continues on Lipitor.   4.  Mixed hyperlipidemia.  LDL 84 and HDL 51 in June 4403.  No change in current dose of Lipitor.   5.  Complete heart block status post St. Jude pacemaker with follow-up by Dr. Elberta Fortis.  Disposition:  Follow up  6 months.  Signed, Jonelle Sidle, M.D., F.A.C.C. [facility] at [facility]

## 2023-11-28 NOTE — Patient Instructions (Signed)
Medication Instructions:  Continue all current medications.   Labwork: none  Testing/Procedures: none  Follow-Up: 6 months   Any Other Special Instructions Will Be Listed Below (If Applicable).   If you need a refill on your cardiac medications before your next appointment, please call your pharmacy.  

## 2023-12-13 DIAGNOSIS — D485 Neoplasm of uncertain behavior of skin: Secondary | ICD-10-CM | POA: Diagnosis not present

## 2023-12-13 DIAGNOSIS — L570 Actinic keratosis: Secondary | ICD-10-CM | POA: Diagnosis not present

## 2023-12-13 DIAGNOSIS — L98499 Non-pressure chronic ulcer of skin of other sites with unspecified severity: Secondary | ICD-10-CM | POA: Diagnosis not present

## 2023-12-13 DIAGNOSIS — D0422 Carcinoma in situ of skin of left ear and external auricular canal: Secondary | ICD-10-CM | POA: Diagnosis not present

## 2023-12-21 DIAGNOSIS — Z299 Encounter for prophylactic measures, unspecified: Secondary | ICD-10-CM | POA: Diagnosis not present

## 2023-12-21 DIAGNOSIS — M1710 Unilateral primary osteoarthritis, unspecified knee: Secondary | ICD-10-CM | POA: Diagnosis not present

## 2023-12-21 DIAGNOSIS — I700 Atherosclerosis of aorta: Secondary | ICD-10-CM | POA: Diagnosis not present

## 2023-12-21 DIAGNOSIS — I10 Essential (primary) hypertension: Secondary | ICD-10-CM | POA: Diagnosis not present

## 2023-12-21 DIAGNOSIS — G4700 Insomnia, unspecified: Secondary | ICD-10-CM | POA: Diagnosis not present

## 2023-12-21 DIAGNOSIS — R52 Pain, unspecified: Secondary | ICD-10-CM | POA: Diagnosis not present

## 2023-12-21 DIAGNOSIS — R600 Localized edema: Secondary | ICD-10-CM | POA: Diagnosis not present

## 2023-12-22 ENCOUNTER — Telehealth: Payer: Self-pay | Admitting: Student

## 2023-12-22 NOTE — Telephone Encounter (Signed)
I s/w the pt's wife and stated the only request I see in the chart is for: genicular radiofrequency ablation. Pt's wife said yes that is it.    I confirmed with surgeon office the procedure genicular radiofrequency ablation. Procedure: A thin needle is inserted into the knee joint, near the genicular nerves (nerves that supply sensation to the knee).  Radiofrequency energy is delivered through the needle, which heats and damages the nerve tissue.  This interruption of nerve signals reduces pain.      I will forward this back to the preop APP for review.            Tag   Copy Andreas Blower    Telephone Encounter Signed   Creation Time: 12/22/2023 12:02 PM   Signed     Pt is scheduled to see Alejandro Mulling on 02/28/24. Pt is suppose to have knee surgery but they won't do surgery until he comes in and sees [employer]. Please advise

## 2023-12-22 NOTE — Telephone Encounter (Signed)
I s/w the pt's wife and stated the only request I see in the chart is for: genicular radiofrequency ablation. Pt's wife said yes that is it.   I confirmed with surgeon office the procedure genicular radiofrequency ablation. Procedure: A thin needle is inserted into the knee joint, near the genicular nerves (nerves that supply sensation to the knee).  Radiofrequency energy is delivered through the needle, which heats and damages the nerve tissue.  This interruption of nerve signals reduces pain.    I will forward this back to the preop APP for review.

## 2023-12-22 NOTE — Telephone Encounter (Signed)
I will forward this to device clinic for device clearance.

## 2023-12-22 NOTE — Telephone Encounter (Signed)
Pt is scheduled to see Alejandro Mulling on 02/28/24. Pt is suppose to have knee surgery but they won't do surgery until he comes in and sees [employer]. Please advise

## 2023-12-23 ENCOUNTER — Encounter: Payer: Self-pay | Admitting: Cardiology

## 2023-12-23 NOTE — Telephone Encounter (Signed)
Device clearance completed, in EPIC and routed to requested party.

## 2023-12-23 NOTE — Progress Notes (Signed)
PERIOPERATIVE PRESCRIPTION FOR IMPLANTED CARDIAC DEVICE PROGRAMMING  Patient Information: Name:  Corey Aguilar  DOB:  [birth year]  MRN:  [id number]  Patient Name: Corey Aguilar  DOB: 04-01-1934 MRN: [id number]   Date of last office visit: 05/31/2023 Date of next office visit: 11/28/2023   Request for Surgical Clearance    Procedure:   genicular radiofrequency ablation   Date of Surgery:  Clearance TBD                                  Surgeon:  Dr. Peggye Pitt  Surgeon's Group or Practice Name:  [facility] in [place]  Phone number:  [phone] Fax number:  [phone]   Type of Clearance Requested:   - Medical    Type of Anesthesia:  Not Indicated   Additional requests/questions:    Device Information:  Clinic EP Physician:  Loman Brooklyn, MD   Device Type:  Pacemaker Manufacturer and Phone #:  St. Jude/Abbott: [phone] Pacemaker Dependent?:  Yes.   Date of Last Device Check:  03/04/23 Normal Device Function?:  Yes.    Electrophysiologist's Recommendations:  Have magnet available. Provide continuous ECG monitoring when magnet is used or reprogramming is to be performed.  Procedure may interfere with device function.  Magnet should be placed over device during procedure.  Per Device Clinic Standing Orders, Skip Mayer, RN  9:57 AM 12/23/2023

## 2023-12-23 NOTE — Telephone Encounter (Signed)
   Patient Name: Corey Aguilar  DOB: [birth year] MRN: [id number]  Primary Cardiologist: Nona Dell, MD  Chart reviewed as part of pre-operative protocol coverage. Given past medical history and time since last visit, based on ACC/AHA guidelines, Corey Aguilar is at acceptable risk for the planned procedure without further cardiovascular testing.   The patient was advised that if he develops new symptoms prior to surgery to contact our office to arrange for a follow-up visit, and he verbalized understanding.  I will route this recommendation to the requesting party via Epic fax function and remove from pre-op pool.  Please call with questions.  Napoleon Form, Leodis Rains, NP 12/23/2023, 8:22 AM

## 2023-12-26 DIAGNOSIS — R609 Edema, unspecified: Secondary | ICD-10-CM | POA: Diagnosis not present

## 2023-12-26 DIAGNOSIS — R600 Localized edema: Secondary | ICD-10-CM | POA: Diagnosis not present

## 2023-12-29 DIAGNOSIS — C44229 Squamous cell carcinoma of skin of left ear and external auricular canal: Secondary | ICD-10-CM | POA: Diagnosis not present

## 2024-01-11 ENCOUNTER — Ambulatory Visit (INDEPENDENT_AMBULATORY_CARE_PROVIDER_SITE_OTHER): Payer: PPO

## 2024-01-11 DIAGNOSIS — I10 Essential (primary) hypertension: Secondary | ICD-10-CM | POA: Diagnosis not present

## 2024-01-11 DIAGNOSIS — I442 Atrioventricular block, complete: Secondary | ICD-10-CM | POA: Diagnosis not present

## 2024-01-11 DIAGNOSIS — Z299 Encounter for prophylactic measures, unspecified: Secondary | ICD-10-CM | POA: Diagnosis not present

## 2024-01-11 DIAGNOSIS — G250 Essential tremor: Secondary | ICD-10-CM | POA: Diagnosis not present

## 2024-01-11 DIAGNOSIS — M545 Low back pain, unspecified: Secondary | ICD-10-CM | POA: Diagnosis not present

## 2024-01-11 DIAGNOSIS — M7061 Trochanteric bursitis, right hip: Secondary | ICD-10-CM | POA: Diagnosis not present

## 2024-01-14 LAB — CUP PACEART REMOTE DEVICE CHECK
Battery Remaining Longevity: 79 mo
Battery Remaining Percentage: 65 %
Battery Voltage: 3.01 V
Brady Statistic AP VP Percent: 16 %
Brady Statistic AP VS Percent: 1 %
Brady Statistic AS VP Percent: 81 %
Brady Statistic AS VS Percent: 1 %
Brady Statistic RA Percent Paced: 14 %
Brady Statistic RV Percent Paced: 97 %
Date Time Interrogation Session: 20250305020017
Implantable Lead Connection Status: 753985
Implantable Lead Connection Status: 753985
Implantable Lead Implant Date: 20210609
Implantable Lead Implant Date: 20210609
Implantable Lead Location: 753859
Implantable Lead Location: 753860
Implantable Pulse Generator Implant Date: 20210609
Lead Channel Impedance Value: 400 Ohm
Lead Channel Impedance Value: 450 Ohm
Lead Channel Pacing Threshold Amplitude: 0.5 V
Lead Channel Pacing Threshold Amplitude: 0.75 V
Lead Channel Pacing Threshold Pulse Width: 0.4 ms
Lead Channel Pacing Threshold Pulse Width: 0.5 ms
Lead Channel Sensing Intrinsic Amplitude: 11.5 mV
Lead Channel Sensing Intrinsic Amplitude: 5 mV
Lead Channel Setting Pacing Amplitude: 1 V
Lead Channel Setting Pacing Amplitude: 2 V
Lead Channel Setting Pacing Pulse Width: 0.5 ms
Lead Channel Setting Sensing Sensitivity: 2 mV
Pulse Gen Model: 2272
Pulse Gen Serial Number: 3829143

## 2024-02-24 NOTE — Progress Notes (Signed)
 Remote pacemaker transmission.

## 2024-02-24 NOTE — Addendum Note (Signed)
 Addended by: Lott Rouleau A on: 02/24/2024 09:56 AM   Modules accepted: Orders

## 2024-02-28 ENCOUNTER — Encounter: Payer: Self-pay | Admitting: Student

## 2024-02-28 ENCOUNTER — Ambulatory Visit: Payer: PPO | Attending: Student | Admitting: Student

## 2024-02-28 VITALS — BP 142/82 | HR 68 | Ht 66.0 in | Wt 170.0 lb

## 2024-02-28 DIAGNOSIS — I442 Atrioventricular block, complete: Secondary | ICD-10-CM

## 2024-02-28 DIAGNOSIS — I2510 Atherosclerotic heart disease of native coronary artery without angina pectoris: Secondary | ICD-10-CM

## 2024-02-28 DIAGNOSIS — E782 Mixed hyperlipidemia: Secondary | ICD-10-CM | POA: Diagnosis not present

## 2024-02-28 DIAGNOSIS — Z952 Presence of prosthetic heart valve: Secondary | ICD-10-CM

## 2024-02-28 NOTE — Patient Instructions (Signed)
 Medication Instructions:  Your physician recommends that you continue on your current medications as directed. Please refer to the Current Medication list given to you today.  *If you need a refill on your cardiac medications before your next appointment, please call your pharmacy*  Lab Work: None ordered If you have labs (blood work) drawn today and your tests are completely normal, you will receive your results only by: MyChart Message (if you have MyChart) OR A paper copy in the mail If you have any lab test that is abnormal or we need to change your treatment, we will call you to review the results.  Follow-Up: At [facility], you and your health needs are our priority.  As part of our continuing mission to provide you with exceptional heart care, our providers are all part of one team.  This team includes your primary Cardiologist (physician) and Advanced Practice Providers or APPs (Physician Assistants and Nurse Practitioners) who all work together to provide you with the care you need, when you need it.  Your next appointment:   1 year(s)  Provider:   Casimiro Needle "Mardelle Matte" Lanna Poche, PA-C       1st Floor: - Lobby - Registration  - Pharmacy  - Lab - Cafe  2nd Floor: - PV Lab - Diagnostic Testing (echo, CT, nuclear med)  3rd Floor: - Vacant  4th Floor: - TCTS (cardiothoracic surgery) - AFib Clinic - Structural Heart Clinic - Vascular Surgery  - Vascular Ultrasound  5th Floor: - HeartCare Cardiology (general and EP) - Clinical Pharmacy for coumadin, hypertension, lipid, weight-loss medications, and med management appointments    Valet parking services will be available as well.

## 2024-02-28 NOTE — Progress Notes (Signed)
  Electrophysiology Office Note:   ID:  Corey Aguilar, DOB 25-Sep-1934, MRN [id number]  Primary Cardiologist: Teddie Favre, MD Electrophysiologist: Lei Pump, MD      History of Present Illness:   Corey Aguilar is a 89 y.o. male with h/o severe AS s/p TAVR, CHB s/p PPM, HLD and CAD seen today for routine electrophysiology followup.   Since last being seen in our clinic the patient reports doing OK from a cardiac perspective. Frustrated for length of time that clearance has taken. Discussed that feedback will be given, as medical and device clearance have already been completed.  Otherwise, he denies chest pain, palpitations, dyspnea, PND, orthopnea, nausea, vomiting, syncope, edema, weight gain, or early satiety.  He has mild dizziness at times with no clear aggravating factors. Can happen while seated and may be associated with flushing and "room spinning" sensation.   Review of systems complete and found to be negative unless listed in HPI.   EP Information / Studies Reviewed:    EKG is not ordered today. EKG from 11/28/2023 reviewed which showed AS-VP at 77 bpm       PPM Interrogation-  reviewed in detail today,  See PACEART report.  Arrhythmia/Device History Abbott Dual Chamber PPM 04/2020 for CHB s/p TAVR   Physical Exam:   VS:  BP (!) 142/82   Pulse 68   Ht 5\' 6"  (1.676 m)   Wt 170 lb (77.1 kg)   SpO2 94%   BMI 27.44 kg/m    Wt Readings from Last 3 Encounters:  02/28/24 170 lb (77.1 kg)  11/28/23 166 lb 9.6 oz (75.6 kg)  05/31/23 170 lb 12.8 oz (77.5 kg)     GEN: No acute distress  NECK: No JVD; No carotid bruits CARDIAC: Regular rate and rhythm, no murmurs, rubs, gallops RESPIRATORY:  Clear to auscultation without rales, wheezing or rhonchi  ABDOMEN: Soft, non-tender, non-distended EXTREMITIES:  No edema; No deformity   ASSESSMENT AND PLAN:    CHB s/p Abbott PPM  Device dependent Normal PPM function See Valeta Gaudier Art report No changes today  CAD   Denies s/s ischemia  Severe AS s/p TAVR Overall stable echo 05/2023  Dizziness Likely multifactorial. No episodes on device to correlate.   Cardiac Clearance for Genicular Radiofrequency Ablation Medically cleared by Dr. Londa Rival 11/28/2023 Device clearance sent 12/23/2023 -> Likely to interfere, recommendation for magnet placement for the duration of surgery.   Disposition:   Follow up with EP APP in 12 months  Signed, Tylene Galla, PA-C

## 2024-03-02 DIAGNOSIS — I25118 Atherosclerotic heart disease of native coronary artery with other forms of angina pectoris: Secondary | ICD-10-CM | POA: Diagnosis not present

## 2024-03-02 DIAGNOSIS — Z299 Encounter for prophylactic measures, unspecified: Secondary | ICD-10-CM | POA: Diagnosis not present

## 2024-03-02 DIAGNOSIS — Z0000 Encounter for general adult medical examination without abnormal findings: Secondary | ICD-10-CM | POA: Diagnosis not present

## 2024-03-02 DIAGNOSIS — Z1339 Encounter for screening examination for other mental health and behavioral disorders: Secondary | ICD-10-CM | POA: Diagnosis not present

## 2024-03-02 DIAGNOSIS — I700 Atherosclerosis of aorta: Secondary | ICD-10-CM | POA: Diagnosis not present

## 2024-03-02 DIAGNOSIS — Z1331 Encounter for screening for depression: Secondary | ICD-10-CM | POA: Diagnosis not present

## 2024-03-02 DIAGNOSIS — I10 Essential (primary) hypertension: Secondary | ICD-10-CM | POA: Diagnosis not present

## 2024-03-02 DIAGNOSIS — Z7189 Other specified counseling: Secondary | ICD-10-CM | POA: Diagnosis not present

## 2024-03-08 DIAGNOSIS — L720 Epidermal cyst: Secondary | ICD-10-CM | POA: Diagnosis not present

## 2024-03-19 DIAGNOSIS — H00015 Hordeolum externum left lower eyelid: Secondary | ICD-10-CM | POA: Diagnosis not present

## 2024-03-19 DIAGNOSIS — Z961 Presence of intraocular lens: Secondary | ICD-10-CM | POA: Diagnosis not present

## 2024-03-19 DIAGNOSIS — H0289 Other specified disorders of eyelid: Secondary | ICD-10-CM | POA: Diagnosis not present

## 2024-04-03 DIAGNOSIS — M47816 Spondylosis without myelopathy or radiculopathy, lumbar region: Secondary | ICD-10-CM | POA: Diagnosis not present

## 2024-04-03 DIAGNOSIS — M48061 Spinal stenosis, lumbar region without neurogenic claudication: Secondary | ICD-10-CM | POA: Diagnosis not present

## 2024-04-10 DIAGNOSIS — G588 Other specified mononeuropathies: Secondary | ICD-10-CM | POA: Diagnosis not present

## 2024-04-10 DIAGNOSIS — M51361 Other intervertebral disc degeneration, lumbar region with lower extremity pain only: Secondary | ICD-10-CM | POA: Diagnosis not present

## 2024-04-10 DIAGNOSIS — M961 Postlaminectomy syndrome, not elsewhere classified: Secondary | ICD-10-CM | POA: Diagnosis not present

## 2024-04-10 DIAGNOSIS — M4722 Other spondylosis with radiculopathy, cervical region: Secondary | ICD-10-CM | POA: Diagnosis not present

## 2024-04-10 DIAGNOSIS — M170 Bilateral primary osteoarthritis of knee: Secondary | ICD-10-CM | POA: Diagnosis not present

## 2024-04-10 DIAGNOSIS — G8929 Other chronic pain: Secondary | ICD-10-CM | POA: Diagnosis not present

## 2024-04-10 DIAGNOSIS — M48062 Spinal stenosis, lumbar region with neurogenic claudication: Secondary | ICD-10-CM | POA: Diagnosis not present

## 2024-04-10 DIAGNOSIS — Z79899 Other long term (current) drug therapy: Secondary | ICD-10-CM | POA: Diagnosis not present

## 2024-04-11 ENCOUNTER — Ambulatory Visit (INDEPENDENT_AMBULATORY_CARE_PROVIDER_SITE_OTHER): Payer: PPO

## 2024-04-11 DIAGNOSIS — I442 Atrioventricular block, complete: Secondary | ICD-10-CM | POA: Diagnosis not present

## 2024-04-11 LAB — CUP PACEART REMOTE DEVICE CHECK
Battery Remaining Longevity: 77 mo
Battery Remaining Percentage: 63 %
Battery Voltage: 3.01 V
Brady Statistic AP VP Percent: 15 %
Brady Statistic AP VS Percent: 1 %
Brady Statistic AS VP Percent: 83 %
Brady Statistic AS VS Percent: 1 %
Brady Statistic RA Percent Paced: 14 %
Brady Statistic RV Percent Paced: 98 %
Date Time Interrogation Session: 20250604020015
Implantable Lead Connection Status: 753985
Implantable Lead Connection Status: 753985
Implantable Lead Implant Date: 20210609
Implantable Lead Implant Date: 20210609
Implantable Lead Location: 753859
Implantable Lead Location: 753860
Implantable Pulse Generator Implant Date: 20210609
Lead Channel Impedance Value: 410 Ohm
Lead Channel Impedance Value: 490 Ohm
Lead Channel Pacing Threshold Amplitude: 0.5 V
Lead Channel Pacing Threshold Amplitude: 0.75 V
Lead Channel Pacing Threshold Pulse Width: 0.4 ms
Lead Channel Pacing Threshold Pulse Width: 0.5 ms
Lead Channel Sensing Intrinsic Amplitude: 12 mV
Lead Channel Sensing Intrinsic Amplitude: 5 mV
Lead Channel Setting Pacing Amplitude: 1 V
Lead Channel Setting Pacing Amplitude: 2 V
Lead Channel Setting Pacing Pulse Width: 0.5 ms
Lead Channel Setting Sensing Sensitivity: 2 mV
Pulse Gen Model: 2272
Pulse Gen Serial Number: 3829143

## 2024-04-13 ENCOUNTER — Other Ambulatory Visit: Payer: Self-pay

## 2024-04-13 MED ORDER — ATORVASTATIN CALCIUM 20 MG PO TABS: 20.0000 mg | ORAL_TABLET | Freq: Every day | ORAL | 2 refills | Status: DC

## 2024-04-20 ENCOUNTER — Ambulatory Visit: Payer: Self-pay | Admitting: Cardiology

## 2024-04-28 ENCOUNTER — Emergency Department (HOSPITAL_COMMUNITY)

## 2024-04-28 ENCOUNTER — Emergency Department (HOSPITAL_COMMUNITY)
Admission: EM | Admit: 2024-04-28 | Discharge: 2024-04-29 | Disposition: A | Discharge status: Home / Self Care | Attending: Emergency Medicine | Admitting: Emergency Medicine

## 2024-04-28 ENCOUNTER — Encounter (HOSPITAL_COMMUNITY): Payer: Self-pay | Admitting: Emergency Medicine

## 2024-04-28 ENCOUNTER — Other Ambulatory Visit: Payer: Self-pay

## 2024-04-28 DIAGNOSIS — W19XXXA Unspecified fall, initial encounter: Secondary | ICD-10-CM | POA: Diagnosis not present

## 2024-04-28 DIAGNOSIS — Z96642 Presence of left artificial hip joint: Secondary | ICD-10-CM | POA: Diagnosis not present

## 2024-04-28 DIAGNOSIS — Z79899 Other long term (current) drug therapy: Secondary | ICD-10-CM | POA: Diagnosis not present

## 2024-04-28 DIAGNOSIS — S5002XA Contusion of left elbow, initial encounter: Secondary | ICD-10-CM | POA: Diagnosis not present

## 2024-04-28 DIAGNOSIS — I2510 Atherosclerotic heart disease of native coronary artery without angina pectoris: Secondary | ICD-10-CM | POA: Diagnosis not present

## 2024-04-28 DIAGNOSIS — Z950 Presence of cardiac pacemaker: Secondary | ICD-10-CM | POA: Insufficient documentation

## 2024-04-28 DIAGNOSIS — I10 Essential (primary) hypertension: Secondary | ICD-10-CM | POA: Diagnosis not present

## 2024-04-28 DIAGNOSIS — Z981 Arthrodesis status: Secondary | ICD-10-CM | POA: Diagnosis not present

## 2024-04-28 DIAGNOSIS — M25552 Pain in left hip: Secondary | ICD-10-CM | POA: Diagnosis not present

## 2024-04-28 DIAGNOSIS — J439 Emphysema, unspecified: Secondary | ICD-10-CM | POA: Diagnosis not present

## 2024-04-28 DIAGNOSIS — S7002XA Contusion of left hip, initial encounter: Secondary | ICD-10-CM | POA: Insufficient documentation

## 2024-04-28 DIAGNOSIS — S199XXA Unspecified injury of neck, initial encounter: Secondary | ICD-10-CM | POA: Diagnosis not present

## 2024-04-28 DIAGNOSIS — S51012A Laceration without foreign body of left elbow, initial encounter: Secondary | ICD-10-CM | POA: Diagnosis not present

## 2024-04-28 DIAGNOSIS — R9089 Other abnormal findings on diagnostic imaging of central nervous system: Secondary | ICD-10-CM | POA: Diagnosis not present

## 2024-04-28 DIAGNOSIS — S0990XA Unspecified injury of head, initial encounter: Secondary | ICD-10-CM | POA: Diagnosis not present

## 2024-04-28 MED ORDER — FENTANYL CITRATE PF 50 MCG/ML IJ SOSY
50.0000 ug | PREFILLED_SYRINGE | Freq: Once | INTRAMUSCULAR | Status: AC
  Administered 2024-04-28: 50 ug via INTRAVENOUS
  Filled 2024-04-28: qty 1

## 2024-04-28 NOTE — ED Provider Notes (Signed)
 [place] [facility] DEPARTMENT AT [facility]  Provider Note  CSN: [id number] Arrival date & time: 04/28/24 2316  History Chief Complaint  Patient presents with   Corey Aguilar is a 89 y.o. male brought by EMS who helps supplement history. He has history of CAD, CHB s/p PPM and TAVR. Also has had left hip replacement and more recently (6/3) bilateral genicular nerve ablation with ortho in [place]. He reports tonight he went to the front door to let in the cat and when he turned to come back in he lost his balance and fell. He denies LOC. Unsure of head injury. Not on a blood thinner. Complaining mostly of L hip pain, worse with movement. Denies CP, SOB.    Home Medications Prior to Admission medications   Medication Sig Start Date End Date Taking? Authorizing Provider  acetaminophen  (TYLENOL ) 650 MG CR tablet Take 650 mg by mouth at bedtime as needed for pain (arthritis).    [provider]  atorvastatin  (LIPITOR) 20 MG tablet Take 1 tablet (20 mg total) by mouth daily. 04/13/24   Debera Jayson MATSU, MD  metoprolol  succinate (TOPROL -XL) 25 MG 24 hr tablet Take 12.5 mg by mouth daily. Take with or immediately following a meal.    [provider]     Allergies    Penicillins and Ibuprofen   Review of Systems   Review of Systems Please see HPI for pertinent positives and negatives  Physical Exam There were no vitals taken for this visit.  Physical Exam Vitals and nursing note reviewed.  Constitutional:      Appearance: Normal appearance.  HENT:     Head: Normocephalic and atraumatic.     Nose: Nose normal.     Mouth/Throat:     Mouth: Mucous membranes are moist.   Eyes:     Extraocular Movements: Extraocular movements intact.     Conjunctiva/sclera: Conjunctivae normal.   Neck:     Comments: In c-collar Cardiovascular:     Rate and Rhythm: Normal rate.  Pulmonary:     Effort: Pulmonary effort is normal.     Breath  sounds: Normal breath sounds.  Abdominal:     General: Abdomen is flat.     Palpations: Abdomen is soft.     Tenderness: There is no abdominal tenderness.   Musculoskeletal:        General: Tenderness (L lateral hip) present.     Comments: L leg is partially flexed vs shortened, exam limited by pain   Skin:    General: Skin is warm and dry.   Neurological:     General: No focal deficit present.     Mental Status: He is alert.   Psychiatric:        Mood and Affect: Mood normal.     ED Results / Procedures / Treatments   EKG None  Procedures Procedures  Medications Ordered in the ED Medications  fentaNYL  (SUBLIMAZE ) injection 50 mcg (has no administration in time range)    Initial Impression and Plan  Patient here with what sounds like mechanical fall, will check imaging of head/c-spine due to unclear injury and L hip. Basic labs and EKG. Pain meds for comfort.   ED Course       MDM Rules/Calculators/A&P Medical Decision Making Amount and/or Complexity of Data Reviewed Labs: ordered. Radiology: ordered.  Risk Prescription drug management.     Final Clinical Impression(s) / ED Diagnoses Final diagnoses:  None  Rx / DC Orders ED Discharge Orders     None

## 2024-04-28 NOTE — ED Triage Notes (Addendum)
 Pt bib EMS after losing his balance at home and falling. Pt unsure whether he hit his head or not ( C-collar in place). Denies LOC. C/o pain to L hip (has had previous hip replacement on same side). Skin tear noted to L arm (wrapped in Kerlex by EMS). EMS reports CBG of 119.

## 2024-04-29 DIAGNOSIS — Z981 Arthrodesis status: Secondary | ICD-10-CM | POA: Diagnosis not present

## 2024-04-29 DIAGNOSIS — R9089 Other abnormal findings on diagnostic imaging of central nervous system: Secondary | ICD-10-CM | POA: Diagnosis not present

## 2024-04-29 DIAGNOSIS — M25552 Pain in left hip: Secondary | ICD-10-CM | POA: Diagnosis not present

## 2024-04-29 DIAGNOSIS — J439 Emphysema, unspecified: Secondary | ICD-10-CM | POA: Diagnosis not present

## 2024-04-29 DIAGNOSIS — Z96642 Presence of left artificial hip joint: Secondary | ICD-10-CM | POA: Diagnosis not present

## 2024-04-29 DIAGNOSIS — S199XXA Unspecified injury of neck, initial encounter: Secondary | ICD-10-CM | POA: Diagnosis not present

## 2024-04-29 DIAGNOSIS — S0990XA Unspecified injury of head, initial encounter: Secondary | ICD-10-CM | POA: Diagnosis not present

## 2024-04-29 LAB — CBC WITH DIFFERENTIAL/PLATELET
Abs Immature Granulocytes: 0.04 10*3/uL (ref 0.00–0.07)
Basophils Absolute: 0 10*3/uL (ref 0.0–0.1)
Basophils Relative: 0 %
Eosinophils Absolute: 0.1 10*3/uL (ref 0.0–0.5)
Eosinophils Relative: 2 %
HCT: 39.3 % (ref 39.0–52.0)
Hemoglobin: 12.6 g/dL — ABNORMAL LOW (ref 13.0–17.0)
Immature Granulocytes: 1 %
Lymphocytes Relative: 16 %
Lymphs Abs: 1.1 10*3/uL (ref 0.7–4.0)
MCH: 31.2 pg (ref 26.0–34.0)
MCHC: 32.1 g/dL (ref 30.0–36.0)
MCV: 97.3 fL (ref 80.0–100.0)
Monocytes Absolute: 0.7 10*3/uL (ref 0.1–1.0)
Monocytes Relative: 10 %
Neutro Abs: 4.9 10*3/uL (ref 1.7–7.7)
Neutrophils Relative %: 71 %
Platelets: 125 10*3/uL — ABNORMAL LOW (ref 150–400)
RBC: 4.04 MIL/uL — ABNORMAL LOW (ref 4.22–5.81)
RDW: 13.8 % (ref 11.5–15.5)
WBC: 6.8 10*3/uL (ref 4.0–10.5)
nRBC: 0 % (ref 0.0–0.2)

## 2024-04-29 LAB — BASIC METABOLIC PANEL WITH GFR
Anion gap: 8 (ref 5–15)
BUN: 22 mg/dL (ref 8–23)
CO2: 27 mmol/L (ref 22–32)
Calcium: 8.7 mg/dL — ABNORMAL LOW (ref 8.9–10.3)
Chloride: 109 mmol/L (ref 98–111)
Creatinine, Ser: 0.92 mg/dL (ref 0.61–1.24)
GFR, Estimated: >60 mL/min (ref >60)
Glucose, Bld: 118 mg/dL — ABNORMAL HIGH (ref 70–99)
Potassium: 3.9 mmol/L (ref 3.5–5.1)
Sodium: 144 mmol/L (ref 135–145)

## 2024-04-29 NOTE — ED Notes (Signed)
Pt ambulated in hallway with walker.

## 2024-04-29 NOTE — ED Notes (Signed)
 Pt A&Ox4. Pain in left hip radiating down his left leg. Laceration to left elbow not actively bleeding.

## 2024-05-01 DIAGNOSIS — Z9181 History of falling: Secondary | ICD-10-CM | POA: Diagnosis not present

## 2024-05-01 DIAGNOSIS — M25562 Pain in left knee: Secondary | ICD-10-CM | POA: Diagnosis not present

## 2024-05-01 DIAGNOSIS — R2681 Unsteadiness on feet: Secondary | ICD-10-CM | POA: Diagnosis not present

## 2024-05-07 DIAGNOSIS — I442 Atrioventricular block, complete: Secondary | ICD-10-CM | POA: Diagnosis not present

## 2024-05-07 DIAGNOSIS — M47816 Spondylosis without myelopathy or radiculopathy, lumbar region: Secondary | ICD-10-CM | POA: Diagnosis not present

## 2024-05-07 DIAGNOSIS — Z952 Presence of prosthetic heart valve: Secondary | ICD-10-CM | POA: Diagnosis not present

## 2024-05-07 DIAGNOSIS — G8911 Acute pain due to trauma: Secondary | ICD-10-CM | POA: Diagnosis not present

## 2024-05-07 DIAGNOSIS — I447 Left bundle-branch block, unspecified: Secondary | ICD-10-CM | POA: Diagnosis not present

## 2024-05-07 DIAGNOSIS — M21371 Foot drop, right foot: Secondary | ICD-10-CM | POA: Diagnosis not present

## 2024-05-07 DIAGNOSIS — Z950 Presence of cardiac pacemaker: Secondary | ICD-10-CM | POA: Diagnosis not present

## 2024-05-07 DIAGNOSIS — M5126 Other intervertebral disc displacement, lumbar region: Secondary | ICD-10-CM | POA: Diagnosis not present

## 2024-05-07 DIAGNOSIS — I119 Hypertensive heart disease without heart failure: Secondary | ICD-10-CM | POA: Diagnosis not present

## 2024-05-07 DIAGNOSIS — M51369 Other intervertebral disc degeneration, lumbar region without mention of lumbar back pain or lower extremity pain: Secondary | ICD-10-CM | POA: Diagnosis not present

## 2024-05-07 DIAGNOSIS — Z9181 History of falling: Secondary | ICD-10-CM | POA: Diagnosis not present

## 2024-05-07 DIAGNOSIS — S7002XD Contusion of left hip, subsequent encounter: Secondary | ICD-10-CM | POA: Diagnosis not present

## 2024-05-07 DIAGNOSIS — I700 Atherosclerosis of aorta: Secondary | ICD-10-CM | POA: Diagnosis not present

## 2024-05-07 DIAGNOSIS — I25118 Atherosclerotic heart disease of native coronary artery with other forms of angina pectoris: Secondary | ICD-10-CM | POA: Diagnosis not present

## 2024-05-07 DIAGNOSIS — Z96642 Presence of left artificial hip joint: Secondary | ICD-10-CM | POA: Diagnosis not present

## 2024-05-07 DIAGNOSIS — D692 Other nonthrombocytopenic purpura: Secondary | ICD-10-CM | POA: Diagnosis not present

## 2024-05-07 DIAGNOSIS — E7800 Pure hypercholesterolemia, unspecified: Secondary | ICD-10-CM | POA: Diagnosis not present

## 2024-05-21 DIAGNOSIS — I119 Hypertensive heart disease without heart failure: Secondary | ICD-10-CM | POA: Diagnosis not present

## 2024-05-21 DIAGNOSIS — E7800 Pure hypercholesterolemia, unspecified: Secondary | ICD-10-CM | POA: Diagnosis not present

## 2024-05-21 DIAGNOSIS — I25118 Atherosclerotic heart disease of native coronary artery with other forms of angina pectoris: Secondary | ICD-10-CM | POA: Diagnosis not present

## 2024-05-21 DIAGNOSIS — G8911 Acute pain due to trauma: Secondary | ICD-10-CM | POA: Diagnosis not present

## 2024-06-04 NOTE — Progress Notes (Signed)
 Remote pacemaker transmission.

## 2024-07-05 DIAGNOSIS — M48061 Spinal stenosis, lumbar region without neurogenic claudication: Secondary | ICD-10-CM | POA: Diagnosis not present

## 2024-07-05 DIAGNOSIS — M47816 Spondylosis without myelopathy or radiculopathy, lumbar region: Secondary | ICD-10-CM | POA: Diagnosis not present

## 2024-07-06 DIAGNOSIS — Z299 Encounter for prophylactic measures, unspecified: Secondary | ICD-10-CM | POA: Diagnosis not present

## 2024-07-06 DIAGNOSIS — R2681 Unsteadiness on feet: Secondary | ICD-10-CM | POA: Diagnosis not present

## 2024-07-06 DIAGNOSIS — R52 Pain, unspecified: Secondary | ICD-10-CM | POA: Diagnosis not present

## 2024-07-06 DIAGNOSIS — I10 Essential (primary) hypertension: Secondary | ICD-10-CM | POA: Diagnosis not present

## 2024-07-10 DIAGNOSIS — R2681 Unsteadiness on feet: Secondary | ICD-10-CM | POA: Diagnosis not present

## 2024-07-11 ENCOUNTER — Ambulatory Visit (INDEPENDENT_AMBULATORY_CARE_PROVIDER_SITE_OTHER): Payer: PPO

## 2024-07-11 DIAGNOSIS — I442 Atrioventricular block, complete: Secondary | ICD-10-CM | POA: Diagnosis not present

## 2024-07-12 ENCOUNTER — Ambulatory Visit: Payer: Self-pay | Admitting: Cardiology

## 2024-07-12 LAB — CUP PACEART REMOTE DEVICE CHECK
Battery Remaining Longevity: 74 mo
Battery Remaining Percentage: 60 %
Battery Voltage: 2.99 V
Brady Statistic AP VP Percent: 17 %
Brady Statistic AP VS Percent: 1 %
Brady Statistic AS VP Percent: 81 %
Brady Statistic AS VS Percent: 1 %
Brady Statistic RA Percent Paced: 16 %
Brady Statistic RV Percent Paced: 98 %
Date Time Interrogation Session: 20250903020014
Implantable Lead Connection Status: 753985
Implantable Lead Connection Status: 753985
Implantable Lead Implant Date: 20210609
Implantable Lead Implant Date: 20210609
Implantable Lead Location: 753859
Implantable Lead Location: 753860
Implantable Pulse Generator Implant Date: 20210609
Lead Channel Impedance Value: 410 Ohm
Lead Channel Impedance Value: 490 Ohm
Lead Channel Pacing Threshold Amplitude: 0.5 V
Lead Channel Pacing Threshold Amplitude: 0.75 V
Lead Channel Pacing Threshold Pulse Width: 0.4 ms
Lead Channel Pacing Threshold Pulse Width: 0.5 ms
Lead Channel Sensing Intrinsic Amplitude: 5 mV
Lead Channel Sensing Intrinsic Amplitude: 8.5 mV
Lead Channel Setting Pacing Amplitude: 1 V
Lead Channel Setting Pacing Amplitude: 2 V
Lead Channel Setting Pacing Pulse Width: 0.5 ms
Lead Channel Setting Sensing Sensitivity: 2 mV
Pulse Gen Model: 2272
Pulse Gen Serial Number: 3829143

## 2024-07-17 NOTE — Progress Notes (Signed)
 Remote PPM Transmission

## 2024-08-02 DIAGNOSIS — M4726 Other spondylosis with radiculopathy, lumbar region: Secondary | ICD-10-CM | POA: Diagnosis not present

## 2024-08-02 DIAGNOSIS — R29898 Other symptoms and signs involving the musculoskeletal system: Secondary | ICD-10-CM | POA: Diagnosis not present

## 2024-08-02 DIAGNOSIS — M51362 Other intervertebral disc degeneration, lumbar region with discogenic back pain and lower extremity pain: Secondary | ICD-10-CM | POA: Diagnosis not present

## 2024-08-02 DIAGNOSIS — M461 Sacroiliitis, not elsewhere classified: Secondary | ICD-10-CM | POA: Diagnosis not present

## 2024-08-02 DIAGNOSIS — M21371 Foot drop, right foot: Secondary | ICD-10-CM | POA: Diagnosis not present

## 2024-08-02 DIAGNOSIS — M48061 Spinal stenosis, lumbar region without neurogenic claudication: Secondary | ICD-10-CM | POA: Diagnosis not present

## 2024-08-14 ENCOUNTER — Encounter: Payer: Self-pay | Admitting: Cardiology

## 2024-08-14 ENCOUNTER — Ambulatory Visit: Attending: Cardiology | Admitting: Cardiology

## 2024-08-14 VITALS — BP 130/76 | HR 74 | Ht 66.0 in | Wt 165.0 lb

## 2024-08-14 DIAGNOSIS — Z950 Presence of cardiac pacemaker: Secondary | ICD-10-CM

## 2024-08-14 DIAGNOSIS — I2510 Atherosclerotic heart disease of native coronary artery without angina pectoris: Secondary | ICD-10-CM

## 2024-08-14 DIAGNOSIS — R5383 Other fatigue: Secondary | ICD-10-CM | POA: Diagnosis not present

## 2024-08-14 DIAGNOSIS — Z79899 Other long term (current) drug therapy: Secondary | ICD-10-CM | POA: Diagnosis not present

## 2024-08-14 DIAGNOSIS — Z125 Encounter for screening for malignant neoplasm of prostate: Secondary | ICD-10-CM | POA: Diagnosis not present

## 2024-08-14 DIAGNOSIS — I10 Essential (primary) hypertension: Secondary | ICD-10-CM | POA: Diagnosis not present

## 2024-08-14 DIAGNOSIS — Z952 Presence of prosthetic heart valve: Secondary | ICD-10-CM | POA: Diagnosis not present

## 2024-08-14 DIAGNOSIS — E7800 Pure hypercholesterolemia, unspecified: Secondary | ICD-10-CM | POA: Diagnosis not present

## 2024-08-14 DIAGNOSIS — R52 Pain, unspecified: Secondary | ICD-10-CM | POA: Diagnosis not present

## 2024-08-14 DIAGNOSIS — Z0000 Encounter for general adult medical examination without abnormal findings: Secondary | ICD-10-CM | POA: Diagnosis not present

## 2024-08-14 DIAGNOSIS — Z299 Encounter for prophylactic measures, unspecified: Secondary | ICD-10-CM | POA: Diagnosis not present

## 2024-08-14 NOTE — Patient Instructions (Addendum)

## 2024-08-14 NOTE — Progress Notes (Signed)
    Cardiology Office Note  Date: 08/14/2024   ID: Corey Aguilar, DOB 1933/12/24, MRN [id number]  History of Present Illness: Corey Aguilar is an 89 y.o. male last seen in January.  He had interval follow-up with EP in April.  He is here for a routine visit.  From a cardiac perspective, he does not report any exertional chest pain and has had stable NYHA class II dyspnea.  No palpitations or syncope.  He is using a walker to ambulate, did have a fall in the interim but fortunately no broken bones.  I reviewed his medications, he reports compliance with current regimen and continues to follow with Dr. Maree.  I reviewed interval lab work.  St. Jude pacemaker in place with history of complete heart block, follows with Dr. Inocencio.  Device interrogation in September revealed normal function.  Discussed getting an updated echocardiogram.  Physical Exam: VS:  BP 130/76 (BP Location: Left Arm)   Pulse 74   Ht 5' 6 (1.676 m)   Wt 165 lb (74.8 kg)   SpO2 93%   BMI 26.63 kg/m , BMI Body mass index is 26.63 kg/m.  Wt Readings from Last 3 Encounters:  08/14/24 165 lb (74.8 kg)  04/28/24 165 lb (74.8 kg)  02/28/24 170 lb (77.1 kg)    General: Patient appears comfortable at rest. HEENT: Conjunctiva and lids normal. Neck: Supple, no elevated JVP or carotid bruits. Lungs: Clear to auscultation, nonlabored breathing at rest. Cardiac: Regular rate and rhythm, no S3  1/6 systolic murmur. Extremities: No pitting edema.  ECG:  An ECG dated 04/28/2024 was personally reviewed today and demonstrated:  Ventricular pacing with atrial sensing of sinus rhythm.  Labwork: June 2024: Cholesterol 150, triglycerides 78, HDL 51, LDL 84 04/28/2024: BUN 22; Creatinine, Ser 0.92; Hemoglobin 12.6; Platelets 125; Potassium 3.9; Sodium 144   Other Studies Reviewed Today:  No interval cardiac testing for review today.  Assessment and Plan:  1.  History of severe degenerative calcific aortic stenosis  status post TAVR with 26 mm Edwards S3U in June 2021.  Follow-up echocardiogram in July 2024 shows LVEF 60 to 65%, TAVR with mild aortic regurgitation and mean gradient 19 mmHg.  Symptomatically stable with NYHA class II dyspnea.  Plan to update echocardiogram this year.  No longer on aspirin  given easy bruising.   2.  Moderate nonobstructive CAD by cardiac catheterization in May 2021.  He does not report any angina at current level of activity.  Continue Lipitor 20 mg daily.   3.  Complete heart block status post St. Jude pacemaker with follow-up by Dr. Inocencio.  Disposition:  Follow up 6 months.  Signed, Jayson JUDITHANN Sierras, M.D., F.A.C.C. [facility] at [facility]

## 2024-08-20 DIAGNOSIS — M461 Sacroiliitis, not elsewhere classified: Secondary | ICD-10-CM | POA: Diagnosis not present

## 2024-08-28 ENCOUNTER — Other Ambulatory Visit

## 2024-09-03 ENCOUNTER — Ambulatory Visit: Attending: Cardiology

## 2024-09-03 ENCOUNTER — Other Ambulatory Visit: Payer: Self-pay | Admitting: Cardiology

## 2024-09-03 ENCOUNTER — Ambulatory Visit: Payer: Self-pay | Admitting: Cardiology

## 2024-09-03 DIAGNOSIS — I350 Nonrheumatic aortic (valve) stenosis: Secondary | ICD-10-CM

## 2024-09-03 DIAGNOSIS — I2510 Atherosclerotic heart disease of native coronary artery without angina pectoris: Secondary | ICD-10-CM

## 2024-09-03 DIAGNOSIS — Z952 Presence of prosthetic heart valve: Secondary | ICD-10-CM

## 2024-09-03 DIAGNOSIS — Z950 Presence of cardiac pacemaker: Secondary | ICD-10-CM

## 2024-09-03 LAB — ECHOCARDIOGRAM COMPLETE
AR max vel: 1.37 cm2
AV Area VTI: 1.28 cm2
AV Area mean vel: 1.23 cm2
AV Mean grad: 18 mmHg
AV Peak grad: 28.9 mmHg
Ao pk vel: 2.69 m/s
Area-P 1/2: 1.43 cm2
Calc EF: 64.2 %
MV VTI: 1.69 cm2
S' Lateral: 3.4 cm
Single Plane A2C EF: 55 %
Single Plane A4C EF: 72.9 %

## 2024-09-06 ENCOUNTER — Other Ambulatory Visit (HOSPITAL_BASED_OUTPATIENT_CLINIC_OR_DEPARTMENT_OTHER): Payer: Self-pay

## 2024-09-06 ENCOUNTER — Telehealth: Payer: Self-pay | Admitting: Cardiology

## 2024-09-06 NOTE — Telephone Encounter (Signed)
 Pt c/o medication issue:  1. Name of Medication:   atorvastatin  (LIPITOR) 20 MG tablet    2. How are you currently taking this medication (dosage and times per day)?   Take 1 tablet (20 mg total) by mouth daily.    3. Are you having a reaction (difficulty breathing--STAT)? No  4. What is your medication issue?  Pts wife calling in regard to a national atorvastatin  recall. She would like to know if he should continue taking medication and if he needs to take another medication instead. Please advise.

## 2024-09-06 NOTE — Telephone Encounter (Signed)
 Advised to contact his pharmacy to determine if he received the specific affected recalled lot number for atorvastatin . Advised that we do have atorvastatin  available at [facility]. Verbalized understanding.

## 2024-10-10 ENCOUNTER — Ambulatory Visit: Payer: PPO

## 2024-10-10 DIAGNOSIS — I2510 Atherosclerotic heart disease of native coronary artery without angina pectoris: Secondary | ICD-10-CM

## 2024-10-11 ENCOUNTER — Ambulatory Visit: Payer: Self-pay | Admitting: Cardiology

## 2024-10-11 LAB — CUP PACEART REMOTE DEVICE CHECK
Battery Remaining Longevity: 67 mo
Battery Remaining Percentage: 58 %
Battery Voltage: 2.99 V
Brady Statistic AP VP Percent: 18 %
Brady Statistic AP VS Percent: 1 %
Brady Statistic AS VP Percent: 80 %
Brady Statistic AS VS Percent: 1 %
Brady Statistic RA Percent Paced: 17 %
Brady Statistic RV Percent Paced: 98 %
Date Time Interrogation Session: 20251203020013
Implantable Lead Connection Status: 753985
Implantable Lead Connection Status: 753985
Implantable Lead Implant Date: 20210609
Implantable Lead Implant Date: 20210609
Implantable Lead Location: 753859
Implantable Lead Location: 753860
Implantable Pulse Generator Implant Date: 20210609
Lead Channel Impedance Value: 400 Ohm
Lead Channel Impedance Value: 460 Ohm
Lead Channel Pacing Threshold Amplitude: 0.5 V
Lead Channel Pacing Threshold Amplitude: 0.875 V
Lead Channel Pacing Threshold Pulse Width: 0.4 ms
Lead Channel Pacing Threshold Pulse Width: 0.5 ms
Lead Channel Sensing Intrinsic Amplitude: 5 mV
Lead Channel Sensing Intrinsic Amplitude: 9.1 mV
Lead Channel Setting Pacing Amplitude: 1.125
Lead Channel Setting Pacing Amplitude: 2 V
Lead Channel Setting Pacing Pulse Width: 0.5 ms
Lead Channel Setting Sensing Sensitivity: 2 mV
Pulse Gen Model: 2272
Pulse Gen Serial Number: 3829143

## 2024-10-16 NOTE — Progress Notes (Signed)
 Remote PPM Transmission

## 2024-10-21 ENCOUNTER — Other Ambulatory Visit: Payer: Self-pay | Admitting: Cardiology

## 2024-11-20 ENCOUNTER — Encounter: Payer: Self-pay | Admitting: Cardiology

## 2024-11-20 ENCOUNTER — Telehealth (HOSPITAL_BASED_OUTPATIENT_CLINIC_OR_DEPARTMENT_OTHER): Payer: Self-pay | Admitting: *Deleted

## 2024-11-20 NOTE — Telephone Encounter (Signed)
"  ° °  Primary Cardiologist: Jayson Sierras, MD  Chart reviewed as part of pre-operative protocol coverage. Given past medical history and time since last visit, based on ACC/AHA guidelines, Corey Aguilar would be at acceptable risk for the planned procedure without further cardiovascular testing.   Patient should contact our office if he is having new symptoms that are concerning from a cardiac perspective to arrange a follow-up appointment.    I will route this recommendation to the requesting party via Epic fax function and remove from pre-op pool.  Please call with questions.  Rosaline EMERSON Bane, NP-C 11/20/2024, 12:55 PM [address] [place], [place] [address] Office [phone] Fax [phone]    "

## 2024-11-20 NOTE — Progress Notes (Signed)
 PERIOPERATIVE PRESCRIPTION FOR IMPLANTED CARDIAC DEVICE PROGRAMMING  Patient Information: Name:  ADON GEHLHAUSEN  DOB:  01/30/1934  MRN:  [id number]   Procedure:  Bilateral Genicular RFA   Date of Surgery:  Clearance 12/11/24                                Surgeon:  Dr. Alm Pencil Surgeon's Group or Practice Name:  [facility]  Phone number:  [phone] Fax number:  [phone]  Device Information:  Clinic EP Physician:  Soyla Norton, MD   Device Type:  Pacemaker Manufacturer and Phone #:  St. Jude/Abbott: [phone] Pacemaker Dependent?:  Yes.   Date of Last Device Check:  10/10/2024  Normal Device Function?:  Yes.    Electrophysiologist's Recommendations:  Have magnet available. Provide continuous ECG monitoring when magnet is used or reprogramming is to be performed.  Procedure may interfere with device function.  Magnet should be placed over device during procedure.  Per Device Clinic [address], [place] JONELLE [place], [place]  11:42 AM 11/20/2024

## 2024-11-20 NOTE — Telephone Encounter (Signed)
 [facility]  is calling back she said she's not sure what anesthesia but on the last note Dr. Lacretia used Versed  and Fentanyl  plus 2% lidocaine  injection in his knee.

## 2024-11-20 NOTE — Telephone Encounter (Signed)
 Please call to clarify type of anesthesia for procedure.  Thank you, Rosaline

## 2024-11-20 NOTE — Telephone Encounter (Signed)
 Reached out to the requesting office for clarification on anesthesia. Waiting for a response back.

## 2024-11-20 NOTE — Telephone Encounter (Signed)
"  ° °  Pre-operative Risk Assessment    Patient Name: Corey Aguilar  DOB: 03-Jul-1934 MRN: [id number]   Date of last office visit: 08/14/2024 Date of next office visit: 01/29/2025  Request for Surgical Clearance    Procedure:  Bilateral Genicular RFA  Date of Surgery:  Clearance 12/11/24                                 Surgeon:  Dr. Alm Pencil Surgeon's Group or Practice Name:  [facility]  Phone number:  [phone] Fax number:  [phone]   Type of Clearance Requested:   - Medical    Type of Anesthesia:  Not Indicated   Additional requests/questions:  Sent to device to review.   Signed, Edsel Grayce Sanders   11/20/2024, 11:31 AM   "

## 2025-01-09 ENCOUNTER — Ambulatory Visit

## 2025-01-29 ENCOUNTER — Ambulatory Visit: Admitting: Cardiology

## 2025-04-10 ENCOUNTER — Ambulatory Visit

## 2025-07-10 ENCOUNTER — Ambulatory Visit

## 2025-10-09 ENCOUNTER — Ambulatory Visit
